# Patient Record
Sex: Female | Born: 1935 | Race: White | Hispanic: No | Smoking: Never smoker
Health system: Southern US, Community
[De-identification: ages and names within clinical notes are randomized; demographics above are authoritative.]

## PROBLEM LIST (undated history)

## (undated) DIAGNOSIS — IMO0002 Reserved for concepts with insufficient information to code with codable children: Secondary | ICD-10-CM

## (undated) DIAGNOSIS — E785 Hyperlipidemia, unspecified: Secondary | ICD-10-CM

## (undated) DIAGNOSIS — E039 Hypothyroidism, unspecified: Secondary | ICD-10-CM

## (undated) DIAGNOSIS — R32 Unspecified urinary incontinence: Secondary | ICD-10-CM

## (undated) DIAGNOSIS — C73 Malignant neoplasm of thyroid gland: Secondary | ICD-10-CM

## (undated) DIAGNOSIS — M129 Arthropathy, unspecified: Secondary | ICD-10-CM

## (undated) DIAGNOSIS — C719 Malignant neoplasm of brain, unspecified: Secondary | ICD-10-CM

## (undated) DIAGNOSIS — I6350 Cerebral infarction due to unspecified occlusion or stenosis of unspecified cerebral artery: Secondary | ICD-10-CM

## (undated) DIAGNOSIS — I10 Essential (primary) hypertension: Secondary | ICD-10-CM

## (undated) DIAGNOSIS — IMO0001 Reserved for inherently not codable concepts without codable children: Secondary | ICD-10-CM

## (undated) DIAGNOSIS — F3289 Other specified depressive episodes: Secondary | ICD-10-CM

## (undated) DIAGNOSIS — N182 Chronic kidney disease, stage 2 (mild): Secondary | ICD-10-CM

## (undated) DIAGNOSIS — K279 Peptic ulcer, site unspecified, unspecified as acute or chronic, without hemorrhage or perforation: Secondary | ICD-10-CM

## (undated) DIAGNOSIS — K219 Gastro-esophageal reflux disease without esophagitis: Secondary | ICD-10-CM

## (undated) DIAGNOSIS — F329 Major depressive disorder, single episode, unspecified: Secondary | ICD-10-CM

## (undated) HISTORY — DX: Reserved for concepts with insufficient information to code with codable children: IMO0002

## (undated) HISTORY — PX: ABDOMINAL HYSTERECTOMY: SHX81

## (undated) HISTORY — PX: BLADDER SUSPENSION: SHX72

## (undated) HISTORY — DX: Major depressive disorder, single episode, unspecified: F32.9

## (undated) HISTORY — PX: CHOLECYSTECTOMY: SHX55

## (undated) HISTORY — DX: Essential (primary) hypertension: I10

## (undated) HISTORY — DX: Peptic ulcer, site unspecified, unspecified as acute or chronic, without hemorrhage or perforation: K27.9

## (undated) HISTORY — PX: THYROIDECTOMY: SHX17

## (undated) HISTORY — DX: Hypothyroidism, unspecified: E03.9

## (undated) HISTORY — DX: Other specified depressive episodes: F32.89

## (undated) HISTORY — DX: Malignant neoplasm of thyroid gland: C73

## (undated) HISTORY — DX: Hyperlipidemia, unspecified: E78.5

## (undated) HISTORY — PX: TONSILLECTOMY: SUR1361

## (undated) HISTORY — PX: APPENDECTOMY: SHX54

## (undated) HISTORY — DX: Reserved for inherently not codable concepts without codable children: IMO0001

## (undated) HISTORY — PX: BACK SURGERY: SHX140

## (undated) HISTORY — PX: BREAST BIOPSY: SHX20

## (undated) HISTORY — DX: Gastro-esophageal reflux disease without esophagitis: K21.9

## (undated) HISTORY — PX: INCONTINENCE SURGERY: SHX676

## (undated) HISTORY — PX: WRIST SURGERY: SHX841

## (undated) HISTORY — DX: Unspecified urinary incontinence: R32

## (undated) HISTORY — DX: Cerebral infarction due to unspecified occlusion or stenosis of unspecified cerebral artery: I63.50

## (undated) HISTORY — DX: Arthropathy, unspecified: M12.9

---

## 1996-08-27 DIAGNOSIS — I6350 Cerebral infarction due to unspecified occlusion or stenosis of unspecified cerebral artery: Secondary | ICD-10-CM

## 1996-08-27 HISTORY — DX: Cerebral infarction due to unspecified occlusion or stenosis of unspecified cerebral artery: I63.50

## 1998-10-24 ENCOUNTER — Other Ambulatory Visit: Admission: RE | Admit: 1998-10-24 | Discharge: 1998-10-24 | Payer: Self-pay | Admitting: *Deleted

## 1999-07-23 ENCOUNTER — Emergency Department (HOSPITAL_COMMUNITY): Admission: EM | Admit: 1999-07-23 | Discharge: 1999-07-24 | Payer: Self-pay

## 1999-10-27 ENCOUNTER — Other Ambulatory Visit: Admission: RE | Admit: 1999-10-27 | Discharge: 1999-10-27 | Payer: Self-pay | Admitting: *Deleted

## 2000-01-10 ENCOUNTER — Ambulatory Visit (HOSPITAL_COMMUNITY): Admission: RE | Admit: 2000-01-10 | Discharge: 2000-01-10 | Payer: Self-pay | Admitting: Urology

## 2000-01-10 ENCOUNTER — Encounter: Payer: Self-pay | Admitting: Urology

## 2000-05-30 ENCOUNTER — Encounter: Admission: RE | Admit: 2000-05-30 | Discharge: 2000-05-30 | Payer: Self-pay | Admitting: Family Medicine

## 2000-05-30 ENCOUNTER — Encounter: Payer: Self-pay | Admitting: Family Medicine

## 2000-10-28 ENCOUNTER — Other Ambulatory Visit: Admission: RE | Admit: 2000-10-28 | Discharge: 2000-10-28 | Payer: Self-pay | Admitting: *Deleted

## 2000-12-06 ENCOUNTER — Encounter: Admission: RE | Admit: 2000-12-06 | Discharge: 2000-12-06 | Payer: Self-pay | Admitting: Family Medicine

## 2000-12-06 ENCOUNTER — Encounter: Payer: Self-pay | Admitting: Family Medicine

## 2002-01-07 ENCOUNTER — Other Ambulatory Visit: Admission: RE | Admit: 2002-01-07 | Discharge: 2002-01-07 | Payer: Self-pay | Admitting: *Deleted

## 2002-02-06 ENCOUNTER — Encounter: Payer: Self-pay | Admitting: Orthopedic Surgery

## 2002-02-06 ENCOUNTER — Encounter: Admission: RE | Admit: 2002-02-06 | Discharge: 2002-02-06 | Payer: Self-pay | Admitting: Orthopedic Surgery

## 2002-04-03 ENCOUNTER — Encounter: Admission: RE | Admit: 2002-04-03 | Discharge: 2002-04-03 | Payer: Self-pay | Admitting: Gastroenterology

## 2002-04-03 ENCOUNTER — Encounter: Payer: Self-pay | Admitting: Gastroenterology

## 2002-05-06 ENCOUNTER — Encounter: Payer: Self-pay | Admitting: Family Medicine

## 2002-05-06 ENCOUNTER — Encounter: Admission: RE | Admit: 2002-05-06 | Discharge: 2002-05-06 | Payer: Self-pay | Admitting: Family Medicine

## 2002-05-28 ENCOUNTER — Encounter: Payer: Self-pay | Admitting: Neurology

## 2002-05-28 ENCOUNTER — Ambulatory Visit (HOSPITAL_COMMUNITY): Admission: RE | Admit: 2002-05-28 | Discharge: 2002-05-28 | Payer: Self-pay | Admitting: Neurology

## 2003-01-13 ENCOUNTER — Other Ambulatory Visit: Admission: RE | Admit: 2003-01-13 | Discharge: 2003-01-13 | Payer: Self-pay | Admitting: Obstetrics and Gynecology

## 2003-01-26 HISTORY — PX: US ECHOCARDIOGRAPHY: HXRAD669

## 2003-01-27 HISTORY — PX: CARDIOVASCULAR STRESS TEST: SHX262

## 2005-03-21 ENCOUNTER — Other Ambulatory Visit: Admission: RE | Admit: 2005-03-21 | Discharge: 2005-03-21 | Payer: Self-pay | Admitting: *Deleted

## 2005-03-30 ENCOUNTER — Inpatient Hospital Stay (HOSPITAL_BASED_OUTPATIENT_CLINIC_OR_DEPARTMENT_OTHER): Admission: RE | Admit: 2005-03-30 | Discharge: 2005-03-30 | Payer: Self-pay | Admitting: Cardiovascular Disease

## 2005-03-30 HISTORY — PX: CARDIAC CATHETERIZATION: SHX172

## 2005-05-18 ENCOUNTER — Emergency Department (HOSPITAL_COMMUNITY): Admission: EM | Admit: 2005-05-18 | Discharge: 2005-05-18 | Payer: Self-pay | Admitting: Emergency Medicine

## 2005-09-21 ENCOUNTER — Encounter (INDEPENDENT_AMBULATORY_CARE_PROVIDER_SITE_OTHER): Payer: Self-pay | Admitting: Specialist

## 2005-09-21 ENCOUNTER — Inpatient Hospital Stay (HOSPITAL_COMMUNITY): Admission: RE | Admit: 2005-09-21 | Discharge: 2005-09-23 | Payer: Self-pay | Admitting: Obstetrics and Gynecology

## 2005-10-22 ENCOUNTER — Encounter: Admission: RE | Admit: 2005-10-22 | Discharge: 2005-10-22 | Payer: Self-pay | Admitting: Orthopedic Surgery

## 2006-10-15 ENCOUNTER — Ambulatory Visit (HOSPITAL_BASED_OUTPATIENT_CLINIC_OR_DEPARTMENT_OTHER): Admission: RE | Admit: 2006-10-15 | Discharge: 2006-10-15 | Payer: Self-pay | Admitting: Surgery

## 2006-10-15 ENCOUNTER — Encounter (INDEPENDENT_AMBULATORY_CARE_PROVIDER_SITE_OTHER): Payer: Self-pay | Admitting: *Deleted

## 2007-04-04 ENCOUNTER — Encounter: Admission: RE | Admit: 2007-04-04 | Discharge: 2007-04-04 | Payer: Self-pay | Admitting: Orthopedic Surgery

## 2007-06-30 ENCOUNTER — Inpatient Hospital Stay (HOSPITAL_COMMUNITY): Admission: RE | Admit: 2007-06-30 | Discharge: 2007-07-02 | Payer: Self-pay | Admitting: Orthopedic Surgery

## 2008-06-30 ENCOUNTER — Encounter: Payer: Self-pay | Admitting: Emergency Medicine

## 2008-06-30 ENCOUNTER — Inpatient Hospital Stay (HOSPITAL_COMMUNITY): Admission: EM | Admit: 2008-06-30 | Discharge: 2008-07-03 | Payer: Self-pay | Admitting: Internal Medicine

## 2008-08-24 ENCOUNTER — Other Ambulatory Visit: Admission: RE | Admit: 2008-08-24 | Discharge: 2008-08-24 | Payer: Self-pay | Admitting: Interventional Radiology

## 2008-08-24 ENCOUNTER — Encounter (INDEPENDENT_AMBULATORY_CARE_PROVIDER_SITE_OTHER): Payer: Self-pay | Admitting: Interventional Radiology

## 2008-08-24 ENCOUNTER — Encounter: Admission: RE | Admit: 2008-08-24 | Discharge: 2008-08-24 | Payer: Self-pay | Admitting: Surgery

## 2008-09-27 DIAGNOSIS — C73 Malignant neoplasm of thyroid gland: Secondary | ICD-10-CM

## 2008-09-27 HISTORY — DX: Malignant neoplasm of thyroid gland: C73

## 2008-10-01 ENCOUNTER — Encounter (INDEPENDENT_AMBULATORY_CARE_PROVIDER_SITE_OTHER): Payer: Self-pay | Admitting: Surgery

## 2008-10-01 ENCOUNTER — Ambulatory Visit (HOSPITAL_COMMUNITY): Admission: RE | Admit: 2008-10-01 | Discharge: 2008-10-03 | Payer: Self-pay | Admitting: Surgery

## 2008-10-15 ENCOUNTER — Encounter (HOSPITAL_COMMUNITY): Admission: RE | Admit: 2008-10-15 | Discharge: 2008-12-31 | Payer: Self-pay | Admitting: Surgery

## 2009-02-13 ENCOUNTER — Emergency Department (HOSPITAL_BASED_OUTPATIENT_CLINIC_OR_DEPARTMENT_OTHER): Admission: EM | Admit: 2009-02-13 | Discharge: 2009-02-13 | Payer: Self-pay | Admitting: Emergency Medicine

## 2009-03-16 HISTORY — PX: US ECHOCARDIOGRAPHY: HXRAD669

## 2009-04-25 ENCOUNTER — Encounter (HOSPITAL_COMMUNITY): Admission: RE | Admit: 2009-04-25 | Discharge: 2009-05-26 | Payer: Self-pay | Admitting: Surgery

## 2009-05-27 ENCOUNTER — Encounter: Admission: RE | Admit: 2009-05-27 | Discharge: 2009-05-27 | Payer: Self-pay | Admitting: Gastroenterology

## 2009-06-06 ENCOUNTER — Encounter: Admission: RE | Admit: 2009-06-06 | Discharge: 2009-06-06 | Payer: Self-pay | Admitting: Orthopedic Surgery

## 2009-08-11 ENCOUNTER — Emergency Department (HOSPITAL_COMMUNITY): Admission: EM | Admit: 2009-08-11 | Discharge: 2009-08-12 | Payer: Self-pay | Admitting: Emergency Medicine

## 2009-11-04 IMAGING — RF DG ESOPHAGUS
9 series · 18 of 18 positions shown · non-contrast
Comparison: None

CLINICAL DATA: ESOPHOGRAM/BARIUM SWALLOW
TECHNIQUE: Combined double contrast and single contrast
examination performed using effervescent crystals, thick barium
liquid, and thin barium liquid.

Fluoroscopy time:  2.3 minutes.

[Series 1: run · 3 of 3 slices shown (1 of 9)]
[im 1/3]
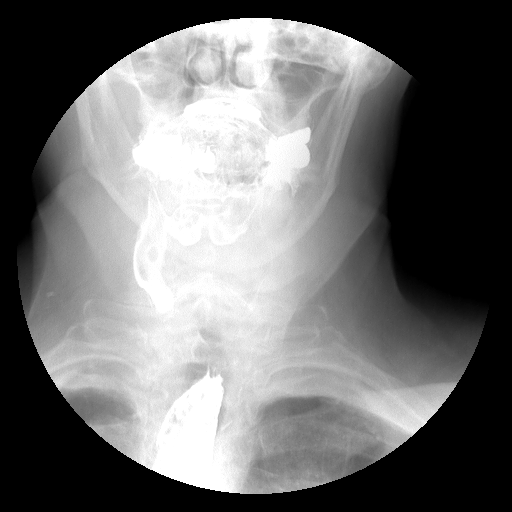
[im 2/3]
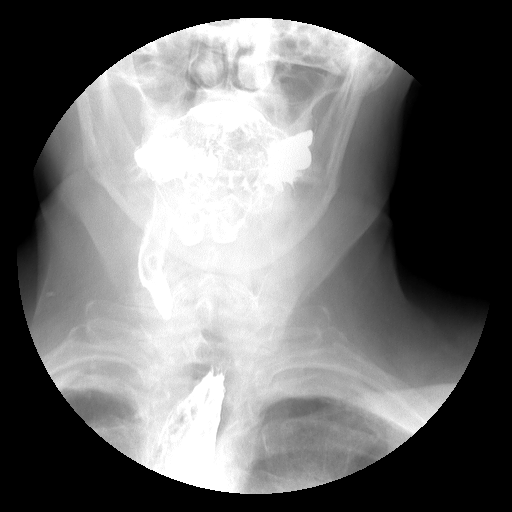
[im 3/3]
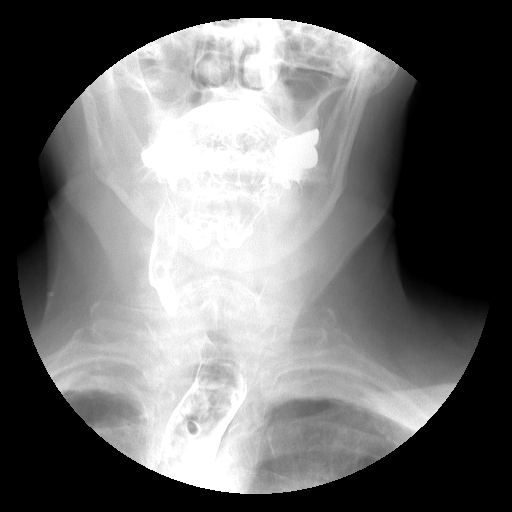

[Series 2: run · 2 of 2 slices shown (2 of 9)]
[im 1/2]
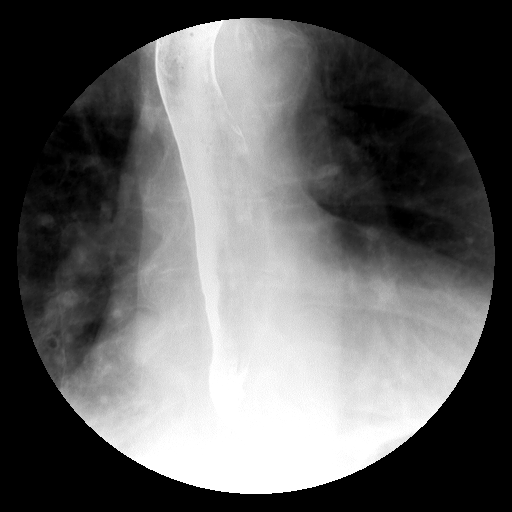
[im 2/2]
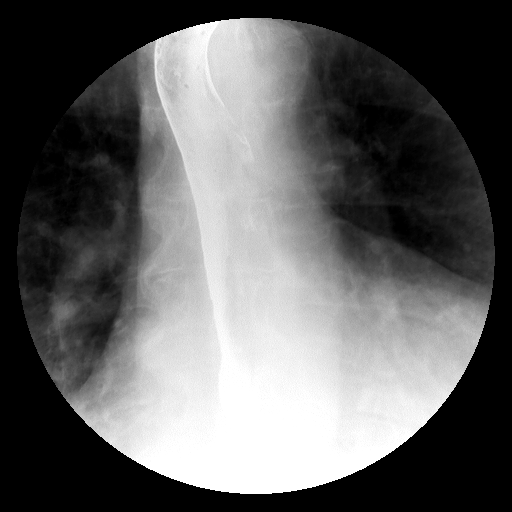

[Series 3: run · 2 of 2 slices shown (3 of 9)]
[im 1/2]
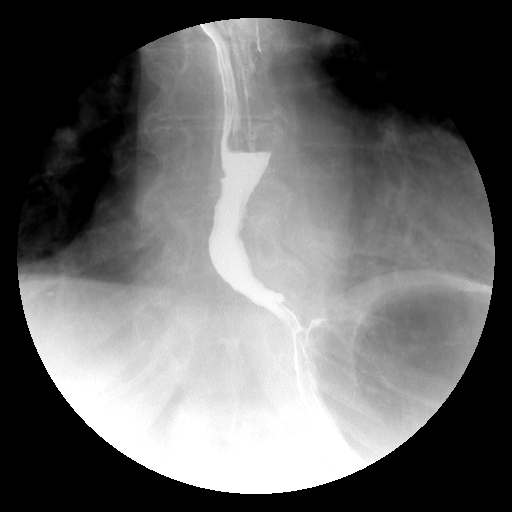
[im 2/2]
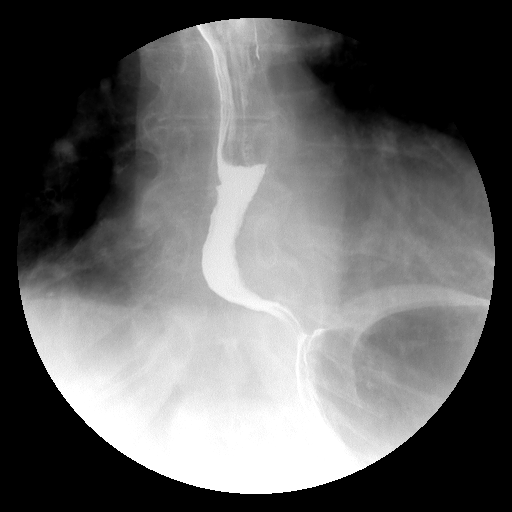

[Series 4: run · 2 of 2 slices shown (4 of 9)]
[im 1/2]
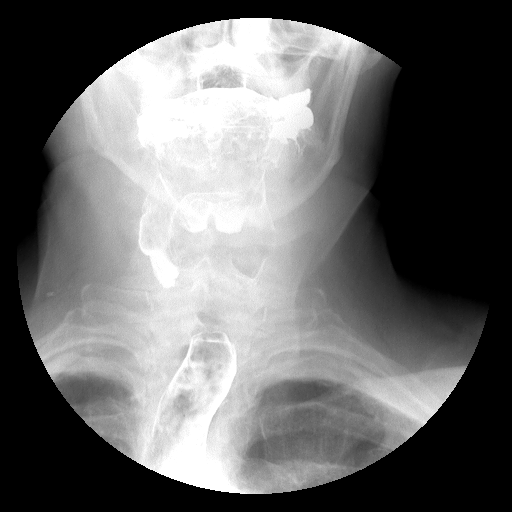
[im 2/2]
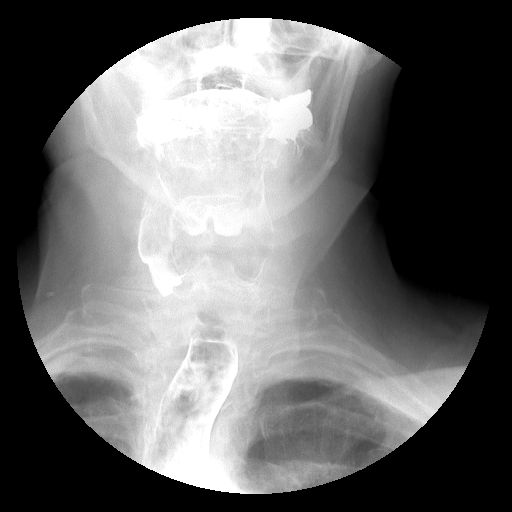

[Series 5: run · 1 of 1 slices shown (5 of 9)]
[im 1/1]
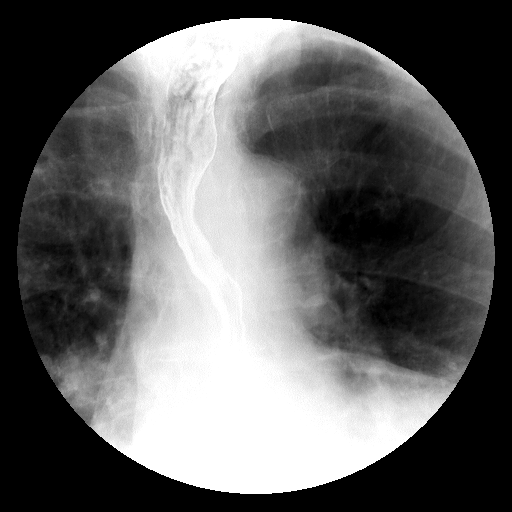

[Series 6: run · 2 of 2 slices shown (6 of 9)]
[im 1/2]
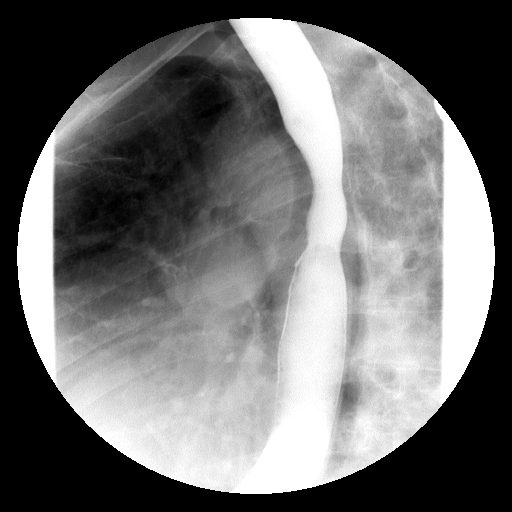
[im 2/2]
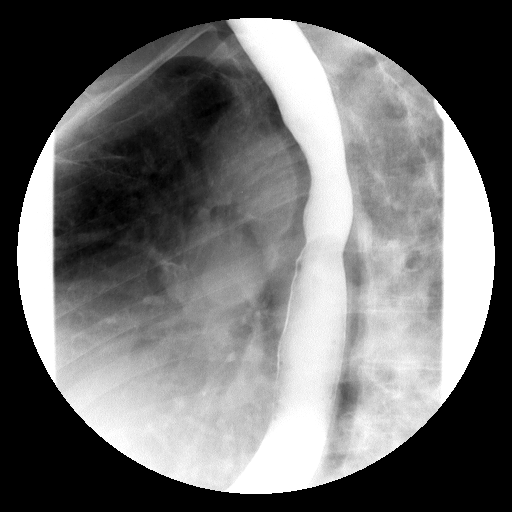

[Series 7: run · 3 of 3 slices shown (7 of 9)]
[im 1/3]
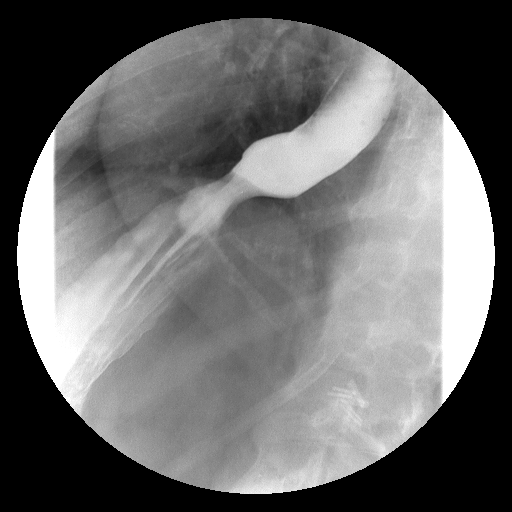
[im 2/3]
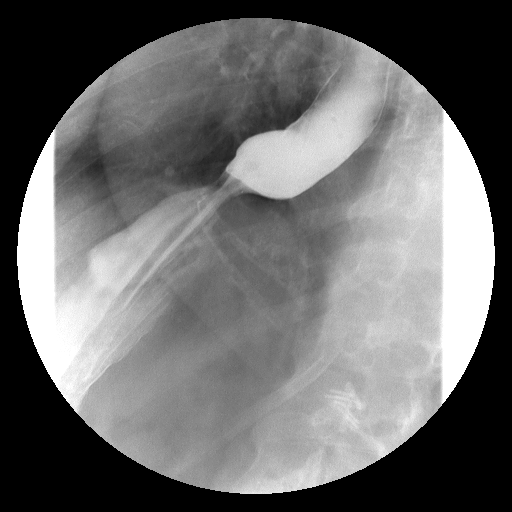
[im 3/3]
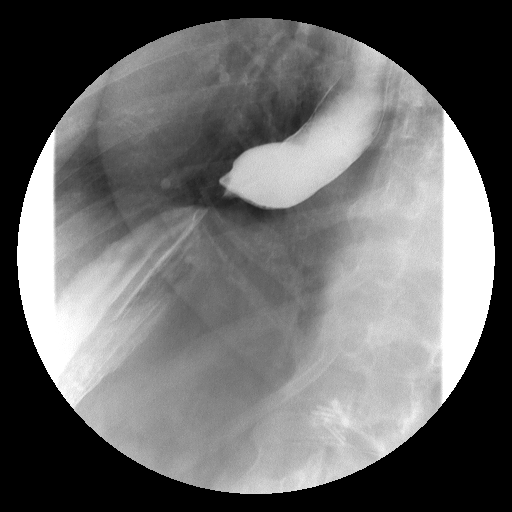

[Series 8: run · 1 of 1 slices shown (8 of 9)]
[im 1/1]
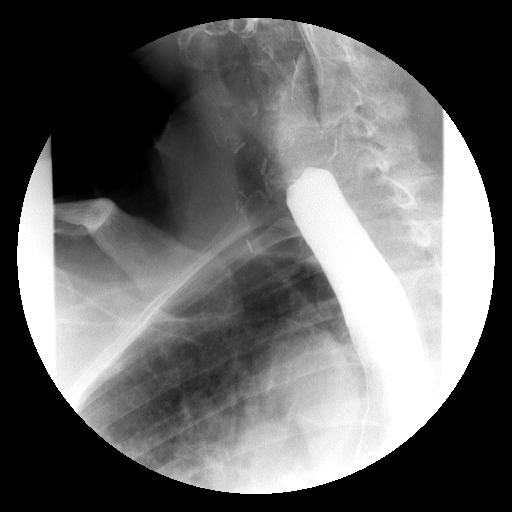

[Series 9: run · 2 of 2 slices shown (9 of 9)]
[im 1/2]
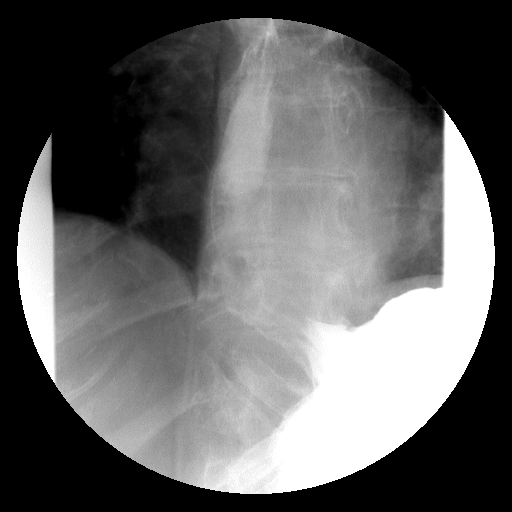
[im 2/2]
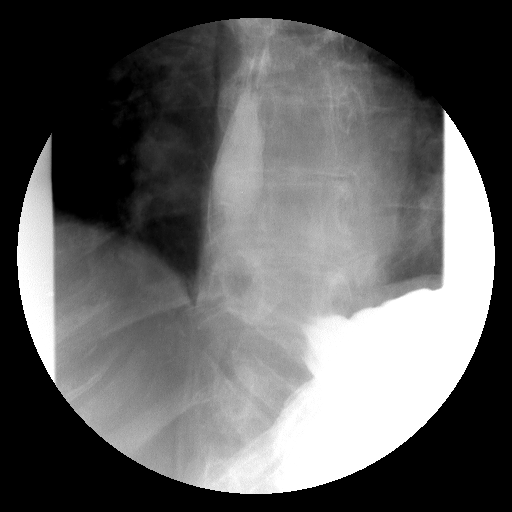

[18 of 18 positions shown; findings below may reference images not displayed]

FINDINGS: Normal esophageal motility and contours.  Primary
stripping wave is intact.  No tertiary contractions.  No esophageal
stricture.  The patient swallowed a 13 mm barium tablet in the
erect position.  Tablet traversed the esophagus and entered the
stomach without delay.There is moderate GE reflux.
IMPRESSION: GE reflux.

## 2009-11-07 ENCOUNTER — Encounter: Payer: Self-pay | Admitting: Internal Medicine

## 2009-11-07 ENCOUNTER — Inpatient Hospital Stay (HOSPITAL_COMMUNITY): Admission: EM | Admit: 2009-11-07 | Discharge: 2009-11-08 | Payer: Self-pay | Admitting: Emergency Medicine

## 2009-11-07 LAB — CONVERTED CEMR LAB
CO2: 26 meq/L
Calcium: 8.6 mg/dL
Chloride: 104 meq/L
Cholesterol: 183 mg/dL
Creatinine, Ser: 1.24 mg/dL
HCT: 43.3 %
Hemoglobin: 14.1 g/dL
Hgb A1c MFr Bld: 5.7 %
RBC: 4.58 M/uL
Sodium: 137 meq/L
Triglyceride fasting, serum: 105 mg/dL

## 2009-11-08 ENCOUNTER — Encounter: Payer: Self-pay | Admitting: Internal Medicine

## 2009-11-08 LAB — CONVERTED CEMR LAB
Basophils Relative: 1 %
CO2: 28 meq/L
Calcium: 8.9 mg/dL
Chloride: 111 meq/L
Eosinophils Relative: 2 %
Hemoglobin: 12.9 g/dL
Lymphocytes, automated: 32 %
MCV: 94.4 fL
Platelets: 156 10*3/uL
RDW: 14.1 %
Total Bilirubin: 0.8 mg/dL
WBC: 5.6 10*3/uL

## 2009-11-25 DIAGNOSIS — K279 Peptic ulcer, site unspecified, unspecified as acute or chronic, without hemorrhage or perforation: Secondary | ICD-10-CM

## 2009-11-25 HISTORY — DX: Peptic ulcer, site unspecified, unspecified as acute or chronic, without hemorrhage or perforation: K27.9

## 2009-11-30 LAB — CONVERTED CEMR LAB
HDL: 51 mg/dL
Triglyceride fasting, serum: 69 mg/dL

## 2009-12-26 ENCOUNTER — Encounter: Payer: Self-pay | Admitting: Internal Medicine

## 2009-12-26 DIAGNOSIS — I10 Essential (primary) hypertension: Secondary | ICD-10-CM

## 2009-12-27 ENCOUNTER — Ambulatory Visit: Payer: Self-pay | Admitting: Internal Medicine

## 2009-12-27 DIAGNOSIS — I6350 Cerebral infarction due to unspecified occlusion or stenosis of unspecified cerebral artery: Secondary | ICD-10-CM | POA: Insufficient documentation

## 2009-12-27 DIAGNOSIS — K279 Peptic ulcer, site unspecified, unspecified as acute or chronic, without hemorrhage or perforation: Secondary | ICD-10-CM | POA: Insufficient documentation

## 2009-12-27 DIAGNOSIS — K219 Gastro-esophageal reflux disease without esophagitis: Secondary | ICD-10-CM

## 2009-12-27 DIAGNOSIS — C73 Malignant neoplasm of thyroid gland: Secondary | ICD-10-CM

## 2009-12-27 DIAGNOSIS — R32 Unspecified urinary incontinence: Secondary | ICD-10-CM

## 2009-12-27 DIAGNOSIS — M542 Cervicalgia: Secondary | ICD-10-CM

## 2009-12-27 DIAGNOSIS — F329 Major depressive disorder, single episode, unspecified: Secondary | ICD-10-CM

## 2009-12-27 DIAGNOSIS — E039 Hypothyroidism, unspecified: Secondary | ICD-10-CM | POA: Insufficient documentation

## 2009-12-27 DIAGNOSIS — M129 Arthropathy, unspecified: Secondary | ICD-10-CM | POA: Insufficient documentation

## 2009-12-27 DIAGNOSIS — E785 Hyperlipidemia, unspecified: Secondary | ICD-10-CM

## 2009-12-29 ENCOUNTER — Encounter: Payer: Self-pay | Admitting: Internal Medicine

## 2010-02-02 ENCOUNTER — Encounter: Payer: Self-pay | Admitting: Internal Medicine

## 2010-02-13 ENCOUNTER — Encounter: Payer: Self-pay | Admitting: Internal Medicine

## 2010-03-13 ENCOUNTER — Telehealth: Payer: Self-pay | Admitting: Internal Medicine

## 2010-03-17 ENCOUNTER — Ambulatory Visit: Payer: Self-pay | Admitting: Internal Medicine

## 2010-03-17 DIAGNOSIS — R5381 Other malaise: Secondary | ICD-10-CM

## 2010-03-17 DIAGNOSIS — R5383 Other fatigue: Secondary | ICD-10-CM

## 2010-03-20 LAB — CONVERTED CEMR LAB
Basophils Absolute: 0 10*3/uL (ref 0.0–0.1)
HCT: 40.9 % (ref 36.0–46.0)
Hemoglobin: 14.2 g/dL (ref 12.0–15.0)
MCHC: 34.8 g/dL (ref 30.0–36.0)
MCV: 91.3 fL (ref 78.0–100.0)
Monocytes Relative: 7.6 % (ref 3.0–12.0)
Neutrophils Relative %: 64 % (ref 43.0–77.0)
Platelets: 184 10*3/uL (ref 150.0–400.0)
RBC: 4.48 M/uL (ref 3.87–5.11)
RDW: 13.4 % (ref 11.5–14.6)
TSH: 1.26 microintl units/mL (ref 0.35–5.50)

## 2010-03-24 ENCOUNTER — Encounter: Payer: Self-pay | Admitting: Internal Medicine

## 2010-03-24 ENCOUNTER — Telehealth: Payer: Self-pay | Admitting: Internal Medicine

## 2010-04-24 ENCOUNTER — Encounter (HOSPITAL_COMMUNITY): Admission: RE | Admit: 2010-04-24 | Discharge: 2010-05-24 | Payer: Self-pay | Admitting: Surgery

## 2010-04-28 ENCOUNTER — Encounter: Payer: Self-pay | Admitting: Internal Medicine

## 2010-05-15 ENCOUNTER — Encounter: Payer: Self-pay | Admitting: Internal Medicine

## 2010-05-15 ENCOUNTER — Telehealth: Payer: Self-pay | Admitting: Internal Medicine

## 2010-05-17 ENCOUNTER — Encounter: Payer: Self-pay | Admitting: Internal Medicine

## 2010-05-18 ENCOUNTER — Encounter: Payer: Self-pay | Admitting: Internal Medicine

## 2010-05-18 ENCOUNTER — Telehealth: Payer: Self-pay | Admitting: Internal Medicine

## 2010-05-26 ENCOUNTER — Encounter: Payer: Self-pay | Admitting: Internal Medicine

## 2010-05-31 ENCOUNTER — Ambulatory Visit: Payer: Self-pay | Admitting: Internal Medicine

## 2010-06-07 ENCOUNTER — Ambulatory Visit: Payer: Self-pay | Admitting: Cardiovascular Disease

## 2010-06-07 ENCOUNTER — Encounter: Payer: Self-pay | Admitting: Internal Medicine

## 2010-06-16 ENCOUNTER — Encounter: Payer: Self-pay | Admitting: Internal Medicine

## 2010-07-14 ENCOUNTER — Ambulatory Visit (HOSPITAL_COMMUNITY): Admission: RE | Admit: 2010-07-14 | Discharge: 2010-07-14 | Payer: Self-pay | Admitting: Neurological Surgery

## 2010-07-26 ENCOUNTER — Telehealth: Payer: Self-pay | Admitting: Internal Medicine

## 2010-07-28 ENCOUNTER — Encounter: Payer: Self-pay | Admitting: Internal Medicine

## 2010-08-08 ENCOUNTER — Encounter: Payer: Self-pay | Admitting: Internal Medicine

## 2010-08-09 ENCOUNTER — Telehealth: Payer: Self-pay | Admitting: Internal Medicine

## 2010-09-13 ENCOUNTER — Encounter: Payer: Self-pay | Admitting: Internal Medicine

## 2010-09-15 ENCOUNTER — Ambulatory Visit
Admission: RE | Admit: 2010-09-15 | Discharge: 2010-09-15 | Payer: Self-pay | Source: Home / Self Care | Attending: Internal Medicine | Admitting: Internal Medicine

## 2010-09-17 ENCOUNTER — Encounter: Payer: Self-pay | Admitting: Orthopedic Surgery

## 2010-09-18 ENCOUNTER — Encounter: Payer: Self-pay | Admitting: Endocrinology

## 2010-09-27 ENCOUNTER — Encounter: Payer: Self-pay | Admitting: Neurological Surgery

## 2010-09-28 NOTE — Letter (Signed)
Summary: [facility]   Imported By: Sherian Rein 08/10/2010 14:04:49  _____________________________________________________________________  External Attachment:    Type:   Image     Comment:   External Document

## 2010-09-28 NOTE — Consult Note (Signed)
Summary: [facility]   Imported By: Sherian Rein 02/22/2010 14:53:16  _____________________________________________________________________  External Attachment:    Type:   Image     Comment:   External Document

## 2010-09-28 NOTE — Progress Notes (Signed)
Summary: Pain/Voltaren  Phone Note Call from Patient Call back at Home Phone [phone]   Summary of Call: Patient and her daughter called the office stating that the patient is "sick". Patient c/o rib, back, arthritis, and restless leg pains and was recently given Voltaren by another MD. Patient called their office an hour ago. I made patient aware that our office cannot do anything over the phone other than offer office visit and she should await call from her other MD who provided the med. Patient and her daughter expressed understanding and will call back if needed. Initial call taken by: Lucious Groves CMA,  March 13, 2010 2:34 PM  Follow-up for Phone Call        ok, noted - will address as needed if pt needs after feedback from prescribing MD (i do not have this info) - thanks Follow-up by: Newt Lukes MD,  March 13, 2010 4:00 PM

## 2010-09-28 NOTE — Assessment & Plan Note (Signed)
Summary: STIFF NECK--D/T---STC   Vital Signs:  Patient profile:   77 year old female Height:      63 inches (160.02 cm) Weight:      154.8 pounds (70.36 kg) O2 Sat:      97 % on Room air Temp:     98.3 degrees F (36.83 degrees C) oral Pulse rate:   59 / minute BP sitting:   122 / 72  (left arm) Cuff size:   regular  Vitals Entered By: Orlan Leavens RMA (September 15, 2010 11:28 AM)  O2 Flow:  Room air CC: Stiff neck Is Patient Diabetic? No   Primary Care Provider:  Newt Lukes MD  CC:  Stiff neck.  History of Present Illness: here for depression - c/o tearfulness and "anxiety"/nervousness  head in "fog" and feels forgetful -  a/w excess physical fatigue and irregular sleep - +hx same, now agreeable to med trial as dtr's urging  reviewe chronic med issues 1) dyslipidemia - reports compliance with ongoing medical treatment and no changes in medication dose or frequency. denies adverse side effects related to current therapy. no GI or muscle c/o  2) c/o ongoing chronic neck pain - sees ortho and nsurg for same - told the pain is due to arthritis and bone spurs no radiation into arms but feels "tight" - pain causes difficulty with ADLs and sleep position in bed no balance problems or falls pain partially relieved by Indocin buit "never goes away and always comes back" now uses tramadol combo as needed - ?planning steroid shot with nsurg  3) hx thyroid ca s/p resection 09/2008 for same - subsquent post surgical hypothyroidism -  follows with endo and surg for same - no recent dose changes in thyroid meds no hoarsness or dysphagia, no weight loss - planning for nuc study in 04/2010 per surg ov note 02/02/10  4) GERD with gastritis - no changes in meds - no current reflux symptoms or abd pain.  prior GI eval revealed gastrtitis (EGD 12/05/09)  CP eval (24h hosp 10/2009) - no recurrent CP, on twice daily PPI - cont nausea and constipation  Clinical Review Panels:  CBC   WBC:   5.0 (03/17/2010)   RBC:  4.48 (03/17/2010)   Hgb:  14.2 (03/17/2010)   Hct:  40.9 (03/17/2010)   Platelets:  184.0 (03/17/2010)   MCV  91.3 (03/17/2010)   MCHC  34.8 (03/17/2010)   RDW  13.4 (03/17/2010)   PMN:  64.0 (03/17/2010)   Lymphs:  24.5 (03/17/2010)   Monos:  7.6 (03/17/2010)   Eosinophils:  3.1 (03/17/2010)   Basophil:  0.8 (03/17/2010)  Complete Metabolic Panel   Glucose:  97 (11/08/2009)   Sodium:  144 (11/08/2009)   Potassium:  4.1 (11/08/2009)   Chloride:  111 (11/08/2009)   CO2:  28 (11/08/2009)   BUN:  11 (11/08/2009)   Creatinine:  0.97 (11/08/2009)   Albumin:  3.2 (11/08/2009)   Total Protein:  5.4 (11/08/2009)   Calcium:  8.9 (11/08/2009)   Total Bili:  0.8 (11/08/2009)   Alk Phos:  62 (11/08/2009)   SGPT (ALT):  15 (11/08/2009)   SGOT (AST):  16 (11/08/2009)   Current Medications (verified): 1)  Pantoprazole Sodium 40 Mg Tbec (Pantoprazole Sodium) .... Take 1 Two Times A Day 2)  Plavix 75 Mg Tabs (Clopidogrel Bisulfate) .... Take 1 By Mouth Qd 3)  Synthroid 88 Mcg Tabs (Levothyroxine Sodium) .... Take 1 By Mouth Once Daily 4)  Tramadol-Acetaminophen 37.5-325 Mg Tabs (  Tramadol-Acetaminophen) .... Take 1 By Mouth Once Daily As Needed For Back Pain 5)  Indomethacin 25 Mg Caps (Indomethacin) .... Take 2 By Mouth Once Daily 6)  Aspirin 325 Mg Tabs (Aspirin) .... Take 1/2 By Mouth Once Daily 7)  Pravachol 40 Mg Tabs (Pravastatin Sodium) .... Take 2 By Mouth Qd 8)  Alprazolam 0.5 Mg Tabs (Alprazolam) .... Take 1 By Mouth As Needed 9)  Methocarbamol 500 Mg Tabs (Methocarbamol) .... Take 1-2 By Mouth Once Daily As Needed 10)  Promethazine Hcl 12.5 Mg Tabs (Promethazine Hcl) .Marland Kitchen.. 1 By Mouth Every 8 Hours As Needed For Nausea  Allergies (verified): 1)  ! 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  The patient denies anorexia, weight loss, syncope, peripheral edema, and headaches.    Physical Exam  General:  alert, well-developed, well-nourished, and cooperative to examination.   dtr at side Lungs:  normal respiratory effort, no intercostal retractions or use of accessory muscles; normal breath sounds bilaterally - no crackles and no wheezes.    Heart:  normal rate, regular rhythm, no murmur, and no rub. BLE without edema.  Psych:  Oriented X3, memory intact for recent and remote, normally interactive, good eye contact, mod anxious appearing, very depressed appearing, tearful   Impression & Recommendations:  Problem # 1:  DEPRESSION (ICD-311)  Her updated medication list for this problem includes:    Alprazolam 0.5 Mg Tabs (Alprazolam) .Marland Kitchen... Take 1 by mouth as needed    Budeprion Xl 150 Mg Xr24h-tab (Bupropion hcl) .Marland Kitchen... 1 by mouth once daily for depression symptoms  situational, exac by fustration about pain start wellbutrin and ok to cont to use low dose bz as needed  f/u 6 weeks to review, call sooner if probs - pt/dtr understand same and agree  Orders: Prescription Created Electronically [phone])  Problem # 2:  CERVICALGIA (ICD-723.1)  The following medications were removed from the medication list:    Tramadol-acetaminophen 37.5-325 Mg Tabs (Tramadol-acetaminophen) .Marland Kitchen... Take 1 by mouth once daily as needed for back pain Her updated medication list for this problem includes:    Indomethacin 25 Mg Caps (Indomethacin) .Marland Kitchen... Take 2 by mouth once daily    Aspirin 325 Mg Tabs (Aspirin) .Marland Kitchen... Take 1/2 by mouth once daily    Methocarbamol 500 Mg Tabs (Methocarbamol) .Marland Kitchen... Take 1-2 by mouth once daily as needed     Tramadol-acetaminophen 37.5-325 Mg Tabs (Tramadol-acetaminophen) .Marland Kitchen... 1 by mouth twice daily as needed for pain  related to osteophytes and DDD - has seen general ortho and nsurg for same prior xray and films done, not repeated today due to no change in nature of symptoms  Complete Medication List: 1)  Pantoprazole Sodium 40 Mg Tbec (Pantoprazole sodium) .... Take 1 two times a day 2)  Plavix 75 Mg Tabs (Clopidogrel bisulfate) .... Take 1 by mouth qd 3)  Synthroid 88 Mcg Tabs (Levothyroxine sodium) .... Take 1 by mouth once daily 4)  Indomethacin 25 Mg Caps (Indomethacin) .... Take 2 by mouth once daily 5)  Aspirin 325 Mg Tabs (Aspirin) .... Take 1/2 by mouth once daily  6)  Pravachol 40 Mg Tabs (Pravastatin sodium) .... Take 2 by mouth qd 7)  Alprazolam 0.5 Mg Tabs (Alprazolam) .... Take 1 by mouth as needed 8)  Methocarbamol 500 Mg Tabs (Methocarbamol) .... Take 1-2 by mouth once daily as needed 9)  Promethazine Hcl 12.5 Mg Tabs (Promethazine hcl) .Marland Kitchen.. 1 by mouth every 8 hours as needed for nausea 10)  Tramadol-acetaminophen 37.5-325 Mg Tabs (Tramadol-acetaminophen) .Marland Kitchen.. 1 by mouth twice daily as needed for pain 11)  Budeprion Xl 150 Mg Xr24h-tab (Bupropion hcl) .Marland Kitchen.. 1 by mouth once daily for depression symptoms  Patient Instructions: 1)  it was good to see you today. 2)  start generic wellbutrin for depression symptoms - take one pill everyday 3)  refills on other medications as discussed-  4)  your prescriptions have been electronically submitted to your pharmacy. Please take as directed. Contact our office if you believe you're having problems with the medication(s).  5)  Please schedule a follow-up appointment in 6 weeks to review depression, call sooner if problems.  Prescriptions: ALPRAZOLAM 0.5 MG TABS (ALPRAZOLAM) take 1 by mouth as needed  #90 x 0   Entered and Authorized by:   Newt Lukes MD   Signed by:   Newt Lukes MD on 09/15/2010   Method used:   Printed  then faxed to ...       [id number] (retail)       [address]       [place], [place]  [address]       Ph: [phone] or [phone]       Fax: [phone]   RxID:   [id number] PLAVIX 75 MG TABS (CLOPIDOGREL BISULFATE) take 1 by mouth qd  #30 x 1   Entered and Authorized by:   Newt Lukes MD   Signed by:   Newt Lukes MD on 09/15/2010   Method used:   Electronically to        [facility] [phone]* (retail)       [address]       [place], [place]  [address]       Ph: [phone] or [phone]       Fax: [phone]   RxID:   [id number] BUDEPRION XL 150 MG XR24H-TAB (BUPROPION HCL) 1 by mouth once daily for depression symptoms  #30 x 3   Entered and Authorized by:   Newt Lukes MD   Signed by:   Newt Lukes MD on 09/15/2010   Method used:   Electronically to        [facility] [phone][facility] (retail)       [address]       [place], [place]  [address]       Ph: [phone] or [phone]       Fax: [phone]   RxID:   [id number]    Orders Added: 1)  Est. Patient Level IV [78938] 2)  Prescription Created Electronically [phone]

## 2010-09-28 NOTE — Letter (Signed)
Summary: [facility]   Imported By: Sherian Rein 01/02/2010 09:53:40  _____________________________________________________________________  External Attachment:    Type:   Image     Comment:   External Document

## 2010-09-28 NOTE — Letter (Signed)
Summary: [facility]   Imported By: Lennie Odor 06/19/2010 11:29:43  _____________________________________________________________________  External Attachment:    Type:   Image     Comment:   External Document

## 2010-09-28 NOTE — Assessment & Plan Note (Signed)
Summary: pt wants to a f/u appt per pt/#/cd   Vital Signs:  Patient profile:   76 year old female Height:      63 inches (160.02 cm) Weight:      166 pounds (75.45 kg) O2 Sat:      96 % on Room air Temp:     97.8 degrees F (36.56 degrees C) oral Pulse rate:   51 / minute BP sitting:   132 / 82  (left arm) Cuff size:   regular  Vitals Entered By: Orlan Leavens (March 17, 2010 11:09 AM)  O2 Flow:  Room air CC: follow-up visit Is Patient Diabetic? No Pain Assessment Patient in pain? no      Comments Req 90 rx for palvix, indometh, zolpidem and alprazolam to send to [facility]. also pt states she wast a local supply of alprazolam she is completely out   Primary Care Provider:  Newt Lukes MD  CC:  follow-up visit.  History of Present Illness: here for followup -  1) dyslipidemia - reports compliance with ongoing medical treatment and no changes in medication dose or frequency. denies adverse side effects related to current therapy. no GI or muscle c/o  2) c/o ongoing chronic neck pain - sees ortho and nsurg for same - told the pain is due to arthritis and bone spurs, no radiation into arms but feels "tight" - pain causes difficulty with ADLs and sleep position in bed no balance problems or falls pain partially relieved by Indocin buit "never goes away and always comes back" tried Voltaren gel - use in 3 different sites at same time- felt "sick" so stopped tx  3) hx thyroid ca s/p resection 09/2008 for same - subsquent post surgical hypothyroidism -  continue to follow with endo and surg for same - no recent dose changes in thyroid meds no hoarsness or dysphagia, no weight loss - planning for nuc study in 04/2010 per surg ov note 02/02/10  4) GERD with gastritis - no changes in meds - no current reflux symptoms or abd pain.  prior GI eval revealed gastrtitis (EGD 12/05/09)  CP eval (24h hosp 10/2009) - no recurrent CP, on twice daily PPI  Clinical Review  Panels:  Immunizations   Last Tetanus Booster:  Historical (08/27/2008)   Last Flu Vaccine:  Historical (05/27/2009)   Last Pneumovax:  Historical (05/27/2009)  Lipid Management   Cholesterol:  178 (11/30/2009)   LDL (bad choesterol):  113 (11/30/2009)   HDL (good cholesterol):  51 (11/30/2009)   Triglycerides:  69 (11/30/2009)  CBC   WBC:  5.6 (11/08/2009)   RBC:  4.18 (11/08/2009)   Hgb:  12.9 (11/08/2009)   Hct:  39.5 (11/08/2009)   Platelets:  156 (11/08/2009)   MCV  94.4 (11/08/2009)   RDW  14.1 (11/08/2009)   PMN:  59 (11/08/2009)   Monos:  6 (11/08/2009)   Eosinophils:  2 (11/08/2009)   Basophil:  1 (11/08/2009)  Complete Metabolic Panel   Glucose:  97 (11/08/2009)   Sodium:  144 (11/08/2009)   Potassium:  4.1 (11/08/2009)   Chloride:  111 (11/08/2009)   CO2:  28 (11/08/2009)   BUN:  11 (11/08/2009)   Creatinine:  0.97 (11/08/2009)   Albumin:  3.2 (11/08/2009)   Total Protein:  5.4 (11/08/2009)   Calcium:  8.9 (11/08/2009)   Total Bili:  0.8 (11/08/2009)   Alk Phos:  62 (11/08/2009)   SGPT (ALT):  15 (11/08/2009)   SGOT (AST):  16 (11/08/2009)  Current Medications (verified): 1)  Pantoprazole Sodium 40 Mg Tbec (Pantoprazole Sodium) .... Take 1 Two Times A Day 2)  Plavix 75 Mg Tabs (Clopidogrel Bisulfate) .... Take 1 By Mouth Qd 3)  Synthroid 88 Mcg Tabs (Levothyroxine Sodium) .... Take 1 By Mouth Once Daily 4)  Tramadol-Acetaminophen 37.5-325 Mg Tabs (Tramadol-Acetaminophen) .... Take 1 By Mouth Once Daily As Needed For Back Pain 5)  Indomethacin 25 Mg Caps (Indomethacin) .... Take 2 By Mouth Once Daily As Needed 6)  Aspirin 325 Mg Tabs (Aspirin) .... Take 1/2 By Mouth Once Daily 7)  Pravachol 40 Mg Tabs (Pravastatin Sodium) .... Take 2 By Mouth Qd 8)  Zolpidem Tartrate 10 Mg Tabs (Zolpidem Tartrate) .... Take 1/2 At Bedtime 9)  Alprazolam 0.5 Mg Tabs (Alprazolam) .... Take 1 By Mouth As Needed 10)  Methocarbamol 500 Mg Tabs (Methocarbamol) .... Take 1-2  By Mouth Once Daily As Needed  Allergies (verified): 1)  ! Iodine  Past History:  Past Medical History: Hypertension Depression GERD with Peptic ulcer disease hx; gastritis on EGD 12/06/09 Cerebrovascular accident, hx of thyroid cancer (papillary) s/p total thyroidectomy 09/2008 hypothyroid, postsurgical arthritis -DDD cervical spine, knees  MD roster: cards - [facility] - [facility] surg - [facility] ortho - gioffre endo - balan nsurg - elsner  Review of Systems  The patient denies fever, weight loss, syncope, headaches, and abdominal pain.    Physical Exam  General:  alert, well-developed, well-nourished, and cooperative to examination.    Lungs:  normal respiratory effort, no intercostal retractions or use of accessory muscles; normal breath sounds bilaterally - no crackles and no wheezes.    Heart:  normal rate, regular rhythm, no murmur, and no rub. BLE without edema.  Msk:  back: full range of motion of lumbar spine. Nontender to palpation. Negative straight leg raise. Deep tendon reflexes symmetrically intact at Achilles and patella, negative clonus. Sensation intact throughout all dermatomes in bilateral lower extremities. Full strength to manual muscle testing in all major muscule groups including EHL, anterior tibialis, gastrocnemius, quadriceps, and iliopsoas. Able to heel and toe walk without difficulty and ambulates with a normal gait.    Impression & Recommendations:  Problem # 1:  FATIGUE (ICD-780.79) nonsp hx and exam - check labs now - reassurance provided - cont med mgmt of [facility] pain issues with robaxin (rx'd but not yet started), PT and ok to try tonic water for legs Orders: TLB-CBC Platelet - w/Differential (85025-CBCD) TLB-TSH (Thyroid Stimulating Hormone) (84443-TSH)  Problem # 2:  HYPOTHYROIDISM (ICD-244.9)  Her updated medication list for this problem includes:    Synthroid 88 Mcg Tabs (Levothyroxine sodium) .Marland Kitchen... Take 1 by mouth once  daily  Orders: TLB-TSH (Thyroid Stimulating Hormone) [phone])  s/p total thyroidectomy 09/2008 for cancer -  follow with endo ([facility]) for same -  Labs Reviewed: TSH: 3.310 (11/07/2009)    HgBA1c: 5.7 (11/07/2009) Chol: 178 (11/30/2009)   HDL: 51 (11/30/2009)   LDL: 113 (11/30/2009)   TG: 69 (11/30/2009)  Problem # 3:  ARTHRITIS (ICD-716.90)  neck and knees - cont pain meds, not voltaren gel, and to see ortho/pt/nsurg as ongoing  Problem # 4:  DEPRESSION (ICD-311) situational - ok to use low dose bz as needed  Her updated medication list for this problem includes:    Alprazolam 0.5 Mg Tabs (Alprazolam) .Marland Kitchen... Take 1 by mouth as needed  Complete Medication List: 1)  Pantoprazole Sodium 40 Mg Tbec (Pantoprazole sodium) .... Take 1 two times a day 2)  Plavix 75  Mg Tabs (Clopidogrel bisulfate) .... Take 1 by mouth qd 3)  Synthroid 88 Mcg Tabs (Levothyroxine sodium) .... Take 1 by mouth once daily 4)  Tramadol-acetaminophen 37.5-325 Mg Tabs (Tramadol-acetaminophen) .... Take 1 by mouth once daily as needed for back pain 5)  Indomethacin 25 Mg Caps (Indomethacin) .... Take 2 by mouth once daily as needed 6)  Aspirin 325 Mg Tabs (Aspirin) .... Take 1/2 by mouth once daily 7)  Pravachol 40 Mg Tabs (Pravastatin sodium) .... Take 2 by mouth qd 8)  Zolpidem Tartrate 10 Mg Tabs (Zolpidem tartrate) .... Take 1/2 at bedtime 9)  Alprazolam 0.5 Mg Tabs (Alprazolam) .... Take 1 by mouth as needed 10)  Methocarbamol 500 Mg Tabs (Methocarbamol) .... Take 1-2 by mouth once daily as needed  Patient Instructions: 1)  it was good to see you  2)  no more voltaren gel 3)  try tonic water with lime at bedtime (4-8 oz) for leg cramps - 4)  test(s) ordered today - your results will be posted on the phone tree for review in 48-72 hours from the time of test completion; call [phone] and enter your 9 digit MRN (listed above on this page, just below your name); if any changes need to be made or there  are abnormal results, you will be contacted directly.  5)  use the methacarbamol for muscle spasms to help neck and leg pain - 6)  refills on medications as discussed - 7)  stay hydrated and out of the heat - the heat and humidity can make you feel bad and dehydrate you very quickly - 8)  Please schedule a follow-up appointment in 4 months, sooner if problems.  Prescriptions: ZOLPIDEM TARTRATE 10 MG TABS (ZOLPIDEM TARTRATE) take 1/2 at bedtime  #30 x 0   Entered by:   Orlan Leavens   Authorized by:   Newt Lukes MD   Signed by:   Orlan Leavens on 03/17/2010   Method used:   Print then Give to Patient   RxID:   [id number] ALPRAZOLAM 0.5 MG TABS (ALPRAZOLAM) take 1 by mouth as needed  #90 x 0   Entered by:   Orlan Leavens   Authorized by:   Newt Lukes MD   Signed by:   Orlan Leavens on 03/17/2010   Method used:   Print then Give to Patient   RxID:   [id number] ZOLPIDEM TARTRATE 10 MG TABS (ZOLPIDEM TARTRATE) take 1/2 at bedtime  #90 x 0   Entered by:   Orlan Leavens   Authorized by:   Newt Lukes MD   Signed by:   Orlan Leavens on 03/17/2010   Method used:   Print then Give to Patient   RxID:   [id number] INDOMETHACIN 25 MG CAPS (INDOMETHACIN) take 2 by mouth once daily as needed  #90 x 1   Entered by:   Orlan Leavens   Authorized by:   Newt Lukes MD   Signed by:   Orlan Leavens on 03/17/2010   Method used:   Faxed to ...       [facility]* (mail-order)       [address]       [place], [place]  [address]       Ph: [phone]       Fax: [phone]   RxID:   [id number] PLAVIX 75 MG TABS (CLOPIDOGREL BISULFATE) take 1 by mouth qd  #90 x 1   Entered by:   Orlan Leavens  Authorized by:   Newt Lukes MD   Signed by:   Orlan Leavens on 03/17/2010   Method used:   Faxed to ...       [facility]* (mail-order)       [address]       [place], [place]  [address]       Ph: [phone]       Fax: [phone]    RxID:   [id number]

## 2010-09-28 NOTE — Letter (Signed)
Summary: [facility]  [facility]   Imported By: Lester Wittenberg 06/29/2010 10:39:54  _____________________________________________________________________  External Attachment:    Type:   Image     Comment:   External Document

## 2010-09-28 NOTE — Medication Information (Signed)
Summary: Medical Clarification/[facility]  Medical Clarification/[facility]   Imported By: Sherian Rein 03/27/2010 09:38:08  _____________________________________________________________________  External Attachment:    Type:   Image     Comment:   External Document

## 2010-09-28 NOTE — Assessment & Plan Note (Signed)
Summary: NEW MEDICARE PT--PKG--STC PRESSED "1" TO CX/ LMOM TO CONFIRM/NWS   Vital Signs:  Patient profile:   77 year old female Height:      63 inches (160.02 cm) Weight:      166.0 pounds (75.45 kg) BMI:     29.51 O2 Sat:      93 % on Room air Temp:     98.7 degrees F (37.06 degrees C) oral Pulse rate:   62 / minute BP sitting:   132 / 72  (left arm) Cuff size:   regular  Vitals Entered By: Orlan Leavens (Dec 27, 2009 9:52 AM)  O2 Flow:  Room air CC: New patient Is Patient Diabetic? No Pain Assessment Patient in pain? no        Primary Care Provider:  Newt Lukes MD  CC:  New patient.  History of Present Illness: new pt to me and our practice, here to est care  1) recent hosp 3/14-15 for CP - neg overnight cardiac eval -negative - has also seen cards since hosp as OP OV - subsquent OP f/u with GI revealed gastrtitis (EGD 12/05/09) -  no recurrent CP, on twice daily PPI  2) dyslipidemia - reports compliance with ongoing medical treatment and no changes in medication dose or frequency. denies adverse side effects related to current therapy. no GI or muscle c/o  3) c/o ongoing chronic neck pain - sees ortho for same - told the pain is due to arthritis and bone spurs, no radiation into arms but feels "tight" - pain causes difficulty with ADLs and sleep position in bed no balance problems or falls pain partially relieved by Indocin buit "never goes away and always comes back"  4) hx thyroid ca s/p resection 09/2008 for same - continue to follow with endo and surg for same - no recent dose changes in thyroid meds no hoarsness or dysphagia, no weight loss  5) GERD with gastritis - see #1 above - no changes in meds - no current reflux symptoms or abd pain  Preventive Screening-Counseling & Management  Alcohol-Tobacco     Alcohol drinks/day: 0     Alcohol Counseling: not indicated; patient does not drink     Smoking Status: never     Tobacco Counseling: not  indicated; no tobacco use  Caffeine-Diet-Exercise     Caffeine Counseling: not indicated; caffeine use is not excessive or problematic     Nutrition Referrals: no     Exercise Counseling: not indicated; exercise is adequate     Depression Counseling: not indicated; screening negative for depression  Safety-Violence-Falls     Seat Belt Counseling: not indicated; patient wears seat belts     Helmet Counseling: not indicated; patient wears helmet when riding bicycle/motocycle     Firearm Counseling: not indicated; uses recommended firearm safety measures     Smoke Detector Counseling: n/a     Violence Counseling: not indicated; no violence risk noted     Fall Risk Counseling: not indicated; no significant falls noted  Clinical Review Panels:  Immunizations   Last Tetanus Booster:  Historical (08/27/2008)   Last Flu Vaccine:  Historical (05/27/2009)   Last Pneumovax:  Historical (05/27/2009)  Lipid Management   Cholesterol:  178 (11/30/2009)   LDL (bad choesterol):  113 (11/30/2009)   HDL (good cholesterol):  51 (11/30/2009)   Triglycerides:  69 (11/30/2009)  Diabetes Management   HgBA1C:  5.7 (11/07/2009)   Creatinine:  0.97 (11/08/2009)   Last Flu Vaccine:  Historical (05/27/2009)   Last Pneumovax:  Historical (05/27/2009)  CBC   WBC:  5.6 (11/08/2009)   RBC:  4.18 (11/08/2009)   Hgb:  12.9 (11/08/2009)   Hct:  39.5 (11/08/2009)   Platelets:  156 (11/08/2009)   MCV  94.4 (11/08/2009)   RDW  14.1 (11/08/2009)   PMN:  59 (11/08/2009)   Monos:  6 (11/08/2009)   Eosinophils:  2 (11/08/2009)   Basophil:  1 (11/08/2009)  Complete Metabolic Panel   Glucose:  97 (11/08/2009)   Sodium:  144 (11/08/2009)   Potassium:  4.1 (11/08/2009)   Chloride:  111 (11/08/2009)   CO2:  28 (11/08/2009)   BUN:  11 (11/08/2009)   Creatinine:  0.97 (11/08/2009)   Albumin:  3.2 (11/08/2009)   Total Protein:  5.4 (11/08/2009)   Calcium:  8.9 (11/08/2009)   Total Bili:  0.8 (11/08/2009)    Alk Phos:  62 (11/08/2009)   SGPT (ALT):  15 (11/08/2009)   SGOT (AST):  16 (11/08/2009)   -  Date:  11/30/2009    Cholesterol: 178    LDL: 113    HDL: 51    Triglycerides: 69  Current Medications (verified): 1)  Pantoprazole Sodium 40 Mg Tbec (Pantoprazole Sodium) .... Take 1 Two Times A Day 2)  Plavix 75 Mg Tabs (Clopidogrel Bisulfate) .... Take 1 By Mouth Qd 3)  Synthroid 88 Mcg Tabs (Levothyroxine Sodium) .... Take 1 By Mouth Once Daily 4)  Tramadol-Acetaminophen 37.5-325 Mg Tabs (Tramadol-Acetaminophen) .... Take 1 By Mouth Once Daily As Needed For Back Pain 5)  Indomethacin 25 Mg Caps (Indomethacin) .... Take 2 By Mouth Once Daily As Needed 6)  Aspirin 325 Mg Tabs (Aspirin) .... Take 1/2 By Mouth Once Daily 7)  Pravachol 40 Mg Tabs (Pravastatin Sodium) .... Take 2 By Mouth Qd 8)  Zolpidem Tartrate 10 Mg Tabs (Zolpidem Tartrate) .... Take 1/2 At Bedtime  Allergies (verified): 1)  ! Iodine  Past History:  Past Medical History: Hypertension Depression GERD with Peptic ulcer disease hx; gastritis on EGD 12/06/09 Cerebrovascular accident, hx of thyroid cancer (pappilary) s/p total thyroidectomy 09/2008 hypothyroid, postsurgical arthritis -DDD cervical spine, knees  MD rooster: [facility] - [facility] - [facility] - [facility] - [facility] - [facility]  Past Surgical History: Cholecystectomy Hysterectomy Thyroidectomy (2/30/10) Breast biopsy Back surgery   Family History: Family History of Arthritis (parent, other relative) Family History Breast cancer 1st degree relative <50 (grandparent) Family History Diabetes 1st degree relative (parent) Family History High cholesterol (parent)  Social History: Never Smoked, no alcohol widowed, lives with dtr who is an addict retired, enjoys gardeningSmoking Status:  never  Review of Systems       see HPI above. I have reviewed all other systems and they were negative.   Physical Exam  General:  alert, well-developed,  well-nourished, and cooperative to examination.    Eyes:  vision grossly intact; pupils equal, round and reactive to light.  conjunctiva and lids normal.    Ears:  normal pinnae bilaterally, without erythema, swelling, or tenderness to palpation. TMs clear, without effusion, or cerumen impaction. Hearing grossly normal bilaterally  Mouth:  teeth and gums in good repair; mucous membranes moist, without lesions or ulcers. oropharynx clear without exudate, no erythema.  Neck:  +myofascial tightness with palpation paracervical region - FROM Lungs:  normal respiratory effort, no intercostal retractions or use of accessory muscles; normal breath sounds bilaterally - no crackles and no wheezes.    Heart:  normal rate, regular rhythm,  no murmur, and no rub. BLE without edema.  Abdomen:  soft, non-tender, normal bowel sounds, no distention; no masses and no appreciable hepatomegaly or splenomegaly.   Msk:  back: full range of motion of lumbar spine. Nontender to palpation. Negative straight leg raise. Deep tendon reflexes symmetrically intact at Achilles and patella, negative clonus. Sensation intact throughout all dermatomes in bilateral lower extremities. Full strength to manual muscle testing in all major muscule groups including EHL, anterior tibialis, gastrocnemius, quadriceps, and iliopsoas. Able to heel and toe walk without difficulty and ambulates with a normal gait.  Neurologic:  alert & oriented X3 and cranial nerves II-XII symetrically intact.  strength normal in all extremities, sensation intact to light touch, and gait normal. speech fluent without dysarthria or aphasia; follows commands with good comprehension.  Skin:  no rashes, vesicles, ulcers, or erythema. No nodules or irregularity to palpation.  Psych:  Oriented X3, memory intact for recent and remote, normally interactive, good eye contact, not anxious appearing, not depressed appearing, and not agitated.      Impression &  Recommendations:  Problem # 1:  HYPOTHYROIDISM (ICD-244.9)  s/p total thyroidectomy 09/2008 for cancer -  follow with endo ([facility]) for same - recent labs normal - no change rec Her updated medication list for this problem includes:    Synthroid 88 Mcg Tabs (Levothyroxine sodium) .Marland Kitchen... Take 1 by mouth once daily  Labs Reviewed: TSH: 3.310 (11/07/2009)    HgBA1c: 5.7 (11/07/2009) Chol: 178 (11/30/2009)   HDL: 51 (11/30/2009)   LDL: 113 (11/30/2009)   TG: 69 (11/30/2009)  Problem # 2:  CERVICALGIA (ICD-723.1) related to osteophytes and DDD per pt report -  has seen general ortho for same but would like another opinion - refer now prior xray and films done, not repeated today due to no change in nature of symptoms  Her updated medication list for this problem includes:    Tramadol-acetaminophen 37.5-325 Mg Tabs (Tramadol-acetaminophen) .Marland Kitchen... Take 1 by mouth once daily as needed for back pain    Indomethacin 25 Mg Caps (Indomethacin) .Marland Kitchen... Take 2 by mouth once daily as needed    Aspirin 325 Mg Tabs (Aspirin) .Marland Kitchen... Take 1/2 by mouth once daily  Orders: Neurosurgeon Referral (Neurosurgeon)  Problem # 3:  GERD (ICD-530.81) EGD 12/06/2009 with gastritis - cont two times a day PPI as ongoing Her updated medication list for this problem includes:    Pantoprazole Sodium 40 Mg Tbec (Pantoprazole sodium) .Marland Kitchen... Take 1 two times a day  Labs Reviewed: Hgb: 12.9 (11/08/2009)   Hct: 39.5 (11/08/2009)  Problem # 4:  ARTHRITIS (ICD-716.90) neck and knees - cont pain meds and to see ortho as needed   Problem # 5:  HYPERTENSION (ICD-401.9)  BP today: 132/72  Labs Reviewed: K+: 4.1 (11/08/2009) Creat: : 0.97 (11/08/2009)   Chol: 178 (11/30/2009)   HDL: 51 (11/30/2009)   LDL: 113 (11/30/2009)   TG: 69 (11/30/2009)  Problem # 6:  DYSLIPIDEMIA (ICD-272.4)  Her updated medication list for this problem includes:    Pravachol 40 Mg Tabs (Pravastatin sodium) .Marland Kitchen... Take 2 by mouth qd  Labs  Reviewed: SGOT: 16 (11/08/2009)   SGPT: 15 (11/08/2009)   HDL:51 (11/30/2009), 48 (11/07/2009)  LDL:113 (11/30/2009), 114 (11/07/2009)  Chol:178 (11/30/2009), 183 (11/07/2009)  Trig:69 (11/30/2009), 105 (11/07/2009)  Complete Medication List: 1)  Pantoprazole Sodium 40 Mg Tbec (Pantoprazole sodium) .... Take 1 two times a day 2)  Plavix 75 Mg Tabs (Clopidogrel bisulfate) .... Take 1 by mouth qd 3)  Synthroid 88 Mcg Tabs (Levothyroxine sodium) .... Take 1 by mouth once daily 4)  Tramadol-acetaminophen 37.5-325 Mg Tabs (Tramadol-acetaminophen) .... Take 1 by mouth once daily as needed for back pain 5)  Indomethacin 25 Mg Caps (Indomethacin) .... Take 2 by mouth once daily as needed 6)  Aspirin 325 Mg Tabs (Aspirin) .... Take 1/2 by mouth once daily 7)  Pravachol 40 Mg Tabs (Pravastatin sodium) .... Take 2 by mouth qd 8)  Zolpidem Tartrate 10 Mg Tabs (Zolpidem tartrate) .... Take 1/2 at bedtime  Patient Instructions: 1)  it was good to see you today.  2)  we'll make referral to  neurosurgery for your neck pain. Our office will contact you regarding this appointment once made.  3)  no medication changes - labs, recent tests, and hospitalization reviewed today - 4)  Please schedule a follow-up appointment in 4-6 months, sooner if problems.    Immunization History:  Tetanus/Td Immunization History:    Tetanus/Td:  historical (08/27/2008)  Influenza Immunization History:    Influenza:  historical (05/27/2009)  Pneumovax Immunization History:    Pneumovax:  historical (05/27/2009)

## 2010-09-28 NOTE — Progress Notes (Signed)
Summary: indomethacin  Phone Note From Pharmacy   Caller: [facility] Details for Reason: Clarifications Summary of Call: Recieved fax stating the clarifications on med Indomethacin. MD sign form change to indomethacin take 1 two times a day # 180. Faxed back to [facility]. also updated EMR Initial call taken by: Orlan Leavens RMA,  March 24, 2010 9:47 AM    New/Updated Medications: INDOMETHACIN 25 MG CAPS (INDOMETHACIN) take 2 by mouth once daily Prescriptions: INDOMETHACIN 25 MG CAPS (INDOMETHACIN) take 2 by mouth once daily  #180 x 1   Entered by:   Orlan Leavens RMA   Authorized by:   Newt Lukes MD   Signed by:   Orlan Leavens RMA on 03/24/2010   Method used:   Historical   RxID:   [id number]

## 2010-09-28 NOTE — Progress Notes (Signed)
Summary: zolipdem  Phone Note From Pharmacy   Caller: [facility] Summary of Call: Recieved script for zolipidem 10mg  take 1/2 at bedtime . Does not match 90 days. Pls verify the quanity/ directions. MD sign form pls change zolpidem 10mg  take 1/2-1 by mouth at bedtime for sleep # 90. Faxed form back to [facility]. Updated EMR Initial call taken by: Orlan Leavens RMA,  May 15, 2010 4:59 PM    New/Updated Medications: ZOLPIDEM TARTRATE 10 MG TABS (ZOLPIDEM TARTRATE) take 1/2-1 by mouth at bedtime for sleep Prescriptions: ZOLPIDEM TARTRATE 10 MG TABS (ZOLPIDEM TARTRATE) take 1/2-1 by mouth at bedtime for sleep  #90 x 0   Entered by:   Orlan Leavens RMA   Authorized by:   Newt Lukes MD   Signed by:   Orlan Leavens RMA on 05/15/2010   Method used:   Historical   RxID:   [id number]

## 2010-09-28 NOTE — Progress Notes (Signed)
Summary: alprazolam  Phone Note From Pharmacy   Caller: [facility] Summary of Call: Md recieved fax stating need to verify directions for alprozolam 0.5mg  rx that was recieved indicates to take 1/2 at bedtime does not meedt 90 day criteria. Md sign form change to 1 by mouth at bedtime as needed # 90 with no refills. Fax back to [facility]. updated emr Initial call taken by: Orlan Leavens RMA,  May 18, 2010 1:27 PM    New/Updated Medications: ALPRAZOLAM 0.5 MG TABS (ALPRAZOLAM) take 1 by mouth as needed Prescriptions: ALPRAZOLAM 0.5 MG TABS (ALPRAZOLAM) take 1 by mouth as needed  #90 x 0   Entered by:   Orlan Leavens RMA   Authorized by:   Newt Lukes MD   Signed by:   Orlan Leavens RMA on 05/18/2010   Method used:   Historical   RxID:   [id number]

## 2010-09-28 NOTE — Medication Information (Signed)
Summary: Clarification/[facility]  Clarification/[facility]   Imported By: Lester Inez 05/18/2010 10:12:34  _____________________________________________________________________  External Attachment:    Type:   Image     Comment:   External Document

## 2010-09-28 NOTE — Letter (Signed)
Summary: [facility]  [facility]   Imported By: Sherian Rein 08/25/2010 11:43:44  _____________________________________________________________________  External Attachment:    Type:   Image     Comment:   External Document

## 2010-09-28 NOTE — Progress Notes (Signed)
Summary: Dental office?  Phone Note Call from Patient   Caller: Dr Darnelle Maffucci -[phone] Call For: Newt Lukes MD Summary of Call: Pt on Plavix, blood thinner - she needs tooth extractions. Dr Dan Humphreys wants to know how long she needs to be off Plavix. Initial call taken by: Verdell Face,  August 09, 2010 4:23 PM  Follow-up for Phone Call        stop plavix 5 day before extraction, then resume day after extraction - or as soon as allowed after procedure when ok with dentist Follow-up by: Newt Lukes MD,  August 10, 2010 9:01 AM  Additional Follow-up for Phone Call Additional follow up Details #1::        Sammy at Dental office advised of above Additional Follow-up by: Margaret Pyle, CMA,  August 10, 2010 12:46 PM

## 2010-09-28 NOTE — Letter (Signed)
Summary: Carman Ching MD/[facility] Lacie Draft MD/[facility]   Imported By: Lester Hawthorne 09/19/2010 11:28:18  _____________________________________________________________________  External Attachment:    Type:   Image     Comment:   External Document

## 2010-09-28 NOTE — Assessment & Plan Note (Signed)
Summary: 4-6 MTH FU  STC   Vital Signs:  Patient profile:   77 year old female Height:      63 inches (160.02 cm) Weight:      162.0 pounds (73.64 kg) O2 Sat:      94 % on Room air Temp:     98.6 degrees F (37.00 degrees C) oral Pulse rate:   63 / minute BP sitting:   132 / 78  (left arm) Cuff size:   regular  Vitals Entered By: Orlan Leavens RMA (May 31, 2010 2:50 PM)  O2 Flow:  Room air CC: 3 month follow-up Is Patient Diabetic? No Pain Assessment Patient in pain? no      Comments flu shot   Primary Care Provider:  Newt Lukes MD  CC:  3 month follow-up.  History of Present Illness: here for followup -  1) dyslipidemia - reports compliance with ongoing medical treatment and no changes in medication dose or frequency. denies adverse side effects related to current therapy. no GI or muscle c/o  2) c/o ongoing chronic neck pain - sees ortho and nsurg for same - told the pain is due to arthritis and bone spurs no radiation into arms but feels "tight" - pain causes difficulty with ADLs and sleep position in bed no balance problems or falls pain partially relieved by Indocin buit "never goes away and always comes back" prev tried Voltaren gel - use in 3 different sites at same time- felt "sick" so stopped tx  3) hx thyroid ca s/p resection 09/2008 for same - subsquent post surgical hypothyroidism -  continue to follow with endo and surg for same - no recent dose changes in thyroid meds no hoarsness or dysphagia, no weight loss - planning for nuc study in 04/2010 per surg ov note 02/02/10  4) GERD with gastritis - no changes in meds - no current reflux symptoms or abd pain.  prior GI eval revealed gastrtitis (EGD 12/05/09)  CP eval (24h hosp 10/2009) - no recurrent CP, on twice daily PPI - cont nausea and constipation  Clinical Review Panels:  Immunizations   Last Tetanus Booster:  Historical (08/27/2008)   Last Flu Vaccine:  Fluvax 3+ (05/31/2010)   Last  Pneumovax:  Historical (05/27/2009)  Lipid Management   Cholesterol:  178 (11/30/2009)   LDL (bad choesterol):  113 (11/30/2009)   HDL (good cholesterol):  51 (11/30/2009)   Triglycerides:  69 (11/30/2009)  CBC   WBC:  5.0 (03/17/2010)   RBC:  4.48 (03/17/2010)   Hgb:  14.2 (03/17/2010)   Hct:  40.9 (03/17/2010)   Platelets:  184.0 (03/17/2010)   MCV  91.3 (03/17/2010)   MCHC  34.8 (03/17/2010)   RDW  13.4 (03/17/2010)   PMN:  64.0 (03/17/2010)   Lymphs:  24.5 (03/17/2010)   Monos:  7.6 (03/17/2010)   Eosinophils:  3.1 (03/17/2010)   Basophil:  0.8 (03/17/2010)  Complete Metabolic Panel   Glucose:  97 (11/08/2009)   Sodium:  144 (11/08/2009)   Potassium:  4.1 (11/08/2009)   Chloride:  111 (11/08/2009)   CO2:  28 (11/08/2009)   BUN:  11 (11/08/2009)   Creatinine:  0.97 (11/08/2009)   Albumin:  3.2 (11/08/2009)   Total Protein:  5.4 (11/08/2009)   Calcium:  8.9 (11/08/2009)   Total Bili:  0.8 (11/08/2009)   Alk Phos:  62 (11/08/2009)   SGPT (ALT):  15 (11/08/2009)   SGOT (AST):  16 (11/08/2009)   Current Medications (verified): 1)  Pantoprazole Sodium 40 Mg Tbec (Pantoprazole Sodium) .... Take 1 Two Times A Day 2)  Plavix 75 Mg Tabs (Clopidogrel Bisulfate) .... Take 1 By Mouth Qd 3)  Synthroid 88 Mcg Tabs (Levothyroxine Sodium) .... Take 1 By Mouth Once Daily 4)  Tramadol-Acetaminophen 37.5-325 Mg Tabs (Tramadol-Acetaminophen) .... Take 1 By Mouth Once Daily As Needed For Back Pain 5)  Indomethacin 25 Mg Caps (Indomethacin) .... Take 2 By Mouth Once Daily 6)  Aspirin 325 Mg Tabs (Aspirin) .... Take 1/2 By Mouth Once Daily 7)  Pravachol 40 Mg Tabs (Pravastatin Sodium) .... Take 2 By Mouth Qd 8)  Alprazolam 0.5 Mg Tabs (Alprazolam) .... Take 1 By Mouth As Needed 9)  Methocarbamol 500 Mg Tabs (Methocarbamol) .... Take 1-2 By Mouth Once Daily As Needed  Allergies (verified): 1)  ! Iodine  Past History:  Past Medical History: Hypertension Depression  GERD with  Peptic ulcer disease hx; gastritis on EGD 12/06/09 Cerebrovascular accident, hx of thyroid cancer (papillary) s/p total thyroidectomy 09/2008 hypothyroid, postsurgical arthritis -DDD cervical spine, knees  MD roster: cards - [facility] - edwards  surg - [facility] - gioffre endo - balan nsurg - elsner  Review of Systems  The patient denies weight loss, chest pain, syncope, headaches, and abdominal pain.    Physical Exam  General:  alert, well-developed, well-nourished, and cooperative to examination.    Lungs:  normal respiratory effort, no intercostal retractions or use of accessory muscles; normal breath sounds bilaterally - no crackles and no wheezes.    Heart:  normal rate, regular rhythm, no murmur, and no rub. BLE without edema.  Abdomen:  soft, non-tender, normal bowel sounds, no distention; no masses and no appreciable hepatomegaly or splenomegaly.     Impression & Recommendations:  Problem # 1:  GERD (ICD-530.81)  GI note from 04/2010 reviewed EGD 12/06/2009 with gastritis - cont two times a day PPI as ongoing ok to use as needed promethazine and miralax as per GI rec - erx for same provided today Her updated medication list for this problem includes:    Pantoprazole Sodium 40 Mg Tbec (Pantoprazole sodium) .Marland Kitchen... Take 1 two times a day  Labs Reviewed: Hgb: 12.9 (11/08/2009)   Hct: 39.5 (11/08/2009)  Orders: Prescription Created Electronically [phone])  Problem # 2:  DYSLIPIDEMIA (ICD-272.4)  Her updated medication list for this problem includes:    Pravachol 40 Mg Tabs (Pravastatin sodium) .Marland Kitchen... Take 2 by mouth qd  Labs Reviewed: SGOT: 16 (11/08/2009)   SGPT: 15 (11/08/2009)   HDL:51 (11/30/2009), 48 (11/07/2009)  LDL:113 (11/30/2009), 114 (11/07/2009)  Chol:178 (11/30/2009), 183 (11/07/2009)  Trig:69 (11/30/2009), 105 (11/07/2009)  Problem # 3:  HYPERTENSION (ICD-401.9)  diet controlled - no meds at this time  BP today: 132/78 Prior BP: 132/82  (03/17/2010)  Labs Reviewed: K+: 4.1 (11/08/2009) Creat: : 0.97 (11/08/2009)   Chol: 178 (11/30/2009)   HDL: 51 (11/30/2009)   LDL: 113 (11/30/2009)   TG: 69 (11/30/2009)  Problem # 4:  HYPOTHYROIDISM (ICD-244.9)  Her updated medication list for this problem includes:    Synthroid 88 Mcg Tabs (Levothyroxine sodium) .Marland Kitchen... Take 1 by mouth once daily  s/p total thyroidectomy 09/2008 for cancer -  follow with endo (balan) for same -  Labs Reviewed: TSH: 1.26 (03/17/2010)    HgBA1c: 5.7 (11/07/2009) Chol: 178 (11/30/2009)   HDL: 51 (11/30/2009)   LDL: 113 (11/30/2009)   TG: 69 (11/30/2009)  Complete Medication List: 1)  Pantoprazole Sodium 40 Mg Tbec (Pantoprazole sodium) .... Take 1  two times a day 2)  Plavix 75 Mg Tabs (Clopidogrel bisulfate) .... Take 1 by mouth qd 3)  Synthroid 88 Mcg Tabs (Levothyroxine sodium) .... Take 1 by mouth once daily 4)  Tramadol-acetaminophen 37.5-325 Mg Tabs (Tramadol-acetaminophen) .... Take 1 by mouth once daily as needed for back pain 5)  Indomethacin 25 Mg Caps (Indomethacin) .... Take 2 by mouth once daily 6)  Aspirin 325 Mg Tabs (Aspirin) .... Take 1/2 by mouth once daily 7)  Pravachol 40 Mg Tabs (Pravastatin sodium) .... Take 2 by mouth qd 8)  Alprazolam 0.5 Mg Tabs (Alprazolam) .... Take 1 by mouth as needed 9)  Methocarbamol 500 Mg Tabs (Methocarbamol) .... Take 1-2 by mouth once daily as needed 10)  Promethazine Hcl 12.5 Mg Tabs (Promethazine hcl) .Marland Kitchen.. 1 by mouth every 8 hours as needed for nausea  Other Orders: Flu Vaccine 50yrs + MEDICARE PATIENTS (Y7829) Administration Flu vaccine - MCR (F6213) Flu Vaccine Consent Questions     Do you have a history of severe allergic reactions to this vaccine? no    Any prior history of allergic reactions to egg and/or gelatin? no    Do you have a sensitivity to the preservative Thimersol? no    Do you have a past history of Guillan-Barre Syndrome? no    Do you currently have an acute febrile illness?  no    Have you ever had a severe reaction to latex? no    Vaccine information given and explained to patient? yes    Are you currently pregnant? no    Lot Number:[id number]   Exp Date:02/24/2011   Site Given  Left Deltoid IMion Flu vaccine - MCR ([id number])  Patient Instructions: 1)  it was good to see you  2)  use the promethazine for sick stomach as needed - your prescription has been electronically submitted to your pharmacy. Please take as directed. Contact our office if you believe you're having problems with the medication(s).  3)  increase miralax to two times a day with prune juice for constipation as discussed - 4)  Please schedule a follow-up appointment in 6 months, sooner if problems. will pan to check cholesterol next visit (unless done by different doctor prior to that time) 5)  HAPPY BIRTHDAY! Prescriptions: PROMETHAZINE HCL 12.5 MG TABS (PROMETHAZINE HCL) 1 by mouth every 8 hours as needed for nausea  #30 x 1   Entered and Authorized by:   Newt Lukes MD   Signed by:   Newt Lukes MD on 05/31/2010   Method used:   Electronically to        [facility] [phone]* (retail)       [address]       [place], [place]  [address]       Ph: [phone] or [phone]       Fax: [phone]   RxID:   [id number]  .[email or web address]  Appended Document: 4-6 MTH FU  STC    Clinical Lists Changes  Medications: Rx of PRAVACHOL 40 MG TABS (PRAVASTATIN SODIUM) take 2 by mouth qd;  #60 x 5;  Signed;  Entered by: Orlan Leavens RMA;  Authorized by: Newt Lukes MD;  Method used: [facility] [phone]*, [address], [place], [place]  [address], Ph: [phone] or [phone], Fax: [phone]    Prescriptions: PRAVACHOL 40 MG TABS (PRAVASTATIN SODIUM) take 2 by mouth qd  #60 x 5   Entered by:   Orlan Leavens RMA  Authorized by:   Newt Lukes MD   Signed by:   Orlan Leavens RMA on 05/31/2010   Method used:   Electronically to        [facility] [phone]*  (retail)       [address]       [place], [place]  [address]       Ph: [phone] or [phone]       Fax: [phone]   RxID:   [id number]

## 2010-09-28 NOTE — Progress Notes (Signed)
Summary: Rx refill req  Phone Note Refill Request Message from:  Patient on July 26, 2010 1:35 PM  Refills Requested: Medication #1:  PLAVIX 75 MG TABS take 1 by mouth qd   Dosage confirmed as above?Dosage Confirmed   [facility]ly Requested: [facility] month [facility] [address]   Method Requested: Electronic Initial call taken by: Margaret Pyle, CMA,  July 26, 2010 1:35 PM    Prescriptions: PLAVIX 75 MG TABS (CLOPIDOGREL BISULFATE) take 1 by mouth qd  #30 x 1   Entered by:   Margaret Pyle, CMA   Authorized by:   Newt Lukes MD   Signed by:   Margaret Pyle, CMA on 07/26/2010   Method used:   Electronically to        [facility] [phone]* (retail)       [address]       [place], [place]  [address]       Ph: [phone] or [phone]       Fax: [phone]   RxID:   [id number]

## 2010-09-28 NOTE — Letter (Signed)
Summary: [facility]   Imported By: Lennie Odor 05/19/2010 12:22:38  _____________________________________________________________________  External Attachment:    Type:   Image     Comment:   External Document

## 2010-09-29 NOTE — Letter (Signed)
Summary: [facility]   Imported By: Lester [place] Wilson 02/13/2010 08:21:56  _____________________________________________________________________  External Attachment:    Type:   Image     Comment:   External Document

## 2010-09-29 NOTE — Medication Information (Signed)
Summary: Clarification/PrescriptionSolutions  Clarification/PrescriptionSolutions   Imported By: Sherian Rein 05/22/2010 08:22:12  _____________________________________________________________________  External Attachment:    Type:   Image     Comment:   External Document

## 2010-10-27 ENCOUNTER — Ambulatory Visit (INDEPENDENT_AMBULATORY_CARE_PROVIDER_SITE_OTHER): Payer: Medicare Other | Admitting: Internal Medicine

## 2010-10-27 ENCOUNTER — Encounter: Payer: Self-pay | Admitting: Internal Medicine

## 2010-10-27 DIAGNOSIS — F329 Major depressive disorder, single episode, unspecified: Secondary | ICD-10-CM

## 2010-11-02 NOTE — Assessment & Plan Note (Signed)
Summary: 6 WK ROV//NWS #   Vital Signs:  Patient profile:   77 year old female Weight:      157.6 pounds (71.64 kg) BMI:     28.02 O2 Sat:      97 % on Room air Temp:     98.0 degrees F (36.67 degrees C) oral Pulse rate:   73 / minute BP sitting:   130 / 62  (left arm) Cuff size:   regular  Vitals Entered By: Orlan Leavens RMA (October 27, 2010 1:16 PM)  O2 Flow:  Room air CC: 6 week follow-up Is Patient Diabetic? No Pain Assessment Patient in pain? no        Primary Care Provider:  Newt Lukes MD  CC:  6 week follow-up.  History of Present Illness: here for depression f/u - 6 weeks ago c/o tearfulness and "anxiety"/nervousness  head in "fog" and feels forgetful -  a/w excess physical fatigue and irregular sleep - +hx same, started wellbutrin trial 08/2010 at dtr urging - f caused nuasea so stopped med after 4 days -  persisting symptoms and willing to try new med  reviewe chronic med issues 1) dyslipidemia - reports compliance with ongoing medical treatment and no changes in medication dose or frequency. denies adverse side effects related to current therapy. no GI or muscle c/o  2) c/o ongoing chronic neck pain - sees ortho and nsurg for same - told the pain is due to arthritis and bone spurs no radiation into arms but feels "tight" - pain causes difficulty with ADLs and sleep position in bed no balance problems or falls pain partially relieved by Indocin buit "never goes away and always comes back" now uses tramadol combo as needed - ?planning steroid shot with nsurg  3) hx thyroid ca s/p resection 09/2008 for same - subsquent post surgical hypothyroidism -  follows with endo and surg for same - no recent dose changes in thyroid meds no hoarsness or dysphagia, no weight loss - planning for nuc study in 04/2010 per surg ov note 02/02/10  4) GERD with gastritis - no changes in meds - no current reflux symptoms or abd pain.  prior GI eval revealed gastrtitis (EGD  12/05/09)  CP eval (24h hosp 10/2009) - no recurrent CP, on twice daily PPI - cont nausea and constipation  Current Medications (verified): 1)  Pantoprazole Sodium 40 Mg Tbec (Pantoprazole Sodium) .... Take 1 Two Times A Day 2)  Plavix 75 Mg Tabs (Clopidogrel Bisulfate) .... Take 1 By Mouth Qd 3)  Synthroid 88 Mcg Tabs (Levothyroxine Sodium) .... Take 1 By Mouth Once Daily 4)  Indomethacin 25 Mg Caps (Indomethacin) .... Take 2 By Mouth Once Daily 5)  Aspirin 325 Mg Tabs (Aspirin) .... Take 1/2 By Mouth Once Daily 6)  Pravachol 40 Mg Tabs (Pravastatin Sodium) .... Take 2 By Mouth Qd 7)  Alprazolam 0.5 Mg Tabs (Alprazolam) .... Take 1 By Mouth As Needed 8)  Methocarbamol 500 Mg Tabs (Methocarbamol) .... Take 1-2 By Mouth Once Daily As Needed 9)  Promethazine Hcl 12.5 Mg Tabs (Promethazine Hcl) .Marland Kitchen.. 1 By Mouth Every 8 Hours As Needed For Nausea 10)  Tramadol-Acetaminophen 37.5-325 Mg Tabs (Tramadol-Acetaminophen) .Marland Kitchen.. 1 By Mouth Twice Daily As Needed For Pain 11)  Budeprion Xl 150 Mg Xr24h-Tab (Bupropion Hcl) .Marland Kitchen.. 1 By Mouth Once Daily For Depression Symptoms  Allergies (verified): 1)  ! Iodine  Past History:  Past Medical History: Hypertension Depression    GERD with Peptic  ulcer disease hx; gastritis on EGD 12/06/09 Cerebrovascular accident, hx of thyroid cancer (papillary) s/p total thyroidectomy 09/2008 hypothyroid, postsurgical arthritis -DDD cervical spine, knees  MD roster: cards - [facility] - edwards  surg - [facility] - gioffre endo - balan nsurg - elsner  Review of Systems  The patient denies fever, syncope, headaches, and abdominal pain.    Physical Exam  General:  alert, well-developed, well-nourished, and cooperative to examination.   dtr at side Lungs:  normal respiratory effort, no intercostal retractions or use of accessory muscles; normal breath sounds bilaterally - no crackles and no wheezes.    Heart:  normal rate, regular rhythm, no murmur, and no rub. BLE  without edema.  Psych:  Oriented X3, memory intact for recent and remote, normally interactive, good eye contact, mod anxious appearing, mild depressed appearing, less tearful   Impression & Recommendations:  Problem # 1:  DEPRESSION (ICD-311)  Her updated medication list for this problem includes:    Amitriptyline Hcl 10 Mg Tabs (Amitriptyline hcl) .Marland Kitchen... 1 by mouth at bedtime  situational, exac by fustration about pain change wellbutrin to amitrip and ok to cont to use low dose bz if needed  f/u 6 weeks to review, call sooner if probs - pt/dtr understand same and agree  Orders: Prescription Created Electronically [phone]  Complete Medication List: 1)  Pantoprazole Sodium 40 Mg Tbec (Pantoprazole sodium) .... Take 1 two times a day 2)  Plavix 75 Mg Tabs (Clopidogrel bisulfate) .... Take 1 by mouth qd 3)  Synthroid 88 Mcg Tabs (Levothyroxine sodium) .... Take 1 by mouth once daily 4)  Indomethacin 25 Mg Caps (Indomethacin) .... Take 2 by mouth once daily 5)  Aspirin 81 Mg Tbec (Aspirin) .Marland Kitchen.. 1 by mouth once daily 6)  Pravachol 40 Mg Tabs (Pravastatin sodium) .... Take 2 by mouth qd 7)  Zolpidem Tartrate 5 Mg Tabs (Zolpidem tartrate) .Marland Kitchen.. 1 by mouth at bedtime as needed for sleep 8)  Methocarbamol 500 Mg Tabs (Methocarbamol) .... Take 1-2 by mouth once daily as needed 9)  Ondansetron Hcl 4 Mg Tabs (Ondansetron hcl) .Marland Kitchen.. 1 by mouth every 8 hours as needed for nausea 10)  Tramadol-acetaminophen 37.5-325 Mg Tabs (Tramadol-acetaminophen) .Marland Kitchen.. 1 by mouth twice daily as needed for pain 11)  Amitriptyline Hcl 10 Mg Tabs (Amitriptyline hcl) .Marland Kitchen.. 1 by mouth at bedtime  Patient Instructions: 1)  it was good to see you today. 2)  stop wellbutrin and start amitriptyline for depression symptoms - take one pill everyday at bedtime 3)  refills on other medications as discussed-  4)  your prescriptions have been electronically submitted to your pharmacy. Please take as directed. Contact our office  if you believe you're having problems with the medication(s).  5)  Please schedule a follow-up appointment in 6 weeks to review depression, call sooner if problems.  Prescriptions: ONDANSETRON HCL 4 MG TABS (ONDANSETRON HCL) 1 by mouth every 8 hours as needed for nausea  #30 x 0   Entered and Authorized by:   Newt Lukes MD   Signed by:   Newt Lukes MD on 10/27/2010   Method used:   Electronically to        [facility] [phone]* (retail)       [address]       [place], [place]  [address]       Ph: [phone] or [phone]       Fax: [phone]   RxID:   [id number] AMITRIPTYLINE  HCL 10 MG TABS (AMITRIPTYLINE HCL) 1 by mouth at bedtime  #30 x 3   Entered and Authorized by:   Newt Lukes MD   Signed by:   Newt Lukes MD on 10/27/2010   Method used:   Electronically to        [facility] [phone]* (retail)       [address]       [place], [place]  [address]       Ph: [phone] or [phone]       Fax: [phone]   RxID:   [id number] ZOLPIDEM TARTRATE 5 MG TABS (ZOLPIDEM TARTRATE) 1 by mouth at bedtime as needed for sleep  #30 x 3   Entered and Authorized by:   Newt Lukes MD   Signed by:   Newt Lukes MD on 10/27/2010   Method used:   Printed then faxed to ...       [facility] [phone]* (retail)       [address]       [place], [place]  [address]       Ph: [phone] or [phone]       Fax: [phone]   RxID:   [id number]    Orders Added: 1)  Est. Patient Level IV [15176] 2)  Prescription Created Electronically [phone]

## 2010-11-07 ENCOUNTER — Telehealth: Payer: Self-pay | Admitting: Internal Medicine

## 2010-11-14 NOTE — Progress Notes (Signed)
Summary: Rx refill req  Phone Note Refill Request Message from:  Patient on November 07, 2010 1:15 PM  Refills Requested: Medication #1:  METHOCARBAMOL 500 MG TABS take 1-2 by mouth once daily as needed   Dosage confirmed as above?Dosage Confirmed   Supply Requested: 3 months  Method Requested: Fax to Local Pharmacy Initial call taken by: Margaret Pyle, CMA,  November 07, 2010 1:15 PM  Follow-up for Phone Call        ok to fill Follow-up by: Newt Lukes MD,  November 07, 2010 3:40 PM    Prescriptions: METHOCARBAMOL 500 MG TABS (METHOCARBAMOL) take 1-2 by mouth once daily as needed  #60 x 2   Entered by:   Margaret Pyle, CMA   Authorized by:   Newt Lukes MD   Signed by:   Margaret Pyle, CMA on 11/07/2010   Method used:   Electronically to        [facility] [phone]* (retail)       [address]       [place], [place]  [address]       Ph: [phone] or [phone]       Fax: [phone]   RxID:   [id number]

## 2010-11-15 ENCOUNTER — Encounter: Payer: Self-pay | Admitting: *Deleted

## 2010-11-19 LAB — CBC
Hemoglobin: 12.9 g/dL (ref 12.0–15.0)
Hemoglobin: 14.1 g/dL (ref 12.0–15.0)
MCHC: 32.5 g/dL (ref 30.0–36.0)
MCHC: 32.6 g/dL (ref 30.0–36.0)
Platelets: 156 10*3/uL (ref 150–400)
Platelets: ADEQUATE 10*3/uL (ref 150–400)
RDW: 14.1 % (ref 11.5–15.5)
WBC: 7.7 10*3/uL (ref 4.0–10.5)

## 2010-11-19 LAB — COMPREHENSIVE METABOLIC PANEL
ALT: 15 U/L (ref 0–35)
Albumin: 3.2 g/dL — ABNORMAL LOW (ref 3.5–5.2)
Alkaline Phosphatase: 62 U/L (ref 39–117)
BUN: 11 mg/dL (ref 6–23)
Calcium: 8.9 mg/dL (ref 8.4–10.5)
Glucose, Bld: 97 mg/dL (ref 70–99)
Potassium: 4.1 mEq/L (ref 3.5–5.1)
Sodium: 144 mEq/L (ref 135–145)
Total Protein: 5.4 g/dL — ABNORMAL LOW (ref 6.0–8.3)

## 2010-11-19 LAB — BASIC METABOLIC PANEL
BUN: 17 mg/dL (ref 6–23)
CO2: 26 mEq/L (ref 19–32)
Chloride: 104 mEq/L (ref 96–112)
Creatinine, Ser: 1.24 mg/dL — ABNORMAL HIGH (ref 0.4–1.2)

## 2010-11-19 LAB — RAPID URINE DRUG SCREEN, HOSP PERFORMED
Amphetamines: NOT DETECTED
Opiates: NOT DETECTED
Tetrahydrocannabinol: NOT DETECTED

## 2010-11-19 LAB — DIFFERENTIAL
Basophils Absolute: 0.1 10*3/uL (ref 0.0–0.1)
Eosinophils Absolute: 0.1 10*3/uL (ref 0.0–0.7)
Eosinophils Relative: 2 % (ref 0–5)
Lymphs Abs: 1.5 10*3/uL (ref 0.7–4.0)
Lymphs Abs: 1.8 10*3/uL (ref 0.7–4.0)
Monocytes Absolute: 0.3 10*3/uL (ref 0.1–1.0)
Monocytes Absolute: 0.5 10*3/uL (ref 0.1–1.0)
Monocytes Relative: 6 % (ref 3–12)
Neutro Abs: 3.3 10*3/uL (ref 1.7–7.7)
Neutrophils Relative %: 59 % (ref 43–77)
Neutrophils Relative %: 72 % (ref 43–77)

## 2010-11-19 LAB — URINALYSIS, ROUTINE W REFLEX MICROSCOPIC
Nitrite: NEGATIVE
Specific Gravity, Urine: 1.01 (ref 1.005–1.030)
Urobilinogen, UA: 1 mg/dL (ref 0.0–1.0)
pH: 7 (ref 5.0–8.0)

## 2010-11-19 LAB — URINE MICROSCOPIC-ADD ON

## 2010-11-19 LAB — CK TOTAL AND CKMB (NOT AT ARMC)
Relative Index: INVALID (ref 0.0–2.5)
Total CK: 70 U/L (ref 7–177)

## 2010-11-19 LAB — LIPID PANEL
Cholesterol: 183 mg/dL (ref 0–200)
HDL: 48 mg/dL (ref >39)
LDL Cholesterol: 114 mg/dL — ABNORMAL HIGH (ref 0–99)
Total CHOL/HDL Ratio: 3.8 RATIO
Triglycerides: 105 mg/dL (ref <150)

## 2010-11-19 LAB — POCT CARDIAC MARKERS
Myoglobin, poc: 67.3 ng/mL (ref 12–200)
Troponin i, poc: <0.05 ng/mL (ref 0.00–0.09)
Troponin i, poc: <0.05 ng/mL (ref 0.00–0.09)

## 2010-11-19 LAB — HEMOGLOBIN A1C: Hgb A1c MFr Bld: 5.7 % (ref 4.6–6.1)

## 2010-11-19 LAB — PHOSPHORUS: Phosphorus: 3.2 mg/dL (ref 2.3–4.6)

## 2010-11-19 LAB — TSH: TSH: 3.31 u[IU]/mL (ref 0.350–4.500)

## 2010-11-23 ENCOUNTER — Other Ambulatory Visit: Payer: Self-pay | Admitting: Internal Medicine

## 2010-11-25 ENCOUNTER — Encounter: Payer: Self-pay | Admitting: Internal Medicine

## 2010-11-27 LAB — DIFFERENTIAL
Eosinophils Relative: 3 % (ref 0–5)
Lymphocytes Relative: 29 % (ref 12–46)
Lymphs Abs: 1.5 10*3/uL (ref 0.7–4.0)
Monocytes Absolute: 0.3 10*3/uL (ref 0.1–1.0)
Monocytes Relative: 7 % (ref 3–12)

## 2010-11-27 LAB — BASIC METABOLIC PANEL
Chloride: 107 mEq/L (ref 96–112)
GFR calc Af Amer: 58 mL/min — ABNORMAL LOW (ref >60)
GFR calc non Af Amer: 48 mL/min — ABNORMAL LOW (ref >60)
Potassium: 3.9 mEq/L (ref 3.5–5.1)
Sodium: 139 mEq/L (ref 135–145)

## 2010-11-27 LAB — URINALYSIS, ROUTINE W REFLEX MICROSCOPIC
Glucose, UA: NEGATIVE mg/dL
Nitrite: NEGATIVE
Specific Gravity, Urine: 1.008 (ref 1.005–1.030)
pH: 6 (ref 5.0–8.0)

## 2010-11-27 LAB — CBC
HCT: 40.4 % (ref 36.0–46.0)
Hemoglobin: 13.4 g/dL (ref 12.0–15.0)
Platelets: 200 10*3/uL (ref 150–400)
RBC: 4.4 MIL/uL (ref 3.87–5.11)
WBC: 5.1 10*3/uL (ref 4.0–10.5)

## 2010-11-27 LAB — POCT CARDIAC MARKERS: Troponin i, poc: <0.05 ng/mL (ref 0.00–0.09)

## 2010-12-01 LAB — THYROGLOBULIN LEVEL
Antithyroglobulin Ab: <20 IU/mL (ref <20)
Thyroglobulin: <0.2 ng/mL — ABNORMAL LOW (ref 2.0–35.0)

## 2010-12-05 ENCOUNTER — Ambulatory Visit: Payer: Self-pay | Admitting: Internal Medicine

## 2010-12-06 ENCOUNTER — Encounter: Payer: Self-pay | Admitting: Internal Medicine

## 2010-12-06 ENCOUNTER — Ambulatory Visit: Payer: BC Managed Care – PPO | Admitting: Internal Medicine

## 2010-12-08 ENCOUNTER — Ambulatory Visit (INDEPENDENT_AMBULATORY_CARE_PROVIDER_SITE_OTHER): Payer: Medicare Other | Admitting: Internal Medicine

## 2010-12-08 ENCOUNTER — Encounter: Payer: Self-pay | Admitting: Internal Medicine

## 2010-12-08 DIAGNOSIS — M129 Arthropathy, unspecified: Secondary | ICD-10-CM

## 2010-12-08 DIAGNOSIS — F329 Major depressive disorder, single episode, unspecified: Secondary | ICD-10-CM

## 2010-12-08 DIAGNOSIS — F3289 Other specified depressive episodes: Secondary | ICD-10-CM

## 2010-12-08 MED ORDER — AMITRIPTYLINE HCL 10 MG PO TABS: 20.0000 mg | ORAL_TABLET | Freq: Every day | ORAL | Status: DC

## 2010-12-08 MED ORDER — TRAMADOL-ACETAMINOPHEN 37.5-325 MG PO TABS: 1.0000 | ORAL_TABLET | Freq: Two times a day (BID) | ORAL | Status: DC | PRN

## 2010-12-08 NOTE — Progress Notes (Signed)
Subjective:    Patient ID: [name], female    DOB: Oct 20, 1927, 77 y.o.   MRN: [id number]  HPI   here for depression f/u - 6 weeks ago c/o tearfulness and "anxiety"/nervousness  head in "fog" and feels forgetful -  a/w excess physical fatigue and irregular sleep - +hx same, started wellbutrin trial 08/2010 at dtr urging - caused nuasea so stopped med after 4 days -  persisting symptoms so began low dose amitrip the patient reports compliance with medication(s) as prescribed. Denies adverse side effects.  Also reviewed chronic medical issues dyslipidemia - reports compliance with ongoing medical treatment and no changes in medication dose or frequency. denies adverse side effects related to current therapy. no GI or muscle c/o  chronic neck pain - sees ortho and nsurg for same - told the pain is due to arthritis and bone spurs no radiation into arms but feels "tight" - pain causes difficulty with ADLs and sleep position in bed no balance problems or falls pain partially relieved by Indocin buit "never goes away and always comes back" now uses tramadol combo as needed - ?planning steroid shot with nsurg  hx thyroid ca s/p resection 09/2008 for same - subsquent post surgical hypothyroidism -  follows with endo and surg for same - no recent dose changes in thyroid meds no hoarsness or dysphagia, no weight loss - planning for nuc study in 04/2010 per surg ov note 02/02/10  GERD with gastritis - no changes in meds - no current reflux symptoms or abd pain.  prior GI eval revealed gastrtitis (EGD 12/05/09)  CP eval (24h hosp 10/2009) - no recurrent CP, on twice daily PPI - cont nausea and constipation  Past Medical History  Diagnosis Date  . CARCINOMA, THYROID GLAND, PAPILLARY 12/27/2009  . HYPOTHYROIDISM 12/27/2009  . DYSLIPIDEMIA 12/27/2009  . HYPERTENSION 12/26/2009  . CVA 12/27/2009  . GERD 12/27/2009  . PEPTIC ULCER DISEASE 12/27/2009  . ARTHRITIS 12/27/2009  . URINARY INCONTINENCE  12/27/2009  . DDD (degenerative disc disease)     Cervical spine & knees  . DEPRESSION      Review of Systems  Constitutional: Negative for fever and fatigue.  Respiratory: Negative for shortness of breath.   Cardiovascular: Negative for chest pain.  Musculoskeletal: Positive for arthralgias. Negative for back pain.       Objective:   Physical Exam BP 132/70  Pulse 63  Temp(Src) 98.5 F (36.9 C) (Oral)  Ht 5\' 3"  (1.6 m)  Wt 161 lb 6.4 oz (73.211 kg)  BMI 28.59 kg/m2  SpO2 95% Physical Exam  Constitutional: She is oriented to person, place, and time. She appears well-developed and well-nourished. No distress.  Neck: Normal range of motion. Neck supple. No JVD present. No thyromegaly present.  Cardiovascular: Normal rate, regular rhythm and normal heart sounds.  No murmur heard. Pulmonary/Chest: Effort normal and breath sounds normal. No respiratory distress. She has no wheezes.  Psychiatric: She has a normal mood and affect. Her behavior is normal. Judgment and thought content normal.   Lab Results  Component Value Date   WBC 5.0 03/17/2010   HGB 14.2 03/17/2010   HCT 40.9 03/17/2010   PLT 184.0 03/17/2010   CHOL 178 11/30/2009   TRIG 105 11/07/2009   HDL 51 11/30/2009   ALT 15 11/08/2009   AST 16 11/08/2009   NA 144 DELTA CHECK NOTED 11/08/2009   K 4.1 11/08/2009   CL 111 11/08/2009   CREATININE 0.97 11/08/2009   BUN 11  11/08/2009   CO2 28 11/08/2009   TSH 1.26 03/17/2010   HGBA1C  Value: 5.7 (NOTE) The ADA recommends the following therapeutic goal for glycemic control related to Hgb A1c measurement: Goal of therapy: <6.5 Hgb A1c  Reference: American Diabetes Association: Clinical Practice Recommendations 2010, Diabetes Care, 2010, 33: (Suppl  1). 11/07/2009        Assessment & Plan:  See problem list. Medications and labs reviewed today.

## 2010-12-08 NOTE — Patient Instructions (Addendum)
It was good to see you today. Increase dose of amitriptyline as discussed for depression and sleep - Your prescription(s) have been submitted to your pharmacy. Please take as directed and contact our office if you believe you are having problem(s) with the medication(s). Refill on medication(s) as discussed today. Please schedule followup in 4 months for medication review, call sooner if problems.

## 2010-12-08 NOTE — Assessment & Plan Note (Signed)
The current medical regimen is effective;  continue present plan and medications.  Refills done - follows also with ortho for same as needed

## 2010-12-08 NOTE — Assessment & Plan Note (Signed)
exacerbated by home situation (disabled dtr with mood instability at home - PTSD) Complicated by insomnia - Prev tx with wellbutrin not tolerated so changed to amitriptyline Tolerating well but will slightly increase dose now - pt understands and agrees to same

## 2010-12-09 ENCOUNTER — Encounter: Payer: Self-pay | Admitting: Internal Medicine

## 2010-12-12 LAB — COMPREHENSIVE METABOLIC PANEL
ALT: 19 U/L (ref 0–35)
AST: 25 U/L (ref 0–37)
CO2: 27 mEq/L (ref 19–32)
Calcium: 9.3 mg/dL (ref 8.4–10.5)
GFR calc Af Amer: >60 mL/min (ref >60)
Sodium: 139 mEq/L (ref 135–145)
Total Protein: 6.2 g/dL (ref 6.0–8.3)

## 2010-12-12 LAB — DIFFERENTIAL
Basophils Absolute: 0 10*3/uL (ref 0.0–0.1)
Eosinophils Relative: 3 % (ref 0–5)
Lymphocytes Relative: 35 % (ref 12–46)
Monocytes Absolute: 0.5 10*3/uL (ref 0.1–1.0)
Monocytes Relative: 9 % (ref 3–12)

## 2010-12-12 LAB — CBC
MCHC: 33.5 g/dL (ref 30.0–36.0)
RBC: 4.7 MIL/uL (ref 3.87–5.11)

## 2010-12-26 ENCOUNTER — Other Ambulatory Visit: Payer: Self-pay | Admitting: Surgery

## 2011-01-05 ENCOUNTER — Encounter (INDEPENDENT_AMBULATORY_CARE_PROVIDER_SITE_OTHER): Payer: Self-pay | Admitting: Surgery

## 2011-01-09 NOTE — Discharge Summary (Signed)
[name], [name]            ACCOUNT NO.:  [id number]   MEDICAL RECORD NO.:  [id number]          PATIENT TYPE:  INP   LOCATION:  1609                         FACILITY:  [facility]   PHYSICIAN:  Georges Lynch. Gioffre, M.D.DATE OF BIRTH:  05-09-1928   DATE OF ADMISSION:  06/30/2007  DATE OF DISCHARGE:  07/02/2007                               DISCHARGE SUMMARY   ADMISSION DIAGNOSES:  1. Spinal stenosis at L2-3, L3-4, L4-5.  2. History of stroke with resulting vertigo.  3. History of migraines.  4. History of anxiety/depression.  5. History of cardiac valve incontinency, currently being followed by      Dr. Deloris Ping. [name].  6. History of insomnia.  7. History of hypercholesterolemia.   DISCHARGE DIAGNOSES:  1. Decompressive lumbar laminectomy at L2-3, L3-4 and L4-5 for severe      spinal stenosis.  2. History of stroke with vertigo.  3. History of migraines.  4. History of anxiety/depression.  5. History of cardiac valve incontinency, followed by [name].  6. History of insomnia.  7. History of hypercholesterolemia.   HISTORY OF PRESENT ILLNESS:  The patient is a 77 year old female who is  well-known Dr. Darrelyn [name] and who had significant medical workup including  an myelogram, which shows the patient has severe spinal stenosis in the  lumbar region.  The patient has lower back pain, with pain at the  bilateral legs.  She has elected to proceed with a spinal decompression.   ALLERGIES:  CODEINE.   MEDICATIONS ON ADMISSION:  1. Atarax 25 mg p.o. q.h.s. p.r.n.  2. Lovastatin 40 mg a day.  3. Meclizine 25 mg p.r.n.  4. Plavix 75 mg daily.  5. Toradol 10 mg 1 tablet every 4-6 hours p.r.n. pain if needed.  6. Xanax 0.25 mg at night p.r.n.  7. Zantac 150 mg b.i.d.   SURGICAL PROCEDURE:  On June 30, 2007 the patient was taken to the OR  by [name], assisted by Dr. Jene Every.  Under general  anesthesia, the patient underwent a decompressive lumbar  laminectomy at  L2-3, L3-4, L4-5 for severe spinal stenosis; with a complete block at L2-  3.  The patient tolerated the procedure well.  There were no  complications.  Estimated blood loss was 100 mL.  The patient was  transferred to the recovery room and to the orthopedic floor in good  condition, on IV pain medicines for back protocol.   CONSULTATIONS:  The following routine consults were requested:  1. Case management.  2. Physical therapy.   HOSPITAL COURSE:  On June 30, 2007 the patient was admitted to [facility] under the care of [name].  The patient was  taken to the OR, where decompressive lumbar laminectomy was performed.  The patient tolerated the procedure well.  There were no complications.  The patient was transferred to the recovery room and then to the  orthopedic floor in good condition, where she had two days'  postoperative course.  The patient's wound remained benign for any signs  of infection; remained well-approximated with  staples.  She was able to  transition off IV pain medicines to p.o. medications well.  She was  neurologically intact in the lower extremities.  She had decreased  discomfort in the lower extremities and the back.  The patient was able  to participate with physical therapy and was discharged to home in good  condition on postoperative day #2 -- with routine postoperative follow-  up.   LABORATORY STUDIES:  CBC on admission found WBCs 5.1, hemoglobin 14.2,  hematocrit 41.6, platelets at 234.  INR was 1.0 on admission.  Routine  Chemistries found sodium 142, potassium 5.1, glucose 96, BUN 10,  creatinine 1.05 with an estimated GFR at 151.   DISCHARGE INSTRUCTIONS:   DIET:  No restrictions.   ACTIVITY:  The patient is to slowly increase activity with the use of a  walker.   WOUND CARE:  The patient is to change her dressing daily.   FOLLOWUP:  The patient needs a follow-up appointment with Dr. Darrelyn [name] in  the  office in 2 weeks from the date of surgery.  The patient is to call  the office for appointment.   HOME HEALTH:  Through [place].   DISCHARGE MEDICATIONS:  1. Robaxin 500 mg 1 tablet every 6 hours p.r.n.  2. Mepergan fortis 1 tablet every 4-6 hours for pain.  3. Aspirin 325 mg p.o. daily.  4. Plavix 75 mg a day.  5. Lovastatin 40 mg a day.  6. Atarax 25 mg at bedtime p.r.n.  7. Lasix 20 mg once a day.  8. Meclizine 25 mg 1/2 tablet as needed for vertigo.  9. Zantac 150 mg 1 tablet twice a day.  10.Toradol is to be discontinued.   CONDITION ON DISCHARGE:  To home and  improved.      [name], P.A.    ______________________________  Georges Lynch [name], M.D.    RWK/MEDQ  D:  07/23/2007  T:  07/23/2007  Job:  [id number]

## 2011-01-09 NOTE — Consult Note (Signed)
[name]            ACCOUNT NO.:  [id number]   MEDICAL RECORD NO.:  [id number]          PATIENT TYPE:  INP   LOCATION:  1319                         FACILITY:  [facility]   PHYSICIAN:  Georges Lynch. Gioffre, M.D.DATE OF BIRTH:  April 21, 1928   DATE OF CONSULTATION:  DATE OF DISCHARGE:                                 CONSULTATION   REFERRING PHYSICIAN:  Eduard Clos, MD   I was called by Dr. Toniann Fail to evaluate [name].  She was seen  in our office last time on March 09, 2008 for a particular problem with  her neck.  At that time I advised her to have an MRI of her neck.  She  did have x-rays of her neck that showed rather significant changes in  her neck.  The patient also at that time had some problems with her left  wrist.  She had swelling about the left wrist.  She had an MRI of her  left wrist that showed extensor carpi ulnaris tenosynovitis.  Also had  progressive changes in her ulnar styloid that could be secondary to  rheumatoid arthritis.  The patient today on consultation revealed that  she has pain in her neck on the left side.  She had no radicular pain.  This pain was so severe she said that she could not turn her head to one  side.  She had no trauma.  In regards to her back, she had previous  lumbar surgery.  She had a decompressive lumbar laminectomy at several  levels in November of 2008.  She has complained of pain in her right  perilumbar region.  She does not describe any severe radicular pain.  The main reason for her hospitalization by her internist is because of  chronic intractable pain in her neck.   PAST HISTORY:  1. History of CVA.  2. Vertigo.  3. Hyperlipidemia.  4. Low back surgery.   MEDICATIONS:  Were all listed on the chart.   ALLERGIES:  CODEINE.   PHYSICAL EXAMINATION:  NECK:  Today she had limited motion of her neck.  She had pain over the left paracervical region.  She had no gross  spasms.  She did not appear so  extremely uncomfortable today.  There are  no masses or tumors about the neck or the occipital region.  Supraclavicular areas are normal.  Her muscle testing and sensory  exam  in her upper extremities which were normal.  She had no neurological  problems in her lowers.  The exam of her left wrist showed that she had  a synovial cyst consistent with her MRI findings in the past.  The back  per se she had a healed midline incision with no signs of any infection.  She had painful motion of her back.  Her pain was mainly over the right  perilumbar area.  Once again she was in no acute distress with her back.  I reviewed the MRI of her neck.  Her main problem is severe cervical  spondylosis with some spinal stenosis.   IMPRESSION:  Cervical spondylosis, severe.   RECOMMENDATIONS:  1. I called in a prednisone Dosepak for her yesterday.  I was called      to see her and I was very tied up on call that day, but I called in      a prednisone Dosepak to make her comfortable.  2. She should go home on that prednisone Dosepak.  3. We will order a cervical collar for her.  There was no need for me      to transfer her to my service.  I think she can do well enough      alone at home now, and her medical doctor feels that he can      discharge her.           ______________________________  Georges Lynch. [name], M.D.     RAG/MEDQ  D:  07/02/2008  T:  07/02/2008  Job:  [id number]   cc:   Tammy R. Collins Scotland, M.D.  Fax: [phone]

## 2011-01-09 NOTE — Op Note (Signed)
[name], [name]            ACCOUNT NO.:  [id number]   MEDICAL RECORD NO.:  [id number]          PATIENT TYPE:  INP   LOCATION:  1609                         FACILITY:  [facility]   PHYSICIAN:  Georges Lynch. Gioffre, M.D.DATE OF BIRTH:  [birth year]   DATE OF PROCEDURE:  06/30/2007  DATE OF DISCHARGE:                               OPERATIVE REPORT   SURGEON:  Georges Lynch. [name], M.D.   ASSISTANT:  Jene Every, MD   PREOPERATIVE DIAGNOSES:  1. Severe spinal stenosis at L2-L3 with a complete block.  2. Severe spinal stenosis at L3-4.  3. Severe spinal stenosis at L4-5.   POSTOPERATIVE DIAGNOSES:  1. Severe spinal stenosis at L2-L3 with a complete block.  2. Severe spinal stenosis at L3-4.  3. Severe spinal stenosis at L4-5.   OPERATION:  1. Central decompressive complete lumbar laminectomy at L2-L3.  2. Complete lumbar laminectomy at L3-L4.  3. Complete lumbar laminectomy at L4-L5 with foraminotomies at both      levels bilaterally.   PROCEDURE:  Under general anesthesia, the patient on spinal frame,  routine orthopedic prep and drape of the back carried out.  Two needles  were placed in the back for localization purposes.  An x-ray was taken.  At this time, an incision was made over the L2-3, L3-4, L4-5  interspaces.  Bleeders identified and cauterized.  The muscle was  stripped from the lamina and spinous processes bilaterally.  Another x-  ray was taken to verify our position.  Following that, we then carried  out the partial excision of the spinous process of L2.  We excised the  spinous process of L3 and L4 and then did complete central decompressive  type lumbar laminectomies at L2-L3, L3-L4, L4-L5.  The ligamentum flavum  was markedly thickened.  It was gradually removed.  We did bring the  microscope in to do this portion of the procedure, the dura was  protected at all times and we went out laterally and decompressed the  lateral recesses and preserved the facets.  We  then did foraminotomies  bilaterally at both levels as well.  We thoroughly irrigated out the  area.  We were able to easily pass a hockey-stick proximally and  distally and out laterally bilaterally and made sure the roots were free  and the dura was free proximally and distally.  There was no other  spinal stenosis noted that this time.  Thoroughly irrigated out the  area.  I then inserted some FloSeal into the wound site and then removed  a portion of it centrally and then loosely applied some thrombin-soaked  Gelfoam.  I then closed the wound in layers in usual fashion.  I left a  small deep portion of the proximal and distal part of the wound open for  drainage purposes.  At this time, subcu was closed with 0 Vicryl, skin  was closed with metal staples  and sterile Neosporin dressing was applied.  Note a third x-ray was  taken to verify our exact disk space proximally.  We the patient had 1  gram of IV Ancef preop.  She left  the operating room in satisfactory  condition.   ESTIMATED BLOOD LOSS:  About 100 mL.           ______________________________  Georges Lynch. [name], M.D.     RAG/MEDQ  D:  06/30/2007  T:  07/01/2007  Job:  [id number]

## 2011-01-09 NOTE — Discharge Summary (Signed)
[name], [name]            ACCOUNT NO.:  [id number]   MEDICAL RECORD NO.:  [id number]          PATIENT TYPE:  INP   LOCATION:  1319                         FACILITY:  [facility]   PHYSICIAN:  Theodosia Paling, MD    DATE OF BIRTH:  Jul 23, 1928   DATE OF ADMISSION:  06/30/2008  DATE OF DISCHARGE:  07/03/2008                               DISCHARGE SUMMARY   PRIMARY CARE PHYSICIAN:  Tammy R. Collins Scotland, M.D.   ADMITTING HISTORY:  Please refer to the admission note dictated by Dr.  Midge Minium.   HOSPITAL COURSE:  The following issues were addressed during the  hospitalization:  Neck pain.  The patient received an MRI which showed degenerative  disease and spinal stenosis.  [name] who has seen the patient in  the past related to spinal surgery recommended the patient to be on  prednisone pack.  I added lidocaine patch and gave her Percocet for pain  relief.  Also physical therapy evaluation was done.   DISCHARGE DIAGNOSES:  1. Severe neck pain secondary to degenerative joint disease of      cervical spine including spinal stenosis.  2. History of cerebrovascular accident.  3. History of hyperlipidemia.   DISCHARGE MEDICATIONS:  The patient is instructed to continue home  medications which are as follows:  1. Plavix 75 mg p.o. daily.  2. Aspirin 81 mg p.o. daily.  3. Paxil 10 mg p.o. daily.  4. Hydroxyzine 25 mg p.o. q.h.s.  5. Lasix 20 mg p.o. q.8 hours p.r.n.  6. Effexor 37.5 mg p.o. q.12 hours.   New medications include prednisone pack given to the patient by Dr.  [name].  1. Lidocaine patch.  2. Percocet 5/325 mg p.o. q.6 hours p.r.n.   Total time spent in discharge of this patient 45 minutes.   IMAGING PERFORMED:  MRI of the neck which showed multilevel multifocal  advanced degenerative disease and the spinal stenosis without mass  effect.  Multilevel multifactorial neuroforaminal stenosis worse at C7  followed by C6 and 8.  Left hilar 2 cm nodule.  Ultrasound  of the  thyroid gland showed dominant left lower thyroid nodule measuring 2.5 x  1.9 x 2.2 cm showing echotexture similar to possibly thyroid punctate  calcification and internal vascularity.  Subcentimeter left upper pole  and right lobe hypoechoic nodule of thyroid with clinical significance.  Chest x-ray done on July 01, 2008, showed chronic lung disease.   DISPOSITION:  1. The patient will follow up with Dr. Collins Scotland in the clinic for further      evaluation and management of thyroid      nodule which will include thyroid function testing and possible      FNAC of the thyroid mass.  2. The patient will follow up with [name] as an outpatient as      needed for her neck pain and degenerative joint disease.      Theodosia Paling, MD  Electronically Signed     NP/MEDQ  D:  07/03/2008  T:  07/03/2008  Job:  [id number]   cc:   Georges Lynch. [name], M.D.  Fax: [phone]  Tammy R. Collins Scotland, M.D.  Fax: [phone]

## 2011-01-09 NOTE — H&P (Signed)
NAMEJENETTE, [name]            ACCOUNT NO.:  [id number]   MEDICAL RECORD NO.:  [id number]          PATIENT TYPE:  INP   LOCATION:  NA                           FACILITY:  [facility]   PHYSICIAN:  Georges Lynch. Gioffre, M.D.DATE OF BIRTH:  [birth year]   DATE OF ADMISSION:  06/30/2007  DATE OF DISCHARGE:                              HISTORY & PHYSICAL   CHIEF COMPLAINT:  Painful back with pain into bilateral lower  extremities.   HISTORY OF PRESENT ILLNESS:  The patient is 77 year old female who is  well-known to [name] in light of significant medical workup  including myelogram that shows that the patient has severe spinal  stenosis in the lumbar region.  Patient has elected to proceed with a  central decompression at L2-3, 3-4, 4-5 by [name] and Dr. Shelle Iron.   ALLERGIES:  CODEINE   CURRENT MEDICATIONS:  1. Atarax 25 mg one p.o. q.h.s. p.r.n.  2. Lovastatin 40 mg a day.  3. Meclizine 25 mg 1/2 tablet p.o. t.i.d. p.r.n. for vertigo and      dizziness.  4. Plavix 75 mg 1 tablet a day.  5. Toradol 10 mg 1 tablet every 4-6 hours for pain if needed  6. Xanax 0.25 mg 1 tablet at night p.r.n.  7. Zantac 150 mg 1 tablet twice a day.   PAST MEDICAL HISTORY:  1. Includes a stroke in 2000 with subsequent resulting vertigo.  2. History of migraines  3. History of anxiety/depression  4. History of hypercholesterolemia.  5. History of gastroesophageal reflux disease  6. History of cataracts  7. History of leaking cardiac valve unknown, managed by Dr. Elease Hashimoto.   REVIEW OF SYSTEMS:  Negative for any hematologic.  NEUROLOGIC: She did  have a stroke with just  intermittent vertigo periodically, no other  sequelae.  She does have some issues related to some anxiety/depression  but they are well controlled.  Negative PULMONARY issues.  CARDIOVASCULAR:  She denies any chest pain.  She is just being managed  medically from a cardiac heart valve.  She is unsure which one it is, by  Dr. Elease Hashimoto.   She does have a history of GASTROINTESTINAL issues with a  hiatal hernia, ulcers and hemorrhoids.  Otherwise no other issues.  GU:  She recently had a urinary tract infection completed the antibiotics 1  week previous.  She denies any endocrine.   PAST SURGICAL HISTORY:  Includes tonsillectomy, appendectomy,  cholecystectomy and back surgery without any complications.   FAMILY MEDICAL HISTORY:  Father is deceased from a heart attack.  Mother  has history of diabetes.   SOCIAL HISTORY:  Marital status.  The patient is married, caring for her  husband who has Alzheimer's disease in a nursing home.  She does not  smoke or use alcohol.  She got two grown children.  Her daughter will  care for after this surgery.   PHYSICAL EXAM:  The patient is conscious, alert and appropriate, appears  to be a good historian.  Height is 5 feet 3 inches.  Weight is 170,  blood pressure is 142/82, pulse of 74 and regular,  respirations 12,  patient is afebrile.  NECK was supple.  No palpable lymphadenopathy.  She has little range of  motion to the left which is sore cervical spine.  She is able to rotate  her head to the right about 45 degrees.  She is able to touch her chin  to her chest.  She is able to just barely bring it up to about the  neutral position.  CHEST:  Lung sounds were clear and equal bilaterally.  No wheezes,  rales, rhonchi.  HEART:  Regular rate and rhythm.  ABDOMEN:  Soft, nontender.  Bowel sounds present.  EXTREMITIES:  Upper extremities were symmetrically sized and shaped.  She had good range of motion of shoulders, elbows and wrists.  LOWER EXTREMITIES:  She had good range of motion of her hips, knees and  ankles today.  Peripheral vascular carotid pulses were 2+ no bruits.  Radial pulses  were 2+.  She had 2+ dorsalis pedis pulses.  NEURO:  The patient was conscious, alert and appropriate, appeared to be  a good historian.  She had intact light touch sensation bilateral lower   extremities.  She had 4/5 hip flexors right and left.  She had 5/5 knee  extension, ankle extension and great toe extension.  She had a well-  healed lumbar incision.  BREAST, RECTAL AND GU exams were deferred at this time.   IMPRESSION:  1. Spinal stenosis at 2-3, 3-4, 4-5  2. History of stroke with resulting vertigo.  3. History of migraines and headaches.  4. History of anxiety/depression.  5. History of cardiac valve incompetency followed by Dr. Elease Hashimoto.  6. History of insomnia.  7. History of hypercholesterolemia.   PLAN:  The patient will be admitted to [facility] this  afternoon for a central decompressive lumbar laminectomy at L2-3 3-4, 4-  5 [name] and Dr. Shelle Iron.  The patient will undergo all routine labs  and tests prior to this procedure.      [name], P.A.    ______________________________  Georges Lynch [name], M.D.    RWK/MEDQ  D:  06/30/2007  T:  06/30/2007  Job:  [id number]

## 2011-01-09 NOTE — Op Note (Signed)
NAMECHASSIDY, [name]            ACCOUNT NO.:  [id number]   MEDICAL RECORD NO.:  [id number]          PATIENT TYPE:  OIB   LOCATION:  1241                         FACILITY:  [facility]   PHYSICIAN:  Sandria Bales. Ezzard Standing, M.D.  DATE OF BIRTH:  1928-07-11   DATE OF PROCEDURE:  10/01/2008  DATE OF DISCHARGE:                               OPERATIVE REPORT   Date of Surgery ?   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   PROCEDURE:  Total thyroidectomy.   SURGEON:  Sandria Bales. Ezzard Standing, M.D.   FIRST ASSISTANT:  Bertram Savin, M.D.   ANESTHESIA:  General endotracheal anesthesia.   ESTIMATED BLOOD LOSS:  Minimal.   PROCEDURE:  [name] is a 77 year old white female who is a patient  of Dr. Kerri Perches, has been plagued with neck pain.  She was  hospitalized at [facility] in November where she was evaluated by Dr.  Glade Lloyd.  She had a CT and MRI of her neck which showed a 2.5-cm left  thyroid mass with punctate calcifications.   She underwent a needle aspiration biopsy of this on August 24, 2008,  and showed findings consistent with papillary carcinoma.   I discussed with the patient treatment from here which includes a total  thyroidectomy and probable post op I-131 ablation.   The risks of surgery include, but are not limited to, bleeding,  infection, recurrent laryngeal nerve injury, parathyroid injury and  residual tumor or recurrent thyroid cancer.   She also has chronic neck pain which I think is unrelated to this  thyroid cancer.  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  In fact, in doing the surgery, I actually could exacerbate some  of her neck discomfort.   OPERATIVE NOTE:  The patient was placed in a supine position and given a  general endotracheal anesthetic.  Both arms were  tucked to her side.  Her neck was prepped with CHG and a time-out was held identifying the  patient and the procedure.   I made a collar-type incision approximately 2 cm above the clavicle,  going from the edge of the one sternocleidomastoid muscle to the other.  I cut down and elevated the platysma cephalad and down to the sternum.  I then went to the midline and divided the strap muscles in the midline  and got down to the thyroid gland.   I first paid attention to the left thyroid gland because this was where  the mass was.  Her thyroid was fairly posterior.  I was able to roll the  thyroid.  I then isolated the superior thyroid vessels.  I ligated these  twice with 2-0 silk suture and divided it with a Harmonic device.   I then took down the vessels which go to the thyroid along the lateral  aspect of the inferior pole.  I also used a harmonic to divide vessels.  I thought I identified at  least one of the parathyroid glands on the  left side.  Also I saw the recurrent laryngeal nerve coming up under the  thyroid that I spared during the dissection.   After dissecting out the left thyroid gland, I then turned my attention  to the right.  I did not divide the thyroid gland but tried to send the  specimen as a whole.  I carried out the same procedure on the right.  I  identified the superior thyroid vessels on the right side.  I took this  in 2 different bites and put a 2-0 silk tie and a clip on these.  I then  rotated the thyroid medially.  Again, I thought I identified the  recurrent laryngeal diving posteriorly.  I thought I identified at least  the inferior parathyroid gland and spared this during dissection.  I  then rolled the thyroid gland medially and then elevated and removed the  thyroid gland.   The nodules in the left thyroid gland which I oriented in the specimen  and this was sent to pathology.  There was an additional piece of  thyroid tissue I had left in sort of  the bed that was about the size of  a pea.  I took this off and sent it the specimen.  Again, hemostasis was  being controlled primarily with the harmonic device.  I did use some  small baby clips on both sides of the neck and the superior pole.  I  tied the 2-0 silk and clipped.   I palpated the retrosternal area and both fat pads behind the  sternocleidomastoid muscles.  I felt no enlarged or abnormal lymph  nodes.   I then irrigated both sides and gave about 5 minutes on each side to  make sure there was no bleeding from either side.  I then placed  Surgicel in the bed on both sides of the neck.  I then closed the  midline strap muscles with interrupted 3-0 Vicryl suture.  I closed the  platysma muscle with interrupted 3-0 Vicryl suture.  I closed the skin  with a 5-0 Vicryl suture.  I painted the wound tincture of Benzoin and  steri-stripped it.   The patient tolerated the procedure well and was transported to the  recovery room in good condition.  Sponge and needle count were correct  at the end of the case.      Sandria Bales. Ezzard Standing, M.D.  Electronically Signed     DHN/MEDQ  D:  10/01/2008  T:  10/01/2008  Job:  [id number]   cc:   Tammy R. Collins Scotland, M.D.  Fax: [phone]   Llana Aliment. Malon Kindle., M.D.  Fax: [phone]   Georges Lynch. [name], M.D.  Fax: [phone]

## 2011-01-09 NOTE — H&P (Signed)
[name], [name] NO.:  [id number]   MEDICAL RECORD NO.:  [id number]          PATIENT TYPE:  INP   LOCATION:  1319                         FACILITY:  [facility]   PHYSICIAN:  Eduard Clos, MDDATE OF BIRTH:  1928-04-14   DATE OF ADMISSION:  07/01/2008  DATE OF DISCHARGE:                              HISTORY & PHYSICAL   PRIMARY CARE PHYSICIAN:  Tammy R. Collins Scotland, M.D. at [facility]   CHIEF COMPLAINT:  Severe neck pain.   HISTORY OF PRESENT ILLNESS:  A 77 year old female with history of  previous stroke, migraine, low back pain, vertigo, hyperlipidemia, GERD,  presently complains of severe neck pain over the last three days.  The  patient's neck hurts with simple moments.  The patient, in the ER, had a  CT of the neck which showed some degenerative changes.  Despite giving  her some IV Dilaudid and Ativan, the patient is still having unbearable  neck pain.  The patient has been admitted for further evaluation and  management of her neck pain.  The patient denied any trauma, fall.  The  pain started off insidiously and became persistent.  Denies any  associated chest pain, loss of consciousness.  The patient has chronic  dizziness.  Denies any weakness of limbs, incontinence of urine.  Denies  any fever or chills, headache.  Denies any shortness of breath, weakness  of limbs, abdominal pain, dysuria, discharges  or diarrhea.  The patient  is presently mildly drowsy from Dilaudid and Ativan she received at the  ER.   PAST MEDICAL HISTORY:  1. History of CVA.  2. Vertigo.  3. Hyperlipidemia.  4. Low back pain.   PAST SURGICAL HISTORY:  1. Decompressive lumbar laminectomy at L2-L3, L3-L4, L4-L5 on November      2008.  2. Tonsillectomy.  3. Appendectomy.  4. Cholecystectomy.   MEDICATIONS PRIOR TO ADMISSION:  1. Plavix 75 mg p.o. daily.  2. Aspirin 81 mg p.o. daily.  3. Flexeril 10 mg p.o. daily.  4. Hydroxyzine 25 mg p.o. bedtime.  5. 20 mg p.o. daily.  6. Lasix 20 mg p.o. q.8 h p.r.n. dizziness.  7. Tagamet 200 mg p.o. daily.  8. Ultracet p.r.n.  9. effexor 37.5 mg p.o. b.i.d.   ALLERGIES:  CODEINE.   FAMILY HISTORY:  Father had an MI, mother had diabetes mellitus, type 2.   SOCIAL HISTORY:  The patient lives at home.  Denies smoking cigarettes,  drinking alcohol or using illegal drugs.   REVIEW OF SYSTEMS:  As per history of present illness.  Nothing else  significant.   PHYSICAL EXAMINATION:  GENERAL:  Patient examined at bedside, presently  mildly drowsy, arousable, answers questions.  VITAL SIGNS:  Blood pressure 130/85, pulse 87 per minute, temperature  96.7, respirations 18, O2 saturation 93%.  HEENT:  Nonicteric, no pallor.  Tongue is midline.  No facial asymmetry.  The patient's neck particularly in the posterior aspect.  No obvious  swelling or mass or hematoma seen from externally.  CHEST:  Bilateral air entry present.  No rhonchi.  No crepitation.  HEART:  S1 S2  heard.  ABDOMEN:  Soft, nontender.  Bowel sounds heard.  No guarding or  rigidity.  CNS:  The patient is mildly drowsy, but arousable.  The patient has just  received some Dilaudid and Ativan at the ER.  The patient follows  commands and answers questions appropriately.  Moves upper and lower  limbs 5/5.  EXTREMITIES:  Peripheral pulses felt.  No edema.   LABORATORY DATA:  CT of the head and neck shows no acute vascular  pathology.  Atherosclerosis of both carotid bifurcations.  No floor  limiting stenosis.  Stenosis measured at 20% bilaterally, right vertical  artery origin stenosis estimated at 30-40%.  Severe degenerative disease  of the cervical spine, 2.3 cm solid nodule at the inferior thyroid lobe  on the left, ultrasound suggested.  CT of the head shows normal  appearance of the brain with degenerative changes of the c1 and c2 on  the left.  CBC:  WBC is 8.9, hemoglobin 14.8, hematocrit 41.1, platelets  226, neutrophils 76%.   Basic metabolic panel:  Sodium 138, potassium  4.2, chloride 102, carbon dioxide 26, glucose 103, BUN 16, creatinine  0.9, calcium 9.8, CK MB 1.1, troponin I less than 0.05.   ASSESSMENT:  1. Severe intractable neck pain for further evaluation.  2. Thyroid nodule.  3. History of cerebrovascular accident.  4. History of hyperlipidemia.  5. History of low back pain.  6. History of dizziness.   PLAN:  Admit the patient to medical floor.  Will order MRI of the C-  spine, sonogram of the thyroid for further evaluation of thyroid nodule.  Will place the patient on Dilaudid and Percocet as needed for pain.  Resume her home medication.  Further plans and recommendations as MRI  results are available.      Eduard Clos, MD  Electronically Signed     ANK/MEDQ  D:  07/01/2008  T:  07/01/2008  Job:  [id number]

## 2011-01-12 NOTE — Op Note (Signed)
[name], [name]            ACCOUNT NO.:  [id number]   MEDICAL RECORD NO.:  [id number]          PATIENT TYPE:  INP   LOCATION:  0152                         FACILITY:  [facility]   PHYSICIAN:  Mark C. Vernie Ammons, M.D.  DATE OF BIRTH:  December 16, 1927   DATE OF PROCEDURE:  09/21/2005  DATE OF DISCHARGE:                                 OPERATIVE REPORT   PREOPERATIVE DIAGNOSIS:  Stress urinary continence.   POSTOPERATIVE DIAGNOSIS:  Stress urinary continence.   PROCEDURE:  Transobturator sling.   SURGEON:  Mark C. Vernie Ammons, M.D.   ANESTHESIA:  General.   BLOOD LOSS:  5 mL.   DRAINS:  16-French Foley catheter.   SPECIMENS:  None.   COMPLICATIONS:  None.   INDICATIONS:  The patient is a  77 year old white female with pure stress  urinary incontinence by urodynamics. She also had cystocele and was  scheduled to undergo a hysterectomy and posterior repair as well by Dr.  Ambrose Mantle. She has elected to proceed with surgical correction of her stress  incontinence with sling. We discussed the risks, complications and  alternatives with her. She understands and elected to proceed.   DESCRIPTION OF OPERATION:  After informed consent, the patient was brought  to the major OR and placed on the table, administered general anesthesia and  then placed in the dorsal lithotomy position. Genitalia and vagina were  sterilely prepped and draped after which Dr. Ambrose Mantle then performed  transvaginal hysterectomy, posterior and anterior repairs. This was dictated  by him separately.   I then palpated the mid urethral level by placing mild traction on the Foley  catheter. After identifying this location, I then dissected laterally  through the previous incision that Dr. Ambrose Mantle had made in the anterior  vaginal wall lateral to the urethra on each side. I then made stab incisions  5 cm lateral to the clitoris at that level and made sure the bladder was  completely empty. There were only a few mL in the  bladder when I drained it  with the catheter indicating the patient was likely mildly dehydrated after  being n.p.o. after midnight. I then passed the curved transobturator trocars  through the skin incisions, through the obturator fossa and back behind the  symphysis pubis and then directed these out at the mid urethral level by  direct palpation over my index finger. I then affixed the sling material to  the trocars and brought these back through the skin incisions.   The Foley catheter was removed and cystoscopy was performed with both 12 and  70 degree lenses. The bladder was fully inspected and noted be free of any  tumor, stones or inflammatory lesions. Ureteral orifices appeared normal.  Although no efflux was identified, however, it was felt the patient was  dehydrated since there was no urine had collected since the time her Foley  catheter was placed. There was no evidence of injury to the bladder and no  foreign bodies or perforation if the bladder noted.   I then adjusted the sling with its placement at the mid urethral level. The  wound was irrigated with  antibiotic solution and I then removed the plastic  cover to the sling material, first on the left and then right sides  adjusting tension so that there was no tension on the sling and it laid in  good position at the mid urethral level. Excess sling material was excised  at the skin level and the skin incisions were then closed with Dermabond.   Dr. Ambrose Mantle then closed the vaginal incision and with the Foley catheter in  place, the patient will be observed in the hospital with a voiding trial in  the morning.      Mark C. Vernie Ammons, M.D.  Electronically Signed     MCO/MEDQ  D:  09/21/2005  T:  09/21/2005  Job:  [id number]   cc:   Malachi Pro. Ambrose Mantle, M.D.  Fax: [phone]

## 2011-01-12 NOTE — Cardiovascular Report (Signed)
NAMETIFFANNY, LAMARCHE NO.:  [id number]   MEDICAL RECORD NO.:  [id number]          PATIENT TYPE:  OIB   LOCATION:  6501                         FACILITY:  [facility]   PHYSICIAN:  [name], M.D. DATE OF BIRTH:  1928-07-30   DATE OF PROCEDURE:  03/30/2005  DATE OF DISCHARGE:                              CARDIAC CATHETERIZATION   INDICATION:  Ms. Suire is a 77 year old female with a history of chest  pains in the past.  She recently presented with some episodes of chest pain  and some shortness breath.  She was referred for heart catheterization for  further evaluation.   PROCEDURE:  Left heart catheterization with coronary angiography.   TECHNIQUE:  The right femoral artery was easily cannulated using a modified  Seldinger technique.   HEMODYNAMIC RESULTS:  LV pressure was 140/10 with an aortic pressure of  140/70.   ANGIOGRAPHY:  Left main:  The left main is smooth and normal.   The left anterior descending artery is smooth and normal.   The left circumflex artery is also normal.  The first obtuse marginal artery  is normal.   The right coronary artery is large and normal, and dominant.  The  posterolateral branch and the posterior descending branch are normal.   LEFT VENTRICULOGRAM:  The left ventriculogram was performed in the 30 RAO  position.  It reveals normal left ventricular systolic function.  There was  some catheter-induced mitral regurgitation, but there does not appear to be  significant mitral regurgitation at baseline.   COMPLICATIONS:  None.   CONCLUSION:  1.  Smooth and normal coronary arteries.  2.  Normal left ventricular systolic function.   The patient will continue with medical therapy.       PJN/MEDQ  D:  03/30/2005  T:  03/31/2005  Job:  [id number]   cc:   Tammy R. Collins Scotland, M.D.  [address]  [place]  [place] [address]  Fax: [phone]

## 2011-01-12 NOTE — H&P (Signed)
NAME:  [name], [name] NO.:  [id number]   MEDICAL RECORD NO.:  [id number]          PATIENT TYPE:  AMB   LOCATION:                               FACILITY:  [facility]   PHYSICIAN:  [name], M.D. DATE OF BIRTH:  1928/06/23   DATE OF ADMISSION:  DATE OF DISCHARGE:                                HISTORY & PHYSICAL   [name] is an elderly female with a history of chest pain in the past.  She has had several negative Cardiolite studies in the past. She continues  to have episodes of chest pain. This pain is described as a weakness that  goes across her shoulders. She occasionally feels like she is going to pass  out. It is associated with some shortness of breath. These episodes occur at  various times. She says that they occur both at rest and with exertion.  These episodes last for several minutes and then spontaneously resolve. She  has not had any episodes of syncope or presyncope.   CURRENT MEDICATIONS:  1.  Protonix 40 mg daily.  2.  Furosemide 20 mg daily.  3.  Plavix 75 mg daily.  4.  Aspirin 325 mg daily.  5.  Phentermine 30 mg daily.  6.  Lovastatin 40 mg daily.   ALLERGIES:  None.   PAST MEDICAL HISTORY:  1.  History of obesity.  2.  History of hypercholesterolemia.  3.  History of CVA.  4.  Chronic back pain.   SOCIAL HISTORY:  The patient does not smoke. She drinks alcohol  occasionally.   FAMILY HISTORY:  Essentially negative.   REVIEW OF SYSTEMS:  Reviewed and is essentially negative.   PHYSICAL EXAMINATION:  GENERAL:  She is an elderly female in no acute  distress. She is alert and oriented x3. Mood and affect are normal.  VITAL SIGNS:  Weight is 175. Her blood pressure is 160/80 with a heart rate  of 72.  HEENT:  There are 2+ carotids. She has no bruits. No JVD. No thyromegaly.  LUNGS:  Clear to auscultation.  HEART:  Regular rate, S1 and S2.  ABDOMEN:  Good bowel sounds and nontender.  EXTREMITIES:  She has no clubbing,  cyanosis, or edema.  NEUROLOGICAL:  Nonfocal.   IMPRESSION:  [name] presents with chest pains and shoulder pains that  easily could be due to angina. She has had negative stress Cardiolite study  but this does not rule out the possibility of balanced coronary artery  disease. We will schedule her for an outpatient heart catheterization. We  have discussed the risks, benefits, and options of heart catheterization.  She understands and agrees to proceed.           ______________________________  [name], M.D.     PJN/MEDQ  D:  03/21/2005  T:  03/21/2005  Job:  [id number]   cc:   Teena Irani. Arlyce Dice, M.D.  [address]  [place]  [place] [address]  Fax: [phone]

## 2011-01-12 NOTE — Op Note (Signed)
[name], [name]            ACCOUNT NO.:  [id number]   MEDICAL RECORD NO.:  [id number]          PATIENT TYPE:  AMB   LOCATION:  DSC                          FACILITY:  [facility]   PHYSICIAN:  Sandria Bales. Ezzard Standing, M.D.  DATE OF BIRTH:  04/21/1928   DATE OF PROCEDURE:  10/15/2006  DATE OF DISCHARGE:                               OPERATIVE REPORT   PREOPERATIVE DIAGNOSIS:  Right anterior hemorrhoidal tag.   POSTOPERATIVE DIAGNOSIS:  Right anterior hemorrhoidal tag.   PROCEDURE:  Excision of hemorrhoidal tag.   SURGEON:  Sandria Bales. Ezzard Standing, M.D.   ANESTHESIA:  6 mL of 1% Xylocaine.   COMPLICATIONS:  None.   INDICATIONS FOR PROCEDURE:  [name] is a 77 year old white female  who has an annoying tag in her right anterior anus and now comes for  excision of this tag.   OPERATIVE NOTE:  The patient in the left lateral decubitus position.  Her perineum was painted with Betadine solution and sterilely draped.  The perineal tag was infiltrated with 5 mL of 1% Xylocaine.  I cut out  this tag and then sewed the skin closed with three interrupted 2-0  chromic sutures.  She was given Vicodin to go home with.  She will start  soaking tomorrow.  Return to see me in 10-14 days.  Call if any interval  problems.      Sandria Bales. Ezzard Standing, M.D.  Electronically Signed     DHN/MEDQ  D:  10/15/2006  T:  10/16/2006  Job:  [id number]   cc:   Tammy R. Collins Scotland, M.D.

## 2011-01-12 NOTE — Discharge Summary (Signed)
NAMEGENEVIA, [name] NO.:  [id number]   MEDICAL RECORD NO.:  [id number]          PATIENT TYPE:  INP   LOCATION:  1603                         FACILITY:  [facility]   PHYSICIAN:  Malachi Pro. Ambrose Mantle, M.D. DATE OF BIRTH:  1928-04-27   DATE OF ADMISSION:  09/21/2005  DATE OF DISCHARGE:  09/23/2005                                 DISCHARGE SUMMARY   HISTORY:  This is a 77 year old white female, para 2-0-2, who was admitted  for vaginal hysterectomy, A&P repair, sling procedure by Dr. Vernie Ammons. The  patient underwent the vaginal hysterectomy, A&P repair and sling procedure  on September 21, 2005. She did well postoperatively. On the morning of the  second postop day, she did lose her balance in the bathroom and fell against  the wall. She did not fall to the floor. She hit her back and hip area on  the wall and she underwent complete spine x-rays and hip x-rays. There was  no fracture, no evidence of any recent injury. Her catheter was removed on  the first postop day. She has voided satisfactorily. She states that she  needs to dribble when she voids but that is getting better. Her temperature  maximum was 100.3 degrees. She is tolerating a diet well and ambulating  except she does state she has had some problem with balance since 3 months  ago when she thinks she may have had a small stroke.   LABORATORY DATA:  Showed initial hemoglobin of 13.8, hematocrit 40.8, white  count 5500, platelet count 229,000. Follow-up hemoglobins 12.2 and 11.7.  Differential was normal, comprehensive metabolic profile was normal.  Urinalysis was negative. Chest x-ray was normal with no active  cardiopulmonary disease. EKG was normal. X-rays of the total spine and hips  on the day of discharge were negative for acute injury. They did show  osteopenia.   FINAL DIAGNOSES:  Fourth-degree cystocele, second-degree rectocele, stress  urinary incontinence.   OPERATION:  Vaginal hysterectomy, A&P  repair, sling procedure.   FINAL CONDITION:  Improved.   DISCHARGE INSTRUCTIONS:  Instructions include our regular discharge  instructions. No vaginal entrance, no heavy lifting or strenuous activity.  The patient is advised to use her walker at home to improve her balance in  the postoperative period while she is still taking analgesics and if she  continues to have problems with balance she is to see a neurologist. She is  to report any temperature elevation greater than 100.6 degrees, report any  heavy vaginal bleeding, return to see me in 1 week for follow-up  examination.   MEDICATIONS:  Mepergan fortis 1 tablet every 4-6 hours as needed for pain.   FOLLOW UP:  She is to consult with urology to see when she should follow-up  with Dr. Vernie Ammons.      Malachi Pro. Ambrose Mantle, M.D.  Electronically Signed     TFH/MEDQ  D:  09/23/2005  T:  09/24/2005  Job:  [id number]   cc:   Veverly Fells. Vernie Ammons, M.D.  Fax: [phone]

## 2011-01-12 NOTE — Op Note (Signed)
[name], [name] NO.:  [id number]   MEDICAL RECORD NO.:  [id number]          PATIENT TYPE:  INP   LOCATION:  0152                         FACILITY:  [facility]   PHYSICIAN:  Malachi Pro. Ambrose Mantle, M.D. DATE OF BIRTH:  Jan 05, 1928   DATE OF PROCEDURE:  09/21/2005  DATE OF DISCHARGE:                                 OPERATIVE REPORT   PREOPERATIVE DIAGNOSES:  Fourth degree cystocele, second-degree rectocele,  stress urinary incontinence.   POSTOPERATIVE DIAGNOSIS:  Fourth degree cystocele, second-degree rectocele,  stress urinary incontinence.   OPERATION:  Vaginal hysterectomy, A and P repair, sling procedure.   SURGEON:  Malachi Pro. Ambrose Mantle, M.D. and Veverly Fells. Vernie Ammons, M.D.   ASSISTANT:  Zenaida Niece, M.D.   ANESTHESIA:  General anesthesia.   The patient was brought to the operating room and placed under satisfactory  general anesthesia and then placed in lithotomy position in the [place]  stirrups. Exam revealed a fourth degree cystocele, a second-degree  rectocele, the cervix was clean, no adnexal masses were felt. The vulva,  vagina, perineum and urethra were prepped with Betadine solution, a Foley  catheter was inserted to straight drain. The area was then draped as a  sterile field. The cervix was grasped and the cervicovaginal junction was  injected with a dilute solution of Neo-Synephrine. A circumferential  incision was then made around the cervix and the rectum was pushed  posteriorly and the bladder was pushed ahead. It seemed like the rectum was  close to the posterior lower uterine segment so I did a rectal exam and  confirmed that rectum was just right there, so I elected not to enter the  posterior cul-de-sac. I clamped the uterosacral ligaments extraperitoneally,  suture ligated them and then clamped the cardinal ligaments on both sides  and suture ligated them, pushed the bladder well ahead, entered the anterior  peritoneum, retracted the bladder  away and then was able to continue my  dissection posteriorly and enter the peritoneal cavity, clamped, cut and  suture ligated the uterine vessels and continued to go up the sides of the  uterus until the top of the uterus was reached. On the left side, the tube  was not included in the clamp so I clamped  it and then doubly suture  ligated it with #0 Vicryl ties after I had removed the uterus. I could not  feel the ovaries. I think they had retracted superiorly and laterally. The  upper pedicles were doubly suture ligated with #0 Vicryl. The anterior  peritoneum was identified and a pursestring suture of #0 Vicryl was placed  around the pelvis starting anteriorly including the upper pedicle and the  uterosacral ligaments, the cul-de-sac and peritoneum on the left side and  then the same structures on the right side. There was no bleeding  intraperitoneally. I closed the peritoneal cavity off with tying the  pursestring suture down. When I was pulling on the peritoneum, the patient  had vagal reaction down into the 40s and then I began my cystocele repair  and pulling on the anterior vaginal mucosa caused bradycardia, but it  recovered. I developed the cystocele, imbricated the cystocele with  interrupted sutures of #0 Vicryl, cut away redundant vaginal mucosa and then  starting at the vaginal cuff I reunited the vaginal mucosa in the midline  with interrupted figure-of-eight sutures of #0 Vicryl. I left part of the  anterior vaginal mucosa opened so Dr. Vernie Ammons would have room to work. I cut  across the posterior vaginal fourchette and then dissected the posterior  vaginal mucosa all the way to the cuff by creating a space between the  vaginal mucosa and the fascia propria of the rectum. I then obliterated the  rectocele with several interrupted sutures of #0 Vicryl, cut away redundant  vaginal mucosa and then reunited the vaginal mucosa in the midline with  interrupted figure-of-eight  sutures of #0 Vicryl. At this point, Dr. Vernie Ammons  came in and performed his sling procedure which he will dictate separately.  Then I reentered the field, closed the remainder of the vaginal mucosa and  there were two separate areas in the vaginal mucosa that were bleeding. I  controlled them with 3-0 Vicryl. Hemostasis was now adequate. I packed the  vagina with a 2-inch Iodoform pack and the procedure was terminated. The  patient tolerated the procedure well. Blood loss was about 400 mL. Sponge  and needle counts were correct and she was returned to recovery in  satisfactory condition.      Malachi Pro. Ambrose Mantle, M.D.  Electronically Signed     TFH/MEDQ  D:  09/21/2005  T:  09/21/2005  Job:  [id number]   cc:   Veverly Fells. Vernie Ammons, M.D.  Fax: [phone]

## 2011-01-12 NOTE — H&P (Signed)
[name], [name] NO.:  [id number]   MEDICAL RECORD NO.:  [id number]          PATIENT TYPE:  INP   LOCATION:  NA                           FACILITY:  [facility]   PHYSICIAN:  [name], M.D. DATE OF BIRTH:  1928-07-13   DATE OF ADMISSION:  09/21/2005  DATE OF DISCHARGE:                                HISTORY & PHYSICAL   HISTORY OF PRESENT ILLNESS:  This is a 77 year old white married female,  para 2, 0-0-2, who is admitted to the hospital for vaginal hysterectomy, A&P  repair, and sling procedure because of 4th degree cystocele that is  symptomatic and some stress urinary incontinence.  This patient was seen in  September, 2006 and wanted to talk about her bladder.  She stated that two  years prior, she had noticed a heaviness in her pelvis and would feel a  protrusion from her vagina.  She saw Dr. Clelia [name], who has since retired.  The  patient stated that she had stress urinary incontinence when her bladder was  full.  She was examined, and a 4th degree cystocele was confirmed.  She was  offered a pessary, observation, or surgery.  She chose to have surgery, and  this was the convenient time.  The patient was seen by Dr. Vernie [name].  He  found a cystocele, loss of ureterovesical junction, and the greatest  component of her incontinence was stress incontinence.  She underwent  urodynamics.  She voided 321 cc with normal, unobstructive flow pattern at  16.5 ml/sec and had a residual urine of 40 cc.  A cystometrogram revealed a  capacity of 647 cc.  There was no bladder instability.  Leak point pressure  was then determined with 200 cc in the bladder and was noted to be 118 cm of  water.  Dr. Margrett [name] impression was that she had no evidence of bladder  instability.  She did have an elevated bladder capacity.  He did not think  she would have a problem with the sling and felt that she should have sling  procedure since she had documented stress urinary  incontinence.   PAST MEDICAL HISTORY:  Allergy to CODEINE and DARVOCET, both of which cause  nausea and vomiting.   MEDICATIONS:  1.  Plavix 75 mg 1 daily.  2.  Furosemide 20 mg 1 daily.  3.  Lovastatin 40 mg a day.  4.  Protonix 40 mg a day.  5.  Aspirin 1 daily.   OPERATIONS:  Tonsillectomy, appendectomy, back surgery, gallbladder,  cataract with implant.   ILLNESSES:  The patient did have a stroke five years ago.  Has no disability  from it.  She has no known heart problems.   She drinks socially.  Does not smoke.   REVIEW OF SYSTEMS:  Some dizziness and constipation.  Mother died at 67 of a  stroke.  Father died at 19 of a heart attack.  Three sisters are living.  One has breast cancer and Alzheimer's disease.  One brother is dead.   The patient had two vaginal deliveries. She has had no sexual activity in 10-  15 years and never intends to have sex again.   PHYSICAL EXAMINATION:  VITAL SIGNS:  Blood pressure 140/70, pulse 80. Weight  176 pounds.  GENERAL:  A well-developed, well-nourished and preserved white female in no  distress.  HEENT:  No cranial abnormalities.  Extraocular movements are intact.  Nose  and pharynx are clear.  NECK:  Supple without thyromegaly.  LUNGS:  Clear to auscultation.  HEART:  Normal size and sounds.  No murmurs.  ABDOMEN:  Soft and nontender.  No masses are palpable.  The liver, spleen,  and kidneys are not felt.  PELVIC:  The patient had a Pap smear in July, 2006 that she said was okay.  The vulva and vagina show no masses.  There is a fourth degree cystocele.  The cervix is clean.  The uterus is mid, plain, and small.  Adnexa are  clear.  There is a smaller rectocele.  RECTAL:  The rectal exam done in September, 2006 is negative.   The patient does seem to be tender when touching her over her chest,  abdomen, and pelvis, but she has no acute illness.   ADMITTING IMPRESSION:  A 4th degree cystocele, smaller rectocele, stress  urinary  incontinence.  The patient is admitted for vaginal hysterectomy, A&P  repair, and sling procedure.  The patient understands the risks of surgery,  including but not limited to heart attack, stroke, pulmonary embolus, wound  disruption, hemorrhage with need for reoperation and/or transfusion, nerve  injury, intestinal obstruction.  She understands and agrees to proceed.      [name], M.D.  Electronically Signed     TFH/MEDQ  D:  09/20/2005  T:  09/20/2005  Job:  [id number]   cc:   [name]. [name], M.D.  Fax: [phone]

## 2011-02-14 ENCOUNTER — Ambulatory Visit (INDEPENDENT_AMBULATORY_CARE_PROVIDER_SITE_OTHER): Payer: Medicare Other | Admitting: Internal Medicine

## 2011-02-14 ENCOUNTER — Encounter: Payer: Self-pay | Admitting: Internal Medicine

## 2011-02-14 DIAGNOSIS — W57XXXA Bitten or stung by nonvenomous insect and other nonvenomous arthropods, initial encounter: Secondary | ICD-10-CM

## 2011-02-14 DIAGNOSIS — L299 Pruritus, unspecified: Secondary | ICD-10-CM

## 2011-02-14 DIAGNOSIS — T148XXA Other injury of unspecified body region, initial encounter: Secondary | ICD-10-CM

## 2011-02-14 DIAGNOSIS — IMO0001 Reserved for inherently not codable concepts without codable children: Secondary | ICD-10-CM

## 2011-02-14 MED ORDER — FLUOCINONIDE-E 0.05 % EX CREA: TOPICAL_CREAM | Freq: Two times a day (BID) | CUTANEOUS | Status: DC

## 2011-02-14 MED ORDER — DOXYCYCLINE HYCLATE 100 MG PO TABS: 100.0000 mg | ORAL_TABLET | Freq: Two times a day (BID) | ORAL | Status: AC

## 2011-02-14 NOTE — Patient Instructions (Addendum)
It was good to see you today. Doxycycline antibiotics and steroid for itch as discussed - Your prescription(s) have been submitted to your pharmacy. Please take as directed and contact our office if you believe you are having problem(s) with the medication(s). Stay away from the "tick farm"! Call if unimproved after 7-10 days of treatment, sooner if worse

## 2011-02-14 NOTE — Progress Notes (Signed)
  Subjective:    Patient ID: [name], female    DOB: 1927-11-25, 77 y.o.   MRN: [id number]  HPI  complains of tick bites Precipitated by outdoor exposure - visiting a friend[place] farm 4 days ago dtr reports pt "covered" with ticks over lower 1/2 of body upon return home Showered but many remained attached dtr removed "tiny" ticks before ticks became swollen Now skin is red and swollen area at sites of attachment - onset within 24h following removal associated with itch No drainage, no pain, no fever No generalized rash, joint pains or headache   Past Medical History  Diagnosis Date  . CVA   . ARTHRITIS   . URINARY INCONTINENCE   . DEPRESSION   . DDD (degenerative disc disease)     Cervical spine & knees  . GERD   . DYSLIPIDEMIA   . HYPERTENSION   . HYPOTHYROIDISM   . PEPTIC ULCER DISEASE 11/2009  . 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  Respiratory: Negative for cough.   Musculoskeletal: Negative for arthralgias.       Objective:   Physical Exam BP 142/62  Pulse 65  Temp(Src) 98 F (36.7 C) (Oral)  Ht 5\' 3"  (1.6 m)  Wt 158 lb (71.668 kg)  BMI 27.99 kg/m2  SpO2 95% Physical Exam  Constitutional: She is oriented to person, place, and time. She appears well-developed and well-nourished. No distress.  Cardiovascular: Normal rate, regular rhythm and normal heart sounds.  No murmur heard. No BLE edema. Pulmonary/Chest: Effort normal and breath sounds normal. No respiratory distress. She has no wheezes.   Skin: Skin is warm and dry. No generalized rash noted. Indurated skin with surrounding small ring of erythema at bite sites: B inner groin, buttock folds and thighs. no abscess Psychiatric: She has a normal mood and affect. Her behavior is normal. Judgment and thought content normal.        Assessment & Plan:  Tick bite, infected - early cellulitis - associated with itch  likely due to hypersensitivity reaction - tx doxy and steroid cream - erx done To call if unimproved, sooner if worse

## 2011-02-19 ENCOUNTER — Encounter (INDEPENDENT_AMBULATORY_CARE_PROVIDER_SITE_OTHER): Payer: Self-pay | Admitting: Surgery

## 2011-02-21 ENCOUNTER — Ambulatory Visit (INDEPENDENT_AMBULATORY_CARE_PROVIDER_SITE_OTHER): Payer: Medicare Other | Admitting: Surgery

## 2011-02-21 ENCOUNTER — Encounter (INDEPENDENT_AMBULATORY_CARE_PROVIDER_SITE_OTHER): Payer: Self-pay | Admitting: Surgery

## 2011-02-21 VITALS — Wt 158.8 lb

## 2011-02-21 DIAGNOSIS — Z85850 Personal history of malignant neoplasm of thyroid: Secondary | ICD-10-CM

## 2011-02-21 NOTE — Patient Instructions (Signed)
You are doing very well from the standpoint of the thyroid cancer.  Watch out for ticks.  Follow up with me ini 6 months.

## 2011-02-21 NOTE — Progress Notes (Signed)
Subjective:     Patient ID: [name], female   DOB: 1928/07/24, 77 y.o.   MRN: [id number]    Wt 158 lb 12.8 oz (72.031 kg)    HPI [name] is an 77 YO WF who had a total thyroidectomy by me on 10/01/2008 for a 2.3 cm follicular variant of a papillary carcinoma with negative margins.  She had a thyrogen stimulated whole body scan on 04/29/2009 that was negative.  Dr. Willey Blade is her PCP and saw her about one week ago for tick bites.  Dr. Assunta Found sees her for endocrine and she is on 88 mcg of Synthroid and her labs are being followed by Dr. Talmage Nap.  Her main complain is left neck pain.  She sees Dr. Danielle Dess for neuropathy involving her neck and her legs.  She did not want an injection suggested for her neck by Dr. Danielle Dess.  Her left leg is bothering her more than her right leg today.  Review of Systems     Objective:   Physical Exam Neck:  Her neck is stiff and she has trouble turning her head even a few degrees to the left or right.  I feel no mass. Lymph nodes:  I feel no cervical or supraclavicular nodes. Lungs:  Clear to auscultation.    Assessment:     1.  Follicular variant of papillary ca, T2 lesion.  Disease free at this time. 2.  Thyroid function tests followed by Dr. Assunta Found.  On stable synthroid dose. 3.  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 4.  Lower extremity neuropathy, left > right.    Plan:     Follow up in 6 months.

## 2011-02-24 ENCOUNTER — Encounter: Payer: Self-pay | Admitting: Internal Medicine

## 2011-04-11 ENCOUNTER — Ambulatory Visit (INDEPENDENT_AMBULATORY_CARE_PROVIDER_SITE_OTHER): Payer: Medicare Other | Admitting: Internal Medicine

## 2011-04-11 ENCOUNTER — Encounter: Payer: Self-pay | Admitting: Internal Medicine

## 2011-04-11 ENCOUNTER — Other Ambulatory Visit (INDEPENDENT_AMBULATORY_CARE_PROVIDER_SITE_OTHER): Payer: Medicare Other

## 2011-04-11 DIAGNOSIS — R5383 Other fatigue: Secondary | ICD-10-CM

## 2011-04-11 DIAGNOSIS — F329 Major depressive disorder, single episode, unspecified: Secondary | ICD-10-CM

## 2011-04-11 DIAGNOSIS — E785 Hyperlipidemia, unspecified: Secondary | ICD-10-CM

## 2011-04-11 DIAGNOSIS — R5381 Other malaise: Secondary | ICD-10-CM

## 2011-04-11 DIAGNOSIS — E039 Hypothyroidism, unspecified: Secondary | ICD-10-CM

## 2011-04-11 DIAGNOSIS — M542 Cervicalgia: Secondary | ICD-10-CM

## 2011-04-11 LAB — CBC WITH DIFFERENTIAL/PLATELET
Basophils Relative: 0.5 % (ref 0.0–3.0)
Eosinophils Relative: 2.4 % (ref 0.0–5.0)
HCT: 42.5 % (ref 36.0–46.0)
Hemoglobin: 14.2 g/dL (ref 12.0–15.0)
Lymphs Abs: 1.8 10*3/uL (ref 0.7–4.0)
MCV: 91 fl (ref 78.0–100.0)
Monocytes Absolute: 0.4 10*3/uL (ref 0.1–1.0)
Neutro Abs: 4.3 10*3/uL (ref 1.4–7.7)
Platelets: 216 10*3/uL (ref 150.0–400.0)
WBC: 6.7 10*3/uL (ref 4.5–10.5)

## 2011-04-11 LAB — BASIC METABOLIC PANEL
BUN: 16 mg/dL (ref 6–23)
CO2: 30 mEq/L (ref 19–32)
Calcium: 9.1 mg/dL (ref 8.4–10.5)
Creatinine, Ser: 1.1 mg/dL (ref 0.4–1.2)
GFR: 52.9 mL/min — ABNORMAL LOW (ref >60.00)
Glucose, Bld: 88 mg/dL (ref 70–99)
Sodium: 141 mEq/L (ref 135–145)

## 2011-04-11 LAB — HEPATIC FUNCTION PANEL
ALT: 18 U/L (ref 0–35)
AST: 20 U/L (ref 0–37)
Alkaline Phosphatase: 81 U/L (ref 39–117)
Bilirubin, Direct: 0.1 mg/dL (ref 0.0–0.3)
Total Bilirubin: 0.8 mg/dL (ref 0.3–1.2)

## 2011-04-11 LAB — LIPID PANEL: Total CHOL/HDL Ratio: 3

## 2011-04-11 MED ORDER — INDOMETHACIN 25 MG PO CAPS: 25.0000 mg | ORAL_CAPSULE | Freq: Every day | ORAL | Status: DC

## 2011-04-11 NOTE — Assessment & Plan Note (Signed)
exacerbated by home situation (disabled dtr with mood instability at home - PTSD) Complicated by insomnia - Prev tx with wellbutrin not tolerated so changed to amitriptyline Tolerating well

## 2011-04-11 NOTE — Assessment & Plan Note (Signed)
Chronic, prior ortho and nsurg eval - uses tramadol and robxin dailyt - refills provided - no neuro deficits - support offered

## 2011-04-11 NOTE — Progress Notes (Signed)
Subjective:    Patient ID: [name], female    DOB: [birth year], 77 y.o.   MRN: [id number]  HPI here for follow up - reviewed chronic medical issues:  Depression: Manifest with tearfulness and "anxiety"/nervousnes; head in "fog" and forgetful -  a/w excess physical fatigue and irregular sleep - started wellbutrin trial 08/2010 at dtr urging - caused nuasea so indep stopped med after 4 days -  persisting symptoms so began low dose amitrip - takes "as i need to for my mood and to sleep" the patient reports compliance with medication(s) as prescribed. Denies adverse side effects.  dyslipidemia - reports compliance with ongoing medical treatment and no changes in medication dose or frequency. denies adverse side effects related to current therapy. no GI or muscle aches  chronic neck pain - prior ortho and nsurg for same - told the pain is due to arthritis and bone spurs no radiation into arms but feels "tight" - pain causes difficulty with ADLs and sleep position in bed no balance problems or falls pain partially relieved by Indocin buit "never goes away and always comes back" now uses tramadol combo as needed - ?planning steroid shot with nsurg  hx thyroid ca s/p resection 09/2008 for same - subsquent post surgical hypothyroidism -  follows with endo and surg for same - no recent dose changes in thyroid meds no hoarsness or dysphagia, no weight loss - planning for nuc study in 04/2010 per surg ov note 02/02/10  GERD with gastritis - no changes in meds - no current reflux symptoms or abd pain.  prior GI eval revealed gastrtitis (EGD 12/05/09); prior CP eval (24h hosp 10/2009) related to same - no recurrent CP, on twice daily PPI - cont nausea and constipation  Past Medical History  Diagnosis Date  . CVA   . ARTHRITIS   . URINARY INCONTINENCE   . DEPRESSION   . DDD (degenerative disc disease)     Cervical spine & knees  . GERD   . DYSLIPIDEMIA   . HYPERTENSION   . HYPOTHYROIDISM    . PEPTIC ULCER DISEASE 11/2009  . CARCINOMA, THYROID GLAND, PAPILLARY 09/2008    thyroidectomy     Review of Systems  Constitutional: Positive for fatigue. Negative for fever.  Respiratory: Negative for shortness of breath.   Cardiovascular: Negative for chest pain.  Musculoskeletal: Positive for arthralgias. Negative for back pain.       Objective:   Physical Exam  BP 136/80  Pulse 62  Temp(Src) 98.3 F (36.8 C) (Oral)  Ht 5\' 3"  (1.6 m)  Wt 157 lb 6.4 oz (71.396 kg)  BMI 27.88 kg/m2  SpO2 98% Constitutional: She is oriented to person, place, and time. She appears well-developed and well-nourished. No distress.  Neck: Normal range of motion. Neck supple. No JVD present. No thyromegaly present.  Cardiovascular: Normal rate, regular rhythm and normal heart sounds.  No murmur heard. no BLE edema Pulmonary/Chest: Effort normal and breath sounds normal. No respiratory distress. She has no wheezes.  Mskel - neck with FROM, Back: full range of motion of thoracic and lumbar spine. Non tender to palpation. Negative straight leg raise. DTR's are symmetrically intact. Sensation intact -Full strength to manual muscle testing; ambulates with normal gait. Psychiatric: She has a normal mood and affect. Her behavior is normal. Judgment and thought content normal.   Lab Results  Component Value Date   WBC 5.0 03/17/2010   HGB 14.2 03/17/2010   HCT 40.9 03/17/2010   PLT  184.0 03/17/2010   CHOL 178 11/30/2009   TRIG 105 11/07/2009   HDL 51 11/30/2009   ALT 15 11/08/2009   AST 16 11/08/2009   NA 144 DELTA CHECK NOTED 11/08/2009   K 4.1 11/08/2009   CL 111 11/08/2009   CREATININE 0.97 11/08/2009   BUN 11 11/08/2009   CO2 28 11/08/2009   TSH 1.26 03/17/2010   HGBA1C  Value: 5.7 (NOTE) The ADA recommends the following therapeutic goal for glycemic control related to Hgb A1c measurement: Goal of therapy: <6.5 Hgb A1c  Reference: American Diabetes Association: Clinical Practice Recommendations 2010, Diabetes  Care, 2010, 33: (Suppl  1). 11/07/2009        Assessment & Plan:  See problem list. Medications and labs reviewed today.

## 2011-04-11 NOTE — Assessment & Plan Note (Signed)
On statin - denies symptoms - check lipids now 

## 2011-04-11 NOTE — Patient Instructions (Signed)
It was good to see you today. Test(s) ordered today. Your results will be called to you after review (48-72hours after test completion). If any changes need to be made, you will be notified at that time. Medications reviewed, no changes at this time. Refill on medication(s) as discussed today. Please schedule followup in 6 months for medication review, call sooner if problems.

## 2011-04-11 NOTE — Assessment & Plan Note (Signed)
Follows with endo (balan) for same - reports recent labs "normal" The current medical regimen is effective;  continue present plan and medications.

## 2011-05-25 ENCOUNTER — Other Ambulatory Visit: Payer: Self-pay | Admitting: Internal Medicine

## 2011-05-25 ENCOUNTER — Other Ambulatory Visit: Payer: Self-pay | Admitting: *Deleted

## 2011-05-25 MED ORDER — ZOLPIDEM TARTRATE 5 MG PO TABS: 5.0000 mg | ORAL_TABLET | Freq: Every evening | ORAL | Status: DC | PRN

## 2011-05-25 NOTE — Telephone Encounter (Signed)
Faxed script back to [facility] @ [phone].Marland KitchenMarland Kitchen9/28/12@4 :48pm/LMB

## 2011-05-29 ENCOUNTER — Encounter: Payer: Self-pay | Admitting: Internal Medicine

## 2011-05-29 LAB — CBC
HCT: 40.8
Hemoglobin: 13.9
MCHC: 33.6
MCV: 90.7
Platelets: 223
Platelets: 226
RBC: 4.64
RDW: 14.4
RDW: 14.4
WBC: 8.9

## 2011-05-29 LAB — BASIC METABOLIC PANEL
BUN: 13
BUN: 16
Calcium: 8.8
Calcium: 9.8
Chloride: 107
Creatinine, Ser: 0.9
GFR calc non Af Amer: 59 — ABNORMAL LOW
GFR calc non Af Amer: 60 — ABNORMAL LOW
GFR calc non Af Amer: >60
Glucose, Bld: 124 — ABNORMAL HIGH
Glucose, Bld: 143 — ABNORMAL HIGH
Potassium: 4.2
Sodium: 136

## 2011-05-29 LAB — POCT CARDIAC MARKERS
Myoglobin, poc: 77.3
Myoglobin, poc: 81.8
Troponin i, poc: <0.05

## 2011-05-29 LAB — DIFFERENTIAL
Lymphocytes Relative: 13
Lymphs Abs: 1.2
Neutrophils Relative %: 76

## 2011-05-29 LAB — SEDIMENTATION RATE: Sed Rate: 1

## 2011-05-29 LAB — LIPID PANEL
Cholesterol: 200
LDL Cholesterol: 143 — ABNORMAL HIGH
Total CHOL/HDL Ratio: 4.5

## 2011-06-05 LAB — DIFFERENTIAL
Eosinophils Absolute: 0
Eosinophils Relative: 1
Lymphocytes Relative: 23
Lymphs Abs: 1.2
Monocytes Relative: 6

## 2011-06-05 LAB — APTT: aPTT: 31

## 2011-06-05 LAB — URINALYSIS, ROUTINE W REFLEX MICROSCOPIC
Glucose, UA: NEGATIVE
Hgb urine dipstick: NEGATIVE
Ketones, ur: NEGATIVE
Protein, ur: NEGATIVE
Urobilinogen, UA: 0.2

## 2011-06-05 LAB — COMPREHENSIVE METABOLIC PANEL
ALT: 21
AST: 24
Albumin: 4
BUN: 10
Chloride: 107
GFR calc non Af Amer: 51 — ABNORMAL LOW
Glucose, Bld: 96
Potassium: 5.1
Total Protein: 6.9

## 2011-06-05 LAB — PROTIME-INR
INR: 1
Prothrombin Time: 13.8

## 2011-06-05 LAB — CBC
HCT: 41.6
MCHC: 34
MCV: 88
Platelets: 234
RBC: 4.73

## 2011-06-05 LAB — TYPE AND SCREEN: ABO/RH(D): AB NEG

## 2011-06-13 ENCOUNTER — Encounter: Payer: Self-pay | Admitting: Cardiovascular Disease

## 2011-06-18 ENCOUNTER — Ambulatory Visit (INDEPENDENT_AMBULATORY_CARE_PROVIDER_SITE_OTHER): Payer: Medicare Other | Admitting: Cardiovascular Disease

## 2011-06-18 ENCOUNTER — Encounter: Payer: Self-pay | Admitting: Cardiovascular Disease

## 2011-06-18 DIAGNOSIS — R079 Chest pain, unspecified: Secondary | ICD-10-CM | POA: Insufficient documentation

## 2011-06-18 NOTE — Assessment & Plan Note (Signed)
She continues to have occasional episodes of some non-cardiac chest pain. We performed a normal heart catheterization in 2006.  We'll continue to follow her on a yearly basis. I'll see her sooner if needed.

## 2011-06-18 NOTE — Progress Notes (Signed)
[name] Date of Birth  May 24, 1928 [facility] [address]    [address] [place], [place]  [address] [phone]  Fax  [phone]  History of Present Illness:  77 year old female with a history of chest pains and has been shown normal heart catheterization in August of 2006.  She continues to have some episodes of chest pain. She typically has chest pains at night. They resolve spontaneously.  Current Outpatient Prescriptions on File Prior to Visit  Medication Sig Dispense Refill  . ALPRAZolam (XANAX) 0.5 MG tablet Take 0.5 mg by mouth at bedtime as needed.       . clopidogrel (PLAVIX) 75 MG tablet Take 1 by mouth daily      . clopidogrel (PLAVIX) 75 MG tablet TAKE 1 TABLET EVERY DAY  30 tablet  5  . levothyroxine (SYNTHROID, LEVOTHROID) 88 MCG tablet Take 88 mcg by mouth daily.        . methocarbamol (ROBAXIN) 500 MG tablet Take 500 mg by mouth daily.       . pantoprazole (PROTONIX) 40 MG tablet Take 40 mg by mouth 2 (two) times daily.        . pravastatin (PRAVACHOL) 40 MG tablet Take 40 mg by mouth 2 (two) times daily.        . promethazine (PHENERGAN) 12.5 MG tablet Take 12.5 mg by mouth every 8 (eight) hours as needed.        . traMADol-acetaminophen (ULTRACET) 37.5-325 MG per tablet Take 1 tablet by mouth 2 (two) times daily as needed. For pain  40 tablet  2  . zolpidem (AMBIEN) 5 MG tablet Take 1 tablet (5 mg total) by mouth at bedtime as needed for sleep.  30 tablet  6  . DISCONTD: amitriptyline (ELAVIL) 10 MG tablet Take 2 tablets (20 mg total) by mouth at bedtime.  60 tablet  3  . DISCONTD: fluocinonide-emollient (LIDEX-E) 0.05 % cream Apply topically 2 (two) times daily.  30 g  0  . DISCONTD: indomethacin (INDOCIN) 25 MG capsule Take 1 capsule (25 mg total) by mouth daily. As needed  60 capsule  1    Allergies  Allergen Reactions  . Iodine     REACTION: Nausea    Past Medical History  Diagnosis Date  . CVA   . ARTHRITIS   . URINARY INCONTINENCE     . DEPRESSION   . DDD (degenerative disc disease)     Cervical spine & knees  . GERD   . DYSLIPIDEMIA   . HYPERTENSION   . HYPOTHYROIDISM   . PEPTIC ULCER DISEASE 11/2009  . CARCINOMA, THYROID GLAND, PAPILLARY 09/2008    thyroidectomy  . Palpitations   . Dizziness   . Dyspnea     Past Surgical History  Procedure Date  . Cholecystectomy   . Abdominal hysterectomy   . Thyroidectomy 2/30/10    papillary, s/p total  . Breast biopsy   . Back surgery   . Tonsillectomy   . Appendectomy   . Incontinence surgery   . Wrist surgery     LEFT  . Cardiac catheterization 03/30/2005    NORMAL EF. NORMAL CORONARIES  . Cardiovascular stress test 01/27/2003    NO EVIDENCE OF ISCHEMIA. EF 74%  . US echocardiography 03/16/2009    EF 55-60%  . US echocardiography 01/26/2003    EF 60-65%    History  Smoking status  . Never Smoker   Smokeless tobacco  . Not on file  Comment: widowed, lives with  daughter who is an addict. Retired, enjoying gardening    History  Alcohol Use No    Family History  Problem Relation Age of Onset  . Arthritis Mother   . Diabetes Mother   . Hyperlipidemia Mother   . Arthritis Father   . Diabetes Father   . Hyperlipidemia Father   . Breast cancer Maternal Grandmother     Reviw of Systems:  Reviewed in the HPI.  All other systems are negative.  Physical Exam: BP 153/80  Pulse 68  Ht 5' 3.5" (1.613 m)  Wt 160 lb 12.8 oz (72.938 kg)  BMI 28.04 kg/m2 The patient is alert and oriented x 3.  The mood and affect are normal.   Skin: warm and dry.  Color is normal.    HEENT:   There is no JVD. Her carotids are 2+.  Lungs: Clear   Heart: Regular rate, shows no murmurs.    Abdomen: Good bowel sounds. There is no hepatosplenomegaly or  Extremities:  No edema  Neuro:  Nonfocal. Her gait is normal.     ECG: Normal sinus rhythm. She has a left axis deviation. There is a pulmonary disease pattern.   Assessment / Plan:

## 2011-06-18 NOTE — Patient Instructions (Addendum)
Your physician wants you to follow-up in: 1 year  You will receive a reminder letter in the mail two months in advance. If you don't receive a letter, please call our office to schedule the follow-up appointment.  Your physician recommends that you continue on your current medications as directed. Please refer to the Current Medication list given to you today.  

## 2011-07-10 ENCOUNTER — Telehealth: Payer: Self-pay

## 2011-07-10 NOTE — Telephone Encounter (Signed)
I read the note/office visit from Dr. Ezzard Standing from June 2012 regarding evaluation for history of thyroid cancer, and there is no mention of needing repeat scan.  I would defer to her endocrinologist Dr. Talmage Nap or to her surgeon Dr. Ezzard Standing regarding necessity or scheduling of this test if needed. Thanks

## 2011-07-10 NOTE — Telephone Encounter (Signed)
Pt called stating that she was told that she would need to have a body scan because she had thyroid cancer. Pt is requesting advisement on if this needs to be done and when, please advise.

## 2011-07-11 ENCOUNTER — Ambulatory Visit (INDEPENDENT_AMBULATORY_CARE_PROVIDER_SITE_OTHER): Payer: Medicare Other

## 2011-07-11 DIAGNOSIS — Z23 Encounter for immunization: Secondary | ICD-10-CM

## 2011-07-11 NOTE — Telephone Encounter (Signed)
Pt advised.

## 2011-07-16 ENCOUNTER — Ambulatory Visit: Payer: Medicare Other

## 2011-08-05 ENCOUNTER — Other Ambulatory Visit: Payer: Self-pay | Admitting: Internal Medicine

## 2011-08-05 ENCOUNTER — Emergency Department (HOSPITAL_BASED_OUTPATIENT_CLINIC_OR_DEPARTMENT_OTHER)
Admission: EM | Admit: 2011-08-05 | Discharge: 2011-08-05 | Disposition: A | Discharge status: Home / Self Care | Payer: Medicare Other | Attending: Emergency Medicine | Admitting: Emergency Medicine

## 2011-08-05 ENCOUNTER — Encounter (HOSPITAL_BASED_OUTPATIENT_CLINIC_OR_DEPARTMENT_OTHER): Payer: Self-pay | Admitting: Emergency Medicine

## 2011-08-05 DIAGNOSIS — I10 Essential (primary) hypertension: Secondary | ICD-10-CM | POA: Insufficient documentation

## 2011-08-05 DIAGNOSIS — Y92009 Unspecified place in unspecified non-institutional (private) residence as the place of occurrence of the external cause: Secondary | ICD-10-CM | POA: Insufficient documentation

## 2011-08-05 DIAGNOSIS — Z79899 Other long term (current) drug therapy: Secondary | ICD-10-CM | POA: Insufficient documentation

## 2011-08-05 DIAGNOSIS — F329 Major depressive disorder, single episode, unspecified: Secondary | ICD-10-CM | POA: Insufficient documentation

## 2011-08-05 DIAGNOSIS — W57XXXA Bitten or stung by nonvenomous insect and other nonvenomous arthropods, initial encounter: Secondary | ICD-10-CM | POA: Insufficient documentation

## 2011-08-05 DIAGNOSIS — S90569A Insect bite (nonvenomous), unspecified ankle, initial encounter: Secondary | ICD-10-CM | POA: Insufficient documentation

## 2011-08-05 DIAGNOSIS — F3289 Other specified depressive episodes: Secondary | ICD-10-CM | POA: Insufficient documentation

## 2011-08-05 DIAGNOSIS — Z8679 Personal history of other diseases of the circulatory system: Secondary | ICD-10-CM | POA: Insufficient documentation

## 2011-08-05 DIAGNOSIS — E039 Hypothyroidism, unspecified: Secondary | ICD-10-CM | POA: Insufficient documentation

## 2011-08-05 MED ORDER — LIDOCAINE-EPINEPHRINE 2 %-1:100000 IJ SOLN
INTRAMUSCULAR | Status: AC
  Administered 2011-08-05: 1 mL
  Filled 2011-08-05: qty 1

## 2011-08-05 MED ORDER — TETANUS-DIPHTH-ACELL PERTUSSIS 5-2.5-18.5 LF-MCG/0.5 IM SUSP
INTRAMUSCULAR | Status: AC
  Administered 2011-08-05: 0.5 mL via INTRAMUSCULAR
  Filled 2011-08-05: qty 0.5

## 2011-08-05 NOTE — ED Notes (Signed)
Care plan and follow up reviewed 

## 2011-08-05 NOTE — ED Provider Notes (Signed)
History     CSN: [id number] Arrival date & time: 08/05/2011  9:34 AM   First MD Initiated Contact with Patient 08/05/11 0945      Chief Complaint  Patient presents with  . Tick Removal    R thigh 1 inch site red raised with dark center    (Consider location/radiation/quality/duration/timing/severity/associated sxs/prior treatment) HPI Comments: Patient has been doing increased work of the gardening in the woods and has had significant tick exposures.  She's had multiple tick bites in the past.  She comes in today with a tick that is still attached to her right upper thigh that she's been unable to remove.  She denies any other injuries.  No fevers.  It has been there since last night.  She does have pain at the site locally.  Patient is a 77 y.o. female presenting with animal bite. The history is provided by the patient. No language interpreter was used.  Animal Bite  The incident occurred yesterday. Pertinent negatives include no chest pain, no abdominal pain, no nausea, no vomiting and no headaches.    Past Medical History  Diagnosis Date  . CVA   . ARTHRITIS   . URINARY INCONTINENCE   . DEPRESSION   . DDD (degenerative disc disease)     Cervical spine & knees  . GERD   . DYSLIPIDEMIA   . HYPERTENSION   . HYPOTHYROIDISM   . PEPTIC ULCER DISEASE 11/2009  . CARCINOMA, THYROID GLAND, PAPILLARY 09/2008    thyroidectomy  . Palpitations   . Dizziness   . Dyspnea     Past Surgical History  Procedure Date  . Cholecystectomy   . Abdominal hysterectomy   . Thyroidectomy 2/30/10    papillary, s/p total  . Breast biopsy   . Back surgery   . Tonsillectomy   . Appendectomy   . Incontinence surgery   . Wrist surgery     LEFT  . Cardiac catheterization 03/30/2005    NORMAL EF. NORMAL CORONARIES  . Cardiovascular stress test 01/27/2003    NO EVIDENCE OF ISCHEMIA. EF 74%  . US echocardiography 03/16/2009    EF 55-60%  . US echocardiography 01/26/2003    EF 60-65%    Family  History  Problem Relation Age of Onset  . Arthritis Mother   . Diabetes Mother   . Hyperlipidemia Mother   . Arthritis Father   . Diabetes Father   . Hyperlipidemia Father   . Breast cancer Maternal Grandmother     History  Substance Use Topics  . Smoking status: Never Smoker   . Smokeless tobacco: Not on file   Comment: widowed, lives with daughter who is an addict. Retired, enjoying gardening  . Alcohol Use: No    OB History    Grav Para Term Preterm Abortions TAB SAB Ect Mult Living                  Review of Systems  Constitutional: Negative.  Negative for fever and chills.  HENT: Negative.   Eyes: Negative.   Respiratory: Negative.  Negative for shortness of breath.   Cardiovascular: Negative.  Negative for chest pain.  Gastrointestinal: Negative.  Negative for nausea, vomiting, abdominal pain and diarrhea.  Genitourinary: Negative.   Musculoskeletal: Negative.   Skin: Positive for wound. Negative for color change and rash.  Neurological: Negative.  Negative for syncope and headaches.  Hematological: Negative.  Negative for adenopathy.  Psychiatric/Behavioral: Negative.  Negative for confusion.  All other systems reviewed and  are negative.    Allergies  Iodine  Home Medications   Current Outpatient Rx  Name Route Sig Dispense Refill  . ALPRAZOLAM 0.5 MG PO TABS Oral Take 0.5 mg by mouth at bedtime as needed.     Marland Kitchen AMITRIPTYLINE HCL 10 MG PO TABS Oral Take 20 mg by mouth. Takes PRN     . ASPIRIN 81 MG PO TABS Oral Take 81 mg by mouth daily.      Marland Kitchen CLOPIDOGREL BISULFATE 75 MG PO TABS  Take 1 by mouth daily    . CLOPIDOGREL BISULFATE 75 MG PO TABS  TAKE 1 TABLET EVERY DAY 30 tablet 5  . ESTRADIOL PO Oral Take by mouth daily.      Marland Kitchen FLUOCINONIDE-E 0.05 % EX CREA Topical Apply topically. Taking 2-3 Times a week     . INDOMETHACIN 25 MG PO CAPS Oral Take 25 mg by mouth daily.      Marland Kitchen LEVOTHYROXINE SODIUM 88 MCG PO TABS Oral Take 88 mcg by mouth daily.      Marland Kitchen  METHOCARBAMOL 500 MG PO TABS Oral Take 500 mg by mouth daily.     Marland Kitchen PANTOPRAZOLE SODIUM 40 MG PO TBEC Oral Take 40 mg by mouth 2 (two) times daily.      Marland Kitchen PRAVASTATIN SODIUM 40 MG PO TABS Oral Take 40 mg by mouth 2 (two) times daily.      Marland Kitchen PROMETHAZINE HCL 12.5 MG PO TABS Oral Take 12.5 mg by mouth every 8 (eight) hours as needed.      Marland Kitchen TRAMADOL-ACETAMINOPHEN 37.5-325 MG PO TABS Oral Take 1 tablet by mouth 2 (two) times daily as needed. For pain 40 tablet 2  . ZOLPIDEM TARTRATE 5 MG PO TABS Oral Take 1 tablet (5 mg total) by mouth at bedtime as needed for sleep. 30 tablet 6    BP 160/66  Pulse 58  Temp(Src) 97.5 F (36.4 C) (Oral)  Resp 18  SpO2 97%  Physical Exam  Constitutional: She is oriented to person, place, and time. She appears well-developed and well-nourished.  HENT:  Head: Normocephalic and atraumatic.  Eyes: Conjunctivae and EOM are normal. Pupils are equal, round, and reactive to light.  Neck: Normal range of motion. Neck supple.  Pulmonary/Chest: Effort normal.  Musculoskeletal: Normal range of motion.  Neurological: She is alert and oriented to person, place, and time.  Skin: Skin is warm and dry.       Patient's right upper lateral thigh there is a tick that is attached.  Minimal surrounding redness is noted.  Psychiatric: She has a normal mood and affect. Her behavior is normal. Judgment and thought content normal.    ED Course  Procedures (including critical care time)  Labs Reviewed - No data to display No results found.   No diagnosis found.  Tick was ultimately able to be removed.  Initial attempt was made with tweezers to gently grab the head and pulled unfortunately the tic did break off.  I had to make a small incision after injecting 2% lidocaine with epinephrine approximately 2 mL surrounding area.  Once the small incision was made I was able to remove the final remnants of the tic head that was attached with a combination of the scalpel and tweezers.     MDM  Patient with tick to right thigh which is removed here in the emergency department.  Patient given wound care instructions.  Given tetanus shot update as well.  She knows to followup with her  primary care doctor if the area appears to become red or infected.         Nat Christen, MD 08/05/11 1031

## 2011-08-05 NOTE — ED Notes (Signed)
abcess to R thigh with possible insect bite

## 2011-08-31 ENCOUNTER — Other Ambulatory Visit: Payer: Self-pay | Admitting: Internal Medicine

## 2011-09-03 ENCOUNTER — Other Ambulatory Visit: Payer: Self-pay | Admitting: Internal Medicine

## 2011-09-07 ENCOUNTER — Encounter (INDEPENDENT_AMBULATORY_CARE_PROVIDER_SITE_OTHER): Payer: Self-pay | Admitting: Surgery

## 2011-09-07 ENCOUNTER — Ambulatory Visit (INDEPENDENT_AMBULATORY_CARE_PROVIDER_SITE_OTHER): Payer: Medicare Other | Admitting: Surgery

## 2011-09-07 VITALS — BP 130/88 | HR 76 | Temp 98.5°F | Resp 16 | Ht 63.0 in | Wt 161.2 lb

## 2011-09-07 DIAGNOSIS — C73 Malignant neoplasm of thyroid gland: Secondary | ICD-10-CM

## 2011-09-07 NOTE — Progress Notes (Addendum)
[facility]  [name], MD,  FACS [address] [place]  [address] [place], [place]    [address] Phone:  [phone] FAX:  [phone]   Re:   [name] DOB:   [birth year] MRN:   [id number]  ASSESSMENT AND PLAN: 1. Follicular variant of papillary ca, T2 lesion.   Total thyroidectomy 10/01/2008..  Nuclear med I-131 whole body scan last done 04/28/2010 - neg.  Disease free at this time.   The patient had questions about another scan.  She is doing well.  I don't have any labs from Dr. Willeen Cass office and her office is closed right now.  I think this would be a low yield test.  The options are to do nothing, check a TG level on her (and let that drive decisions), or repeat the I-131 whole body scan.  I'll discuss this with Dr. Talmage Nap.  She is seeing Dr. Talmage Nap who is monitoring her synthroid level every 6 months. [I spoke to Dr. Talmage Nap.  She'll pull her chart, check her TG levels.  Scan probably no necessary of TG levels low.  DN  09/10/11]  Otherwise I will see her in 1 year.  2. Thyroid function tests followed by Dr. Assunta Found.   On stable synthroid dose.  3. 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  4. Lower extremity neuropathy, left > right.  But this is better.  She only saw Dr. Danielle Dess once.  HISTORY OF PRESENT ILLNESS: Chief Complaint  Patient presents with  . Follow-up    6 mo thy reck    [name] is a 78 y.o. (DOB: 09-04-1927)  white female who is a patient of Rene Paci, MD, MD and comes to me today for thyroid cancer follow up.  She had a total thyroidectomy by me on 10/01/2008 for a 2.3 cm follicular variant of a papillary carcinoma with negative margins. She had a thyrogen stimulated whole body scan on 04/29/2009 that was negative.   Dr. Willey Blade is her PCP. Dr. Assunta Found sees her for endocrine and she is on 88 mcg of Synthroid and her labs are being followed by Dr. Talmage Nap.   Her main complain is a pulling in her neck.  She still has  very limited neck movement.  Her daughter wants her to stop driving.  She has noticed no new mass or lump.  PHYSICAL EXAM: BP 130/88  Pulse 76  Temp(Src) 98.5 F (36.9 C) (Temporal)  Resp 16  Ht 5\' 3"  (1.6 m)  Wt 161 lb 3.2 oz (73.12 kg)  BMI 28.56 kg/m2  Neck: Her neck is stiff and she has trouble turning her head even a few degrees to the left or right. I feel no mass.  Lymph nodes: I feel no cervical or supraclavicular nodes.  Lungs: Clear to auscultation.  DATA REVIEWED: No new data.  [name], MD, FACS Office:  [phone]

## 2011-09-30 ENCOUNTER — Encounter (HOSPITAL_BASED_OUTPATIENT_CLINIC_OR_DEPARTMENT_OTHER): Payer: Self-pay | Admitting: *Deleted

## 2011-09-30 ENCOUNTER — Emergency Department (HOSPITAL_BASED_OUTPATIENT_CLINIC_OR_DEPARTMENT_OTHER)
Admission: EM | Admit: 2011-09-30 | Discharge: 2011-09-30 | Disposition: A | Discharge status: Home / Self Care | Payer: Medicare Other | Attending: Emergency Medicine | Admitting: Emergency Medicine

## 2011-09-30 DIAGNOSIS — F3289 Other specified depressive episodes: Secondary | ICD-10-CM | POA: Insufficient documentation

## 2011-09-30 DIAGNOSIS — F329 Major depressive disorder, single episode, unspecified: Secondary | ICD-10-CM | POA: Insufficient documentation

## 2011-09-30 DIAGNOSIS — E785 Hyperlipidemia, unspecified: Secondary | ICD-10-CM | POA: Insufficient documentation

## 2011-09-30 DIAGNOSIS — M810 Age-related osteoporosis without current pathological fracture: Secondary | ICD-10-CM | POA: Insufficient documentation

## 2011-09-30 DIAGNOSIS — K219 Gastro-esophageal reflux disease without esophagitis: Secondary | ICD-10-CM | POA: Insufficient documentation

## 2011-09-30 DIAGNOSIS — Z8679 Personal history of other diseases of the circulatory system: Secondary | ICD-10-CM | POA: Insufficient documentation

## 2011-09-30 DIAGNOSIS — I10 Essential (primary) hypertension: Secondary | ICD-10-CM | POA: Insufficient documentation

## 2011-09-30 DIAGNOSIS — Z79899 Other long term (current) drug therapy: Secondary | ICD-10-CM | POA: Insufficient documentation

## 2011-09-30 LAB — BASIC METABOLIC PANEL
BUN: 20 mg/dL (ref 6–23)
Calcium: 9.6 mg/dL (ref 8.4–10.5)
Creatinine, Ser: 1.1 mg/dL (ref 0.50–1.10)
GFR calc non Af Amer: 46 mL/min — ABNORMAL LOW (ref >90)
Glucose, Bld: 92 mg/dL (ref 70–99)

## 2011-09-30 LAB — CBC
Hemoglobin: 14 g/dL (ref 12.0–15.0)
MCH: 30.4 pg (ref 26.0–34.0)
MCHC: 34 g/dL (ref 30.0–36.0)
MCV: 89.6 fL (ref 78.0–100.0)
RBC: 4.6 MIL/uL (ref 3.87–5.11)

## 2011-09-30 LAB — DIFFERENTIAL
Eosinophils Absolute: 0.2 10*3/uL (ref 0.0–0.7)
Eosinophils Relative: 4 % (ref 0–5)
Lymphs Abs: 1.4 10*3/uL (ref 0.7–4.0)
Monocytes Absolute: 0.4 10*3/uL (ref 0.1–1.0)
Monocytes Relative: 7 % (ref 3–12)

## 2011-09-30 NOTE — ED Provider Notes (Signed)
History     CSN: [id number]  Arrival date & time 09/30/11  1354   First MD Initiated Contact with Patient 09/30/11 1415      Chief Complaint  Patient presents with  . Hypertension    (Consider location/radiation/quality/duration/timing/severity/associated sxs/prior treatment) HPI Patient has been having some recent stress at home associated with some family members. Patient states she was feeling a little bit anxious. Her daughters were checking in on her and routinely checked her blood pressure. They noted that it was elevated in the 180s systolic. They were concerned because she has history of stroke and brought her to the emergency room for evaluation. Patient denies any symptoms. She has noted some tingling around her mouth on occasion but not currently. She denies any headache, weakness, chest pain or shortness of breath. She has not had any vomiting or diarrhea. Past Medical History  Diagnosis Date  . CVA   . ARTHRITIS   . URINARY INCONTINENCE   . DEPRESSION   . DDD (degenerative disc disease)     Cervical spine & knees  . GERD   . DYSLIPIDEMIA   . HYPERTENSION   . HYPOTHYROIDISM   . PEPTIC ULCER DISEASE 11/2009  . CARCINOMA, THYROID GLAND, PAPILLARY 09/2008    thyroidectomy  . Palpitations   . Dizziness   . Dyspnea   . Osteoporosis   . Stroke     Past Surgical History  Procedure Date  . Cholecystectomy   . Abdominal hysterectomy   . Thyroidectomy 2/30/10    papillary, s/p total  . Breast biopsy   . Back surgery   . Tonsillectomy   . Appendectomy   . Incontinence surgery   . Wrist surgery     LEFT  . Cardiac catheterization 03/30/2005    NORMAL EF. NORMAL CORONARIES  . Cardiovascular stress test 01/27/2003    NO EVIDENCE OF ISCHEMIA. EF 74%  . US echocardiography 03/16/2009    EF 55-60%  . US echocardiography 01/26/2003    EF 60-65%    Family History  Problem Relation Age of Onset  . Arthritis Mother   . Diabetes Mother   . Hyperlipidemia Mother   .  Arthritis Father   . Diabetes Father   . Hyperlipidemia Father   . Breast cancer Maternal Grandmother   . Cancer Sister     breast    History  Substance Use Topics  . Smoking status: Never Smoker   . Smokeless tobacco: Not on file   Comment: widowed, lives with daughter who is an addict. Retired, enjoying gardening  . Alcohol Use: No    OB History    Grav Para Term Preterm Abortions TAB SAB Ect Mult Living                  Review of Systems  All other systems reviewed and are negative.    Allergies  Iodine  Home Medications   Current Outpatient Rx  Name Route Sig Dispense Refill  . ALPRAZOLAM 0.5 MG PO TABS Oral Take 0.5 mg by mouth at bedtime as needed.     Marland Kitchen AMITRIPTYLINE HCL 10 MG PO TABS  TAKE 1 TABLET BY MOUTH AT BEDTIME 30 tablet 5  . ASPIRIN 81 MG PO TABS Oral Take 81 mg by mouth daily.      Marland Kitchen CLOPIDOGREL BISULFATE 75 MG PO TABS  Take 1 by mouth daily    . CLOPIDOGREL BISULFATE 75 MG PO TABS  TAKE 1 TABLET EVERY DAY 30 tablet 5  .  ESTRADIOL PO Oral Take by mouth daily.      Marland Kitchen FLUOCINONIDE-E 0.05 % EX CREA Topical Apply topically. Taking 2-3 Times a week     . INDOMETHACIN 25 MG PO CAPS Oral Take 25 mg by mouth daily.      . INDOMETHACIN 25 MG PO CAPS  TAKE ONE CAPSULE BY MOUTH EVERY DAY AS NEEDED 60 capsule 1  . LEVOTHYROXINE SODIUM 88 MCG PO TABS Oral Take 88 mcg by mouth daily.      Marland Kitchen METHOCARBAMOL 500 MG PO TABS  TAKE 1-2 TABLETS BY MOUTH DAILY AS NEEDED 60 tablet 2  . PANTOPRAZOLE SODIUM 40 MG PO TBEC Oral Take 40 mg by mouth 2 (two) times daily.      Marland Kitchen PRAVASTATIN SODIUM 40 MG PO TABS Oral Take 40 mg by mouth 2 (two) times daily.      Marland Kitchen PROMETHAZINE HCL 12.5 MG PO TABS Oral Take 12.5 mg by mouth every 8 (eight) hours as needed.      Marland Kitchen TRAMADOL-ACETAMINOPHEN 37.5-325 MG PO TABS Oral Take 1 tablet by mouth 2 (two) times daily as needed. For pain 40 tablet 2  . ZOLPIDEM TARTRATE 5 MG PO TABS Oral Take 1 tablet (5 mg total) by mouth at bedtime as needed for  sleep. 30 tablet 6    BP 130/83  Pulse 68  Temp(Src) 97.9 F (36.6 C) (Oral)  Resp 16  Ht 5\' 3"  (1.6 m)  Wt 161 lb (73.029 kg)  BMI 28.52 kg/m2  SpO2 100%  Physical Exam  Nursing note and vitals reviewed. Constitutional: She appears well-developed and well-nourished. No distress.  HENT:  Head: Normocephalic and atraumatic.  Right Ear: External ear normal.  Left Ear: External ear normal.  Eyes: Conjunctivae are normal. Right eye exhibits no discharge. Left eye exhibits no discharge. No scleral icterus.  Neck: Neck supple. No tracheal deviation present.  Cardiovascular: Normal rate, regular rhythm and intact distal pulses.   Pulmonary/Chest: Effort normal and breath sounds normal. No stridor. No respiratory distress. She has no wheezes. She has no rales.  Abdominal: Soft. Bowel sounds are normal. She exhibits no distension. There is no tenderness. There is no rebound and no guarding.  Musculoskeletal: She exhibits no edema and no tenderness.  Neurological: She is alert. She has normal strength. No sensory deficit. Cranial nerve deficit:  no gross defecits noted. She exhibits normal muscle tone. She displays no seizure activity. Coordination normal.  Skin: Skin is warm and dry. No rash noted.  Psychiatric: She has a normal mood and affect.    ED Course  Procedures (including critical care time)  Labs Reviewed  BASIC METABOLIC PANEL - Abnormal; Notable for the following:    GFR calc non Af Amer 46 (*)    GFR calc Af Amer 53 (*)    All other components within normal limits  CBC  DIFFERENTIAL   No results found.   1. Hypertension       MDM  Patient with asymptomatic hypertension. It was transient however and her blood pressure here is been essentially normal. She's not having any chest pain and there are no symptoms to suggest TIA stroke. Patient will be discharged home and I recommended she continue to monitor blood pressure, keep a log book and follow up with her  primary Dr..  these findings were discussed with the patient and her family       Celene Kras, MD 09/30/11 [phone]

## 2011-09-30 NOTE — ED Notes (Signed)
Patient states that her daughters were concerned because her BP was elevated at lunch time today around 191, no s/s, concerned about hx of strokes

## 2011-09-30 NOTE — ED Notes (Signed)
Patient states no pain or changes from baseline, concerned about elevated BP today

## 2011-10-11 ENCOUNTER — Other Ambulatory Visit: Payer: Self-pay | Admitting: Internal Medicine

## 2011-10-22 ENCOUNTER — Ambulatory Visit (INDEPENDENT_AMBULATORY_CARE_PROVIDER_SITE_OTHER): Payer: Medicare Other | Admitting: Internal Medicine

## 2011-10-22 ENCOUNTER — Encounter: Payer: Self-pay | Admitting: Internal Medicine

## 2011-10-22 VITALS — BP 158/78 | HR 70 | Temp 97.3°F | Wt 164.8 lb

## 2011-10-22 DIAGNOSIS — E039 Hypothyroidism, unspecified: Secondary | ICD-10-CM

## 2011-10-22 DIAGNOSIS — I10 Essential (primary) hypertension: Secondary | ICD-10-CM

## 2011-10-22 MED ORDER — ALPRAZOLAM 0.5 MG PO TABS: 0.5000 mg | ORAL_TABLET | Freq: Three times a day (TID) | ORAL | Status: DC | PRN

## 2011-10-22 MED ORDER — PRAVASTATIN SODIUM 40 MG PO TABS: 40.0000 mg | ORAL_TABLET | Freq: Every day | ORAL | Status: DC

## 2011-10-22 MED ORDER — AMITRIPTYLINE HCL 25 MG PO TABS: 25.0000 mg | ORAL_TABLET | Freq: Every day | ORAL | Status: DC

## 2011-10-22 MED ORDER — LOSARTAN POTASSIUM 50 MG PO TABS: 50.0000 mg | ORAL_TABLET | Freq: Every day | ORAL | Status: DC

## 2011-10-22 NOTE — Assessment & Plan Note (Signed)
ER eval 09/30/11 reviewed - labile BP readings Will start ARB now - recheck RN visit in 2 weeks, call sooner if problems

## 2011-10-22 NOTE — Progress Notes (Signed)
Subjective:    Patient ID: [name], female    DOB: [birth year], 78 y.o.   MRN: [id number]  HPI here for follow up - reviewed chronic medical issues:  Depression: Manifest with tearfulness and "anxiety"/nervousnes; head in "fog" and forgetful -  associated with excess physical fatigue and irregular sleep - started wellbutrin trial 08/2010 at dtr's urging - caused nausea so indep stopped med after 4 days -  persisting symptoms so began low dose amitrip - takes "as i need to for my mood and to sleep" the patient reports compliance with medication(s) as prescribed. Denies adverse side effects.  dyslipidemia - reports compliance with ongoing medical treatment and no changes in medication dose or frequency. denies adverse side effects related to current therapy. no GI or muscle aches  chronic neck pain - prior ortho and nsurg for same - told the pain is due to arthritis and bone spurs no radiation into arms but feels "tight" - pain causes difficulty with ADLs and sleep position in bed no balance problems or falls; pain partially relieved by Indocin buit "never goes away and always comes back" -now uses tramadol combo as needed - prior steroid shot with nsurg with temp relief  Hx papillary thyroid ca s/p total thyroidectomy 09/2008 for same - subsquent post surgical hypothyroidism -  follows with endo and surg for same - reviewed recent dose changes in thyroid meds no hoarsness or dysphagia, no weight loss -   GERD with gastritis - no changes in meds - no current reflux symptoms or abd pain.  prior GI eval revealed gastrtitis (EGD 12/05/09); prior 24h hosp 10/2009 eval related to same - no recurrent chest pain, on twice daily PPI - cont nausea and constipation  Past Medical History  Diagnosis Date  . CVA   . ARTHRITIS   . URINARY INCONTINENCE   . DEPRESSION   . DDD (degenerative disc disease)     Cervical spine & knees  . GERD   . DYSLIPIDEMIA   . HYPERTENSION   . HYPOTHYROIDISM     . PEPTIC ULCER DISEASE 11/2009  . CARCINOMA, THYROID GLAND, PAPILLARY 09/2008    s/p total thyroidectomy  . Osteoporosis      Review of Systems  Constitutional: Positive for fatigue. Negative for fever.  Respiratory: Negative for shortness of breath.   Cardiovascular: Negative for chest pain.  Musculoskeletal: Positive for arthralgias. Negative for back pain.       Objective:   Physical Exam  BP 158/78  Pulse 70  Temp(Src) 97.3 F (36.3 C) (Oral)  Wt 164 lb 12.8 oz (74.753 kg)  SpO2 98% Constitutional: She appears well-developed and well-nourished. No distress.  Neck: Normal range of motion. Neck supple. No JVD present. No thyromegaly present.  Cardiovascular: Normal rate, regular rhythm and normal heart sounds.  No murmur heard. no BLE edema Pulmonary/Chest: Effort normal and breath sounds normal. No respiratory distress. She has no wheezes.  Mskel - neck with FROM, Back: full range of motion of thoracic and lumbar spine. Non tender to palpation. Negative straight leg raise. DTR's are symmetrically intact. Sensation intact -Full strength to manual muscle testing; ambulates with normal gait. Psychiatric: She has a normal mood and affect. Her behavior is normal. Judgment and thought content normal.   Lab Results  Component Value Date   WBC 5.1 09/30/2011   HGB 14.0 09/30/2011   HCT 41.2 09/30/2011   PLT 187 09/30/2011   CHOL 196 04/11/2011   TRIG 141.0 04/11/2011   HDL  57.90 04/11/2011   ALT 18 04/11/2011   AST 20 04/11/2011   NA 138 09/30/2011   K 4.5 09/30/2011   CL 104 09/30/2011   CREATININE 1.10 09/30/2011   BUN 20 09/30/2011   CO2 26 09/30/2011   TSH 1.26 03/17/2010   INR 1.0 06/30/2007   HGBA1C  Value: 5.7 (NOTE) The ADA recommends the following therapeutic goal for glycemic control related to Hgb A1c measurement: Goal of therapy: <6.5 Hgb A1c  Reference: American Diabetes Association: Clinical Practice Recommendations 2010, Diabetes Care, 2010, 33: (Suppl  1). 11/07/2009         Assessment & Plan:  See problem list. Medications and labs reviewed today.

## 2011-10-22 NOTE — Patient Instructions (Signed)
It was good to see you today. We have reviewed your ER records including labs and tests today Medications reviewed, start losratan once daily for blood pressure - other updated changes as reviewed today Your prescription(s) have been submitted to your pharmacy. Please take as directed and contact our office if you believe you are having problem(s) with the medication(s). Also refill on medication(s) as discussed today. Return in 2 weeks for nurse visit to check blood pressure - goal SBP<135-140 Please schedule followup in 6 months for medication review, call sooner if problems.

## 2011-10-22 NOTE — Assessment & Plan Note (Signed)
Postsurgical 09/2008 for thyroid cancer tx Follows with endo (balan) for same - reports recent labs "normal" The current medical regimen is effective;  continue present plan and medications.  

## 2011-11-05 ENCOUNTER — Ambulatory Visit (INDEPENDENT_AMBULATORY_CARE_PROVIDER_SITE_OTHER): Payer: Medicare Other | Admitting: Internal Medicine

## 2011-11-05 ENCOUNTER — Ambulatory Visit: Payer: Medicare Other | Admitting: Internal Medicine

## 2011-11-05 ENCOUNTER — Encounter: Payer: Self-pay | Admitting: Internal Medicine

## 2011-11-05 VITALS — BP 152/72 | HR 71 | Temp 98.1°F | Ht 63.5 in | Wt 167.5 lb

## 2011-11-05 DIAGNOSIS — R1310 Dysphagia, unspecified: Secondary | ICD-10-CM

## 2011-11-05 DIAGNOSIS — R42 Dizziness and giddiness: Secondary | ICD-10-CM | POA: Insufficient documentation

## 2011-11-05 DIAGNOSIS — R51 Headache: Secondary | ICD-10-CM

## 2011-11-05 DIAGNOSIS — I10 Essential (primary) hypertension: Secondary | ICD-10-CM

## 2011-11-05 NOTE — Assessment & Plan Note (Addendum)
Recurrent symptoms,with hx of esoph dilation - ok to cont PPI bid, refer back to Dr Shela Commons Edwards/GI

## 2011-11-05 NOTE — Assessment & Plan Note (Signed)
Ok to stay off the losartan though not clear lower BP overall is related to pt symptoms; to cont to monitor BP at home and next visit BP Readings from Last 3 Encounters:  11/05/11 152/72  10/22/11 158/78  09/30/11 176/72

## 2011-11-05 NOTE — Assessment & Plan Note (Signed)
With fleeting sharp pains variable locations seemingly random with no change in neuro exam;  Doubt clinically signficant and not representative of TIA which is what pt and daughter most concerned about today, ok to follow

## 2011-11-05 NOTE — Assessment & Plan Note (Addendum)
Orthostatic type, near syncope recent, worse to sit up or stand up quickly, cbc/bmet normal feb 2012, ECG reviewed as per emr - no acute, last seen per Card oct 2012/Dr [name], no overt orthostasis on BP's today and no other acute illness; for now ok to stay off the losartan, but check echo/carotids and f/u Dr Felicity Coyer,  Advised to sit on bed 1 min in AM on first standing, and get up more slowly during the day  Note: time for pt hx, exam, review of record with pt in room, determination of diagnoses with plan for further eval and tx is > 40 min

## 2011-11-05 NOTE — Progress Notes (Signed)
Subjective:    Patient ID: [name], female    DOB: April 20, 1928, 78 y.o.   MRN: [id number]  HPI  Pt of Dr Felicity Coyer here as walkin with recurrent feeling of weakness and dizziness, particularly symptomatic with getting up in the AM, as well as bending forward and standing straight up rather quickly;  Had a rather significant episodes of tremulousness this am with getting up as well in the am, also mentions fleeing sharp pains to both right and left head for over a wk, and daughter and pt very concerned about possibility of TIA, though Pt denies new neurological symptoms such as  facial or extremity weakness or numbness.  BP has been elevated but with recurrent episodes of weak/dizzy "like a dishrag" she stopped the losartan; BP has remained elevated at home after this, no low BP documented.  Pt and daughter adamant they think it might be causing symptoms and will not re-start.  Also incidentally also with mild dysphagia worsening over the past few months without vomiting or aspiration.  Has recurrent CP such as d/w Dr Elease Hashimoto oct 2012 who noted normal card cath 2006.  Pt very nervous appearing today Past Medical History  Diagnosis Date  . CVA 1998    "mini" cva, no residual deficts  . ARTHRITIS   . URINARY INCONTINENCE   . DEPRESSION   . DDD (degenerative disc disease)     Cervical spine & knees  . GERD   . DYSLIPIDEMIA   . HYPERTENSION   . HYPOTHYROIDISM   . PEPTIC ULCER DISEASE 11/2009  . CARCINOMA, THYROID GLAND, PAPILLARY 09/2008    s/p total thyroidectomy  . Osteoporosis    Past Surgical History  Procedure Date  . Cholecystectomy   . Abdominal hysterectomy   . Thyroidectomy 2/30/10    papillary, s/p total  . Breast biopsy   . Back surgery   . Tonsillectomy   . Appendectomy   . Incontinence surgery   . Wrist surgery     LEFT  . Cardiac catheterization 03/30/2005    NORMAL EF. NORMAL CORONARIES  . Cardiovascular stress test 01/27/2003    NO EVIDENCE OF ISCHEMIA. EF 74%  .  US echocardiography 03/16/2009    EF 55-60%  . US echocardiography 01/26/2003    EF 60-65%    reports that she has never smoked. She does not have any smokeless tobacco history on file. She reports that she does not drink alcohol or use illicit drugs. family history includes Arthritis in her father and mother; Breast cancer in her maternal grandmother; Cancer in her sister; Diabetes in her father and mother; and Hyperlipidemia in her father and mother. Allergies  Allergen Reactions  . Iodine     REACTION: Nausea   Current Outpatient Prescriptions on File Prior to Visit  Medication Sig Dispense Refill  . ALPRAZolam (XANAX) 0.5 MG tablet Take 1 tablet (0.5 mg total) by mouth 3 (three) times daily as needed for sleep or anxiety.  90 tablet  1  . amitriptyline (ELAVIL) 25 MG tablet Take 1 tablet (25 mg total) by mouth at bedtime.  30 tablet  6  . aspirin 81 MG tablet Take 81 mg by mouth daily.        . clopidogrel (PLAVIX) 75 MG tablet Take 1 by mouth daily      . fluocinonide-emollient (LIDEX-E) 0.05 % cream Apply 1 application topically 2 (two) times a week.       . indomethacin (INDOCIN) 25 MG capsule TAKE ONE CAPSULE  BY MOUTH EVERY DAY AS NEEDED  60 capsule  1  . levothyroxine (SYNTHROID, LEVOTHROID) 100 MCG tablet Take 1 tablet (100 mcg total) by mouth daily.      . methocarbamol (ROBAXIN) 500 MG tablet Take 500 mg by mouth daily as needed. For pain      . pantoprazole (PROTONIX) 40 MG tablet Take 40 mg by mouth 2 (two) times daily.        . pravastatin (PRAVACHOL) 40 MG tablet Take 1 tablet (40 mg total) by mouth daily.  30 tablet  6  . promethazine (PHENERGAN) 12.5 MG tablet Take 12.5 mg by mouth every 8 (eight) hours as needed. For nausea      . traMADol-acetaminophen (ULTRACET) 37.5-325 MG per tablet Take 1 tablet by mouth 2 (two) times daily as needed. For pain      . losartan (COZAAR) 50 MG tablet Take 1 tablet (50 mg total) by mouth daily.  30 tablet  6  . DISCONTD:  fluocinonide-emollient (LIDEX-E) 0.05 % cream Apply topically 2 (two) times daily.  30 g  0   Review of Systems Review of Systems  Constitutional: Negative for diaphoresis and unexpected weight change.  HENT: Negative for drooling and tinnitus.   Eyes: Negative for photophobia and visual disturbance.  Respiratory: Negative for choking and stridor.   Gastrointestinal: Negative for vomiting and blood in stool.  Genitourinary: Negative for hematuria and decreased urine volume.    Objective:   Physical Exam BP 152/72  Pulse Kirsten  Temp(Src) 98.1 F (36.7 C) (Oral)  Ht 5' 3.5" (1.613 m)  Wt 167 lb 8 oz (75.978 kg)  BMI 29.21 kg/m2  SpO2 95% Physical Exam  VS noted Constitutional: Pt appears well-developed and well-nourished.  HENT: Head: Normocephalic.  Right Ear: External ear normal.  Left Ear: External ear normal.  Eyes: Conjunctivae and EOM are normal. Pupils are equal, round, and reactive to light.  Neck: Normal range of motion. Neck supple.  Cardiovascular: Normal rate and regular rhythm.   Pulmonary/Chest: Effort normal and breath sounds normal.  Abd:  Soft, NT, non-distended, + BS Neurological: Pt is alert. No cranial nerve deficit. motor/dtr/gait intact Skin: Skin is warm. No erythema.  Psychiatric: Pt behavior is normal. Thought content normal. 1-2+ nervous    Assessment & Plan:

## 2011-11-05 NOTE — Patient Instructions (Signed)
OK to stay off the losartan for now Continue all other medications as before Please get up slowly first thing in the morning, or any time of the day you have been sitting You will be contacted regarding the referral for: Echocardiogram, carotid ultrasound, and Dr Randa Evens

## 2011-11-06 ENCOUNTER — Other Ambulatory Visit: Payer: Self-pay | Admitting: Internal Medicine

## 2011-11-06 DIAGNOSIS — R42 Dizziness and giddiness: Secondary | ICD-10-CM

## 2011-11-14 ENCOUNTER — Other Ambulatory Visit: Payer: Self-pay | Admitting: Cardiology

## 2011-11-14 DIAGNOSIS — R42 Dizziness and giddiness: Secondary | ICD-10-CM

## 2011-11-16 ENCOUNTER — Encounter (INDEPENDENT_AMBULATORY_CARE_PROVIDER_SITE_OTHER): Payer: Medicare Other

## 2011-11-16 DIAGNOSIS — R42 Dizziness and giddiness: Secondary | ICD-10-CM

## 2011-11-16 DIAGNOSIS — I6529 Occlusion and stenosis of unspecified carotid artery: Secondary | ICD-10-CM

## 2011-11-21 ENCOUNTER — Encounter: Payer: Self-pay | Admitting: Internal Medicine

## 2011-11-26 ENCOUNTER — Other Ambulatory Visit: Payer: Self-pay

## 2011-11-26 ENCOUNTER — Ambulatory Visit: Payer: Medicare Other | Admitting: Internal Medicine

## 2011-11-26 ENCOUNTER — Ambulatory Visit (HOSPITAL_COMMUNITY): Payer: Medicare Other | Attending: Cardiology

## 2011-11-26 ENCOUNTER — Ambulatory Visit (INDEPENDENT_AMBULATORY_CARE_PROVIDER_SITE_OTHER): Payer: Medicare Other | Admitting: Internal Medicine

## 2011-11-26 ENCOUNTER — Encounter: Payer: Self-pay | Admitting: Internal Medicine

## 2011-11-26 VITALS — BP 132/74 | HR 77 | Temp 97.8°F | Resp 16 | Wt 166.0 lb

## 2011-11-26 DIAGNOSIS — E785 Hyperlipidemia, unspecified: Secondary | ICD-10-CM | POA: Insufficient documentation

## 2011-11-26 DIAGNOSIS — I519 Heart disease, unspecified: Secondary | ICD-10-CM

## 2011-11-26 DIAGNOSIS — I079 Rheumatic tricuspid valve disease, unspecified: Secondary | ICD-10-CM | POA: Insufficient documentation

## 2011-11-26 DIAGNOSIS — F329 Major depressive disorder, single episode, unspecified: Secondary | ICD-10-CM

## 2011-11-26 DIAGNOSIS — I10 Essential (primary) hypertension: Secondary | ICD-10-CM

## 2011-11-26 DIAGNOSIS — R42 Dizziness and giddiness: Secondary | ICD-10-CM | POA: Insufficient documentation

## 2011-11-26 DIAGNOSIS — E039 Hypothyroidism, unspecified: Secondary | ICD-10-CM

## 2011-11-26 MED ORDER — PROMETHAZINE HCL 12.5 MG PO TABS: 12.5000 mg | ORAL_TABLET | Freq: Three times a day (TID) | ORAL | Status: DC | PRN

## 2011-11-26 MED ORDER — PAROXETINE HCL 10 MG PO TABS: 10.0000 mg | ORAL_TABLET | ORAL | Status: DC

## 2011-11-26 NOTE — Patient Instructions (Signed)
It was good to see you today. We have reviewed results of your carotid and echocardiogram test: Everything looks good Continue half tablet at the start daily for blood pressure Start low-dose Paxil every day for symptoms as discussed. Other medications reviewed and no other changes Your prescription(s) have been submitted to your pharmacy. Please take as directed and contact our office if you believe you are having problem(s) with the medication(s). Please schedule followup in 4-6 weeks for recheck on anxiety/dperession, call sooner if problems.

## 2011-11-26 NOTE — Assessment & Plan Note (Signed)
Postsurgical 09/2008 for thyroid cancer tx Follows with endo (balan) for same - reports recent labs "normal" The current medical regimen is effective;  continue present plan and medications.  

## 2011-11-26 NOTE — Progress Notes (Signed)
Subjective:    Patient ID: [name], female    DOB: 08/19/1928, 78 y.o.   MRN: [id number]  HPI here for follow up - reviewed chronic medical issues:  HTN - started ARB for same 09/2011 - taking only 1/2 tab as full tab causing AM orthostatic symptoms -   Depression: Manifest with tearfulness and "anxiety"/nervousness; associated with excess physical fatigue and irregular sleep - started wellbutrin trial 08/2010 at dtr's urging - caused nausea so indep stopped med after 4 days -then began low dose amitrip summer 2012 - takes only "as i need to for my mood and sleep" - still with ongoing nervousness but reluctant to take medications that make her feel sleepy - willing to try alternate SSRI at this time.  dyslipidemia - reports compliance with ongoing medical treatment and no changes in medication dose or frequency. denies adverse side effects related to current therapy.   chronic neck pain - prior ortho and nsurg evaluation for same - told the pain is due to arthritis and bone spurs. no radiation into arms but feels "tight" - pain causes difficulty with ADLs and sleep position in bed no balance problems or falls; pain partially relieved by Indocin buit "never goes away and always comes back" - prior steroid shot with nsurg with temp relief  Hx papillary thyroid ca s/p total thyroidectomy 09/2008 for same - subsquent post surgical hypothyroidism -  follows with endo and surg for same - reviewed recent dose changes in thyroid meds no hoarsness or dysphagia, no weight loss -   GERD with gastritis - no changes in meds - no current reflux symptoms or abd pain.  prior GI eval revealed gastrtitis (EGD 12/05/09); prior 24h hosp 10/2009 eval related to same - no recurrent chest pain, on twice daily PPI - cont nausea and constipation  Past Medical History  Diagnosis Date  . CVA 1998    "mini" cva, no residual deficts  . ARTHRITIS   . URINARY INCONTINENCE   . DEPRESSION   . DDD (degenerative  disc disease)     Cervical spine & knees  . GERD   . DYSLIPIDEMIA   . HYPERTENSION   . HYPOTHYROIDISM   . PEPTIC ULCER DISEASE 11/2009  . CARCINOMA, THYROID GLAND, PAPILLARY 09/2008    s/p total thyroidectomy  . Osteoporosis      Review of Systems  Constitutional: Positive for fatigue. Negative for fever and unexpected weight change.  Respiratory: Negative for shortness of breath.   Cardiovascular: Negative for chest pain.  Genitourinary: Negative for hematuria, flank pain and dyspareunia.  Musculoskeletal: Positive for arthralgias. Negative for back pain.  Neurological: Positive for light-headedness and headaches. Negative for tremors, seizures, syncope, speech difficulty, weakness and numbness.       Objective:   Physical Exam  BP 132/74  Pulse 77  Temp(Src) 97.8 F (36.6 C) (Oral)  Resp 16  Wt 166 lb (75.297 kg)  SpO2 96% Constitutional: She appears well-developed and well-nourished. No distress.  daughter at side Neck: Normal range of motion. Neck supple. No JVD present. No thyromegaly present.  Cardiovascular: Normal rate, regular rhythm and normal heart sounds.  No murmur heard. no BLE edema Pulmonary/Chest: Effort normal and breath sounds normal. No respiratory distress. She has no wheezes.  Neurologic: Awake, alert, oriented x4. Cranial nerves II through XII symmetrically intact. No dysarthria or aphasia. Balance and coordination are normal. Psychiatric: She has an anxious mood and affect, occasionally tearful. Her behavior is normal. Judgment and thought content normal.  Lab Results  Component Value Date   WBC 5.1 09/30/2011   HGB 14.0 09/30/2011   HCT 41.2 09/30/2011   PLT 187 09/30/2011   CHOL 196 04/11/2011   TRIG 141.0 04/11/2011   HDL 57.90 04/11/2011   ALT 18 04/11/2011   AST 20 04/11/2011   NA 138 09/30/2011   K 4.5 09/30/2011   CL 104 09/30/2011   CREATININE 1.10 09/30/2011   BUN 20 09/30/2011   CO2 26 09/30/2011   TSH 1.26 03/17/2010   INR 1.0 06/30/2007   HGBA1C   Value: 5.7 (NOTE) The ADA recommends the following therapeutic goal for glycemic control related to Hgb A1c measurement: Goal of therapy: <6.5 Hgb A1c  Reference: American Diabetes Association: Clinical Practice Recommendations 2010, Diabetes Care, 2010, 33: (Suppl  1). 11/07/2009        Assessment & Plan:  See problem list. Medications and labs reviewed today.

## 2011-11-26 NOTE — Assessment & Plan Note (Signed)
exacerbated by home situation (disabled dtr with mood instability at home - PTSD) Complicated by insomnia - Prev tx with wellbutrin not tolerated so changed to amitriptyline - sleep better but considerable anxiety persist discussed SSRI - add low dose same now - f/u 4-6 weeks, no other med changes

## 2011-11-26 NOTE — Assessment & Plan Note (Signed)
ER eval 09/30/11 reviewed - labile BP readings started ARB 09/2101 - taking 1/2 tab due to dizziness symptoms reviewed Echo and carotids 10/2011 - ok

## 2011-11-27 ENCOUNTER — Telehealth: Payer: Self-pay | Admitting: *Deleted

## 2011-11-27 ENCOUNTER — Encounter: Payer: Self-pay | Admitting: Internal Medicine

## 2011-11-27 NOTE — Telephone Encounter (Signed)
Notified pt with md response.. 11/27/11@3 :27pm/LMB

## 2011-11-27 NOTE — Telephone Encounter (Signed)
Left msg on vm going put of town and needing results back from echo test that was done yesterday... 11/27/11@12 :28pm/LMB

## 2011-11-27 NOTE — Telephone Encounter (Signed)
Normal - letter from Dr Jonny Ruiz stating same on route to pt home

## 2011-12-15 ENCOUNTER — Other Ambulatory Visit: Payer: Self-pay | Admitting: Internal Medicine

## 2011-12-17 ENCOUNTER — Other Ambulatory Visit: Payer: Self-pay | Admitting: Internal Medicine

## 2011-12-24 ENCOUNTER — Encounter: Payer: Self-pay | Admitting: Internal Medicine

## 2011-12-24 ENCOUNTER — Ambulatory Visit (INDEPENDENT_AMBULATORY_CARE_PROVIDER_SITE_OTHER): Payer: Medicare Other | Admitting: Internal Medicine

## 2011-12-24 VITALS — BP 130/72 | HR 76 | Temp 98.2°F | Ht 63.0 in | Wt 169.2 lb

## 2011-12-24 DIAGNOSIS — N898 Other specified noninflammatory disorders of vagina: Secondary | ICD-10-CM

## 2011-12-24 DIAGNOSIS — L293 Anogenital pruritus, unspecified: Secondary | ICD-10-CM

## 2011-12-24 DIAGNOSIS — I10 Essential (primary) hypertension: Secondary | ICD-10-CM

## 2011-12-24 DIAGNOSIS — F329 Major depressive disorder, single episode, unspecified: Secondary | ICD-10-CM

## 2011-12-24 NOTE — Progress Notes (Signed)
  Subjective:    Patient ID: [name], female    DOB: Mar 18, 1928, 78 y.o.   MRN: [id number]  HPI here for follow up:  HTN - started ARB for same 09/2011 - taking only 1/2 tab as full tab causing AM orthostatic symptoms -   Depression/anxiety: Manifest with tearfulness and "anxiety"/nervousness; associated with excess physical fatigue and irregular sleep - started wellbutrin trial 08/2010 at dtr's urging - caused nausea so stopped med after 4 days -then began low dose amitrip summer 2012 - takes only "as i need to for my mood and sleep" - still with ongoing nervousness but reluctant to take medications that make her feel sleepy - prescribed paxil 4/13 but not taking regularly  Past Medical History  Diagnosis Date  . CVA 1998    "mini" cva, no residual deficts  . ARTHRITIS   . URINARY INCONTINENCE   . DEPRESSION   . DDD (degenerative disc disease)     Cervical spine & knees  . GERD   . DYSLIPIDEMIA   . HYPERTENSION   . HYPOTHYROIDISM   . PEPTIC ULCER DISEASE 11/2009  . CARCINOMA, THYROID GLAND, PAPILLARY 09/2008    s/p total thyroidectomy  . Osteoporosis      Review of Systems  Constitutional: Positive for fatigue. Negative for fever and unexpected weight change.  Respiratory: Negative for cough and shortness of breath.   Cardiovascular: Negative for chest pain and palpitations.  Genitourinary: Positive for vaginal pain (itch and irritation).       Objective:   Physical Exam  BP 130/72  Pulse 76  Temp(Src) 98.2 F (36.8 C) (Oral)  Ht 5\' 3"  (1.6 m)  Wt 169 lb 3.2 oz (76.749 kg)  BMI 29.97 kg/m2  SpO2 97% Wt Readings from Last 3 Encounters:  12/24/11 169 lb 3.2 oz (76.749 kg)  11/26/11 166 lb (75.297 kg)  11/05/11 167 lb 8 oz (75.978 kg)   Constitutional: She appears well-developed and well-nourished. No distress.  daughter at side Neck: Normal range of motion. Neck supple. No JVD present. No thyromegaly present.  Cardiovascular: Normal rate, regular rhythm  and normal heart sounds.  No murmur heard. no BLE edema Pulmonary/Chest: Effort normal and breath sounds normal. No respiratory distress. She has no wheezes.  GU: defer to gynecology Psychiatric: She has an anxious mood and affect, but not tearful today. Her behavior is normal. Judgment and thought content normal.   Lab Results  Component Value Date   WBC 5.1 09/30/2011   HGB 14.0 09/30/2011   HCT 41.2 09/30/2011   PLT 187 09/30/2011   CHOL 196 04/11/2011   TRIG 141.0 04/11/2011   HDL 57.90 04/11/2011   ALT 18 04/11/2011   AST 20 04/11/2011   NA 138 09/30/2011   K 4.5 09/30/2011   CL 104 09/30/2011   CREATININE 1.10 09/30/2011   BUN 20 09/30/2011   CO2 26 09/30/2011   TSH 1.26 03/17/2010   INR 1.0 06/30/2007   HGBA1C  Value: 5.7 11/07/2009        Assessment & Plan:  See problem list. Medications and labs reviewed today.  Vaginal irritation and itch - refer to gynecology as requested

## 2011-12-24 NOTE — Patient Instructions (Signed)
It was good to see you today. Continue half tablet at the start daily for blood pressure Please start low-dose Paxil every day for symptoms as discussed. Other medications reviewed and no other changes we'll make referral to gynecology . Our office will contact you regarding appointment(s) once made.  Please schedule followup in 6 weeks for recheck on anxiety/dperession, call sooner if problems.

## 2011-12-24 NOTE — Assessment & Plan Note (Signed)
ER eval 09/30/11 reviewed - labile BP readings started ARB 09/2101 - taking 1/2 tab due to dizziness symptoms reviewed Echo and carotids 10/2011 - ok BP Readings from Last 3 Encounters:  12/24/11 130/72  11/26/11 132/74  11/05/11 152/72

## 2011-12-24 NOTE — Assessment & Plan Note (Signed)
exacerbated by home situation (disabled dtr with mood instability at home - PTSD and widowed 2008) Complicated by insomnia - using elavil for same Prev tx with wellbutrin not tolerated  discussed SSRI again - rx'd low dose Paxil but not yet started Encouraged to start now and call if symptoms worse - f/u 4-6 weeks, no other med changes

## 2012-02-11 ENCOUNTER — Ambulatory Visit: Payer: Medicare Other | Admitting: Internal Medicine

## 2012-02-26 ENCOUNTER — Telehealth: Payer: Self-pay | Admitting: *Deleted

## 2012-02-26 NOTE — Telephone Encounter (Signed)
Need to be off plavix at least 24 hrs prior to procedure

## 2012-02-26 NOTE — Telephone Encounter (Signed)
[facility] office called-pt was scheduled for extraction today but had taken blood thinner (Plavix) they are asking for MD's advisement if pt can have extraction done today-Please advise in VAL's absence.

## 2012-02-27 NOTE — Telephone Encounter (Signed)
Dental office informed of MD's advisement. Note faxed to them with MD's instructions per request.

## 2012-03-22 ENCOUNTER — Other Ambulatory Visit: Payer: Self-pay | Admitting: Internal Medicine

## 2012-04-21 ENCOUNTER — Ambulatory Visit: Payer: Medicare Other | Admitting: Internal Medicine

## 2012-04-25 ENCOUNTER — Other Ambulatory Visit: Payer: Self-pay | Admitting: Internal Medicine

## 2012-05-25 ENCOUNTER — Other Ambulatory Visit: Payer: Self-pay | Admitting: Internal Medicine

## 2012-05-27 ENCOUNTER — Other Ambulatory Visit: Payer: Self-pay | Admitting: Internal Medicine

## 2012-05-28 NOTE — Telephone Encounter (Signed)
Last written 10/22/2011 #90 with 1 refill-please advise.

## 2012-05-29 NOTE — Telephone Encounter (Signed)
Rx faxed to [facility].  

## 2012-06-13 ENCOUNTER — Encounter: Payer: Self-pay | Admitting: Internal Medicine

## 2012-06-16 ENCOUNTER — Other Ambulatory Visit: Payer: Self-pay | Admitting: Internal Medicine

## 2012-06-23 ENCOUNTER — Other Ambulatory Visit: Payer: Self-pay | Admitting: Internal Medicine

## 2012-08-23 ENCOUNTER — Other Ambulatory Visit: Payer: Self-pay | Admitting: Internal Medicine

## 2012-08-25 NOTE — Telephone Encounter (Signed)
Last written 05/27/2012 #90 with 0 refills-please advise in VAL's absence.

## 2012-09-13 ENCOUNTER — Other Ambulatory Visit: Payer: Self-pay | Admitting: Internal Medicine

## 2012-09-22 ENCOUNTER — Ambulatory Visit (INDEPENDENT_AMBULATORY_CARE_PROVIDER_SITE_OTHER): Payer: Medicare Other | Admitting: Internal Medicine

## 2012-09-22 ENCOUNTER — Other Ambulatory Visit (INDEPENDENT_AMBULATORY_CARE_PROVIDER_SITE_OTHER): Payer: Medicare Other

## 2012-09-22 ENCOUNTER — Encounter: Payer: Self-pay | Admitting: Internal Medicine

## 2012-09-22 VITALS — BP 120/62 | HR 68 | Temp 97.5°F | Ht 63.0 in | Wt 160.8 lb

## 2012-09-22 DIAGNOSIS — F329 Major depressive disorder, single episode, unspecified: Secondary | ICD-10-CM

## 2012-09-22 DIAGNOSIS — M810 Age-related osteoporosis without current pathological fracture: Secondary | ICD-10-CM

## 2012-09-22 DIAGNOSIS — E785 Hyperlipidemia, unspecified: Secondary | ICD-10-CM

## 2012-09-22 DIAGNOSIS — Z0000 Encounter for general adult medical examination without abnormal findings: Secondary | ICD-10-CM

## 2012-09-22 DIAGNOSIS — I10 Essential (primary) hypertension: Secondary | ICD-10-CM

## 2012-09-22 DIAGNOSIS — E039 Hypothyroidism, unspecified: Secondary | ICD-10-CM

## 2012-09-22 DIAGNOSIS — C73 Malignant neoplasm of thyroid gland: Secondary | ICD-10-CM

## 2012-09-22 LAB — LIPID PANEL
HDL: 42.4 mg/dL (ref >39.00)
VLDL: 26.8 mg/dL (ref 0.0–40.0)

## 2012-09-22 NOTE — Assessment & Plan Note (Signed)
ER eval 09/30/11 reviewed - labile BP readings started ARB 09/2101 - taking 1/2-1 tab due to dizziness symptoms reviewed Echo and carotids 10/2011 - ok BP Readings from Last 3 Encounters:  09/22/12 120/62  12/24/11 130/72  11/26/11 132/74

## 2012-09-22 NOTE — Assessment & Plan Note (Signed)
On statin - denies symptoms - check lipids now

## 2012-09-22 NOTE — Progress Notes (Signed)
Subjective:    Patient ID: [name], female    DOB: August 25, 1928, 79 y.o.   MRN: [id number]  HPI  Here for medicare wellness  Diet: heart healthy Physical activity: sedentary Depression/mood screen: negative Hearing: intact to whispered voice Visual acuity: grossly normal, performs annual eye exam  ADLs: capable Fall risk: none Home safety: good Cognitive evaluation: intact to orientation, naming, recall and repetition EOL planning: adv directives, full code/ I agree  I have personally reviewed and have noted 1. The patient's medical and social history 2. Their use of alcohol, tobacco or illicit drugs 3. Their current medications and supplements 4. The patient's functional ability including ADL's, fall risks, home safety risks and hearing or visual impairment. 5. Diet and physical activities 6. Evidence for depression or mood disorders   Also reviewed chronic medical issues:  HTN - started ARB for same 09/2011 - taking only 1/2 tab as full tab causing AM orthostatic symptoms -   Depression/anxiety: Manifest with tearfulness and "anxiety"/nervousness; associated with excess physical fatigue and irregular sleep - started wellbutrin trial 08/2010 at dtr's urging - caused nausea so stopped med after 4 days -then began low dose amitrip summer 2012 - takes only "as i need to for my mood and sleep" - still with ongoing nervousness but reluctant to take medications that make her feel sleepy - prescribed paxil 4/13 but not taking regularly  Hypothyroid. Complicated with thyroid cancer 2010. Follows with endocrinology for same. the patient reports compliance with medication(s) as prescribed. Denies adverse side effects.   Hyperlipidemia. On pravastatin. the patient reports compliance with medication(s) as prescribed. Denies adverse side effects.  Past Medical History  Diagnosis Date  . CVA 1998    "mini" cva, no residual deficts  . ARTHRITIS   . URINARY INCONTINENCE   .  DEPRESSION   . DDD (degenerative disc disease)     Cervical spine & knees  . GERD   . DYSLIPIDEMIA   . HYPERTENSION   . HYPOTHYROIDISM   . PEPTIC ULCER DISEASE 11/2009  . CARCINOMA, THYROID GLAND, PAPILLARY 09/2008    s/p total thyroidectomy  . Osteoporosis    Family History  Problem Relation Age of Onset  . Arthritis Mother   . Diabetes Mother   . Hyperlipidemia Mother   . Arthritis Father   . Diabetes Father   . Hyperlipidemia Father   . Breast cancer Maternal Grandmother   . Cancer Sister     breast   History  Substance Use Topics  . Smoking status: Never Smoker   . Smokeless tobacco: Not on file     Comment: widowed, lives with daughter "who is an addict" per pt. Retired, enjoying gardening  . Alcohol Use: No    Review of Systems  Constitutional: Positive for fatigue. Negative for fever and unexpected weight change.  Respiratory: Negative for cough and shortness of breath.   Cardiovascular: Negative for chest pain and palpitations.  Gastrointestinal: Negative for abdominal pain, no bowel changes.  Musculoskeletal: Negative for gait problem or joint swelling.  Skin: Negative for rash.  Neurological: Negative for dizziness or headache.  No other specific complaints in a complete review of systems (except as listed in HPI above).      Objective:   Physical Exam  BP 120/62  Pulse 68  Temp 97.5 F (36.4 C) (Oral)  Ht 5\' 3"  (1.6 m)  Wt 160 lb 12.8 oz (72.938 kg)  BMI 28.48 kg/m2  SpO2 96% Wt Readings from Last 3 Encounters:  09/22/12 160 lb 12.8 oz (72.938 kg)  12/24/11 169 lb 3.2 oz (76.749 kg)  11/26/11 166 lb (75.297 kg)   Constitutional: She appears well-developed and well-nourished. No distress.  daughter at side Neck: Normal range of motion. Neck supple. No JVD present. No thyromegaly present.  Cardiovascular: Normal rate, regular rhythm and normal heart sounds.  No murmur heard. no BLE edema Pulmonary/Chest: Effort normal and breath sounds normal. No  respiratory distress. She has no wheezes.  Psychiatric: She has an anxious mood and affect, but not tearful today. Her behavior is normal. Judgment and thought content normal.   Lab Results  Component Value Date   WBC 5.1 09/30/2011   HGB 14.0 09/30/2011   HCT 41.2 09/30/2011   PLT 187 09/30/2011   GLUCOSE 92 09/30/2011   CHOL 196 04/11/2011   TRIG 141.0 04/11/2011   HDL 57.90 04/11/2011   LDLCALC 110* 04/11/2011   ALT 18 04/11/2011   AST 20 04/11/2011   NA 138 09/30/2011   K 4.5 09/30/2011   CL 104 09/30/2011   CREATININE 1.10 09/30/2011   BUN 20 09/30/2011   CO2 26 09/30/2011   TSH 1.26 03/17/2010   INR 1.0 06/30/2007   HGBA1C  Value: 5.7 (NOTE) The ADA recommends the following therapeutic goal for glycemic control related to Hgb A1c measurement: Goal of therapy: <6.5 Hgb A1c  Reference: American Diabetes Association: Clinical Practice Recommendations 2010, Diabetes Care, 2010, 33: (Suppl  1). 11/07/2009       Assessment & Plan:  AWV/v70.0 - Today patient counseled on age appropriate routine health concerns for screening and prevention, each reviewed and up to date or declined. Immunizations reviewed and up to date or declined. Labs ordered/ reviewed. Risk factors for depression reviewed and negative. Hearing function and visual acuity are intact. ADLs screened and addressed as needed. Functional ability and level of safety reviewed and appropriate. Education, counseling and referrals performed based on assessed risks today. Patient provided with a copy of personalized plan for preventive services.  Also see problem list. Medications and labs reviewed today.

## 2012-09-22 NOTE — Patient Instructions (Addendum)
It was good to see you today. We have reviewed your prior records including labs and tests today Health Maintenance reviewed - all recommended immunizations and age-appropriate screenings are up-to-date. Medications reviewed, no changes at this time. Test(s) ordered today. Your results will be released to MyChart (or called to you) after review, usually within 72hours after test completion. If any changes need to be made, you will be notified at that same time. we'll make referral for bone density scan. Our office will contact you regarding appointment(s) once made. Please schedule followup in 6-12 months for blood pressure check and medication review, call sooner if problems. Health Maintenance, Females A healthy lifestyle and preventative care can promote health and wellness.  Maintain regular health, dental, and eye exams.   Eat a healthy diet. Foods like vegetables, fruits, whole grains, low-fat dairy products, and lean protein foods contain the nutrients you need without too many calories. Decrease your intake of foods high in solid fats, added sugars, and salt. Get information about a proper diet from your caregiver, if necessary.   Regular physical exercise is one of the most important things you can do for your health. Most adults should get at least 150 minutes of moderate-intensity exercise (any activity that increases your heart rate and causes you to sweat) each week. In addition, most adults need muscle-strengthening exercises on 2 or more days a week.     Maintain a healthy weight. The body mass index (BMI) is a screening tool to identify possible weight problems. It provides an estimate of body fat based on height and weight. Your caregiver can help determine your BMI, and can help you achieve or maintain a healthy weight. For adults 20 years and older:   A BMI below 18.5 is considered underweight.   A BMI of 18.5 to 24.9 is normal.   A BMI of 25 to 29.9 is considered overweight.    A BMI of 30 and above is considered obese.   Maintain normal blood lipids and cholesterol by exercising and minimizing your intake of saturated fat. Eat a balanced diet with plenty of fruits and vegetables. Blood tests for lipids and cholesterol should begin at age 17 and be repeated every 5 years. If your lipid or cholesterol levels are high, you are over 50, or you are a high risk for heart disease, you may need your cholesterol levels checked more frequently. Ongoing high lipid and cholesterol levels should be treated with medicines if diet and exercise are not effective.   If you smoke, find out from your caregiver how to quit. If you do not use tobacco, do not start.   If you are pregnant, do not drink alcohol. If you are breastfeeding, be very cautious about drinking alcohol. If you are not pregnant and choose to drink alcohol, do not exceed 1 drink per day. One drink is considered to be 12 ounces (355 mL) of beer, 5 ounces (148 mL) of wine, or 1.5 ounces (44 mL) of liquor.   Avoid use of street drugs. Do not share needles with anyone. Ask for help if you need support or instructions about stopping the use of drugs.   High blood pressure causes heart disease and increases the risk of stroke. Blood pressure should be checked at least every 1 to 2 years. Ongoing high blood pressure should be treated with medicines, if weight loss and exercise are not effective.   If you are 45 to 77 years old, ask your caregiver if you should  take aspirin to prevent strokes.   Diabetes screening involves taking a blood sample to check your fasting blood sugar level. This should be done once every 3 years, after age 52, if you are within normal weight and without risk factors for diabetes. Testing should be considered at a younger age or be carried out more frequently if you are overweight and have at least 1 risk factor for diabetes.   Breast cancer screening is essential preventative care for women. You  should practice "breast self-awareness." This means understanding the normal appearance and feel of your breasts and may include breast self-examination. Any changes detected, no matter how small, should be reported to a caregiver. Women in their 68s and 30s should have a clinical breast exam (CBE) by a caregiver as part of a regular health exam every 1 to 3 years. After age 19, women should have a CBE every year. Starting at age 56, women should consider having a mammogram (breast X-ray) every year. Women who have a family history of breast cancer should talk to their caregiver about genetic screening. Women at a high risk of breast cancer should talk to their caregiver about having an MRI and a mammogram every year.   The Pap test is a screening test for cervical cancer. Women should have a Pap test starting at age 39. Between ages 33 and 14, Pap tests should be repeated every 2 years. Beginning at age 7, you should have a Pap test every 3 years as long as the past 3 Pap tests have been normal. If you had a hysterectomy for a problem that was not cancer or a condition that could lead to cancer, then you no longer need Pap tests. If you are between ages 78 and 36, and you have had normal Pap tests going back 10 years, you no longer need Pap tests. If you have had past treatment for cervical cancer or a condition that could lead to cancer, you need Pap tests and screening for cancer for at least 20 years after your treatment. If Pap tests have been discontinued, risk factors (such as a new sexual partner) need to be reassessed to determine if screening should be resumed. Some women have medical problems that increase the chance of getting cervical cancer. In these cases, your caregiver may recommend more frequent screening and Pap tests.   The human papillomavirus (HPV) test is an additional test that may be used for cervical cancer screening. The HPV test looks for the virus that can cause the cell changes on  the cervix. The cells collected during the Pap test can be tested for HPV. The HPV test could be used to screen women aged 48 years and older, and should be used in women of any age who have unclear Pap test results. After the age of 61, women should have HPV testing at the same frequency as a Pap test.   Colorectal cancer can be detected and often prevented. Most routine colorectal cancer screening begins at the age of 32 and continues through age 76. However, your caregiver may recommend screening at an earlier age if you have risk factors for colon cancer. On a yearly basis, your caregiver may provide home test kits to check for hidden blood in the stool. Use of a small camera at the end of a tube, to directly examine the colon (sigmoidoscopy or colonoscopy), can detect the earliest forms of colorectal cancer. Talk to your caregiver about this at age 83, when routine screening  begins. Direct examination of the colon should be repeated every 5 to 10 years through age 18, unless early forms of pre-cancerous polyps or small growths are found.   Hepatitis C blood testing is recommended for all people born from [birth year] through 1965 and any individual with known risks for hepatitis C.   Practice safe sex. Use condoms and avoid high-risk sexual practices to reduce the spread of sexually transmitted infections (STIs). Sexually active women aged 71 and younger should be checked for Chlamydia, which is a common sexually transmitted infection. Older women with new or multiple partners should also be tested for Chlamydia. Testing for other STIs is recommended if you are sexually active and at increased risk.   Osteoporosis is a disease in which the bones lose minerals and strength with aging. This can result in serious bone fractures. The risk of osteoporosis can be identified using a bone density scan. Women ages 79 and over and women at risk for fractures or osteoporosis should discuss screening with their caregivers.  Ask your caregiver whether you should be taking a calcium supplement or vitamin D to reduce the rate of osteoporosis.   Menopause can be associated with physical symptoms and risks. Hormone replacement therapy is available to decrease symptoms and risks. You should talk to your caregiver about whether hormone replacement therapy is right for you.   Use sunscreen with a sun protection factor (SPF) of 30 or greater. Apply sunscreen liberally and repeatedly throughout the day. You should seek shade when your shadow is shorter than you. Protect yourself by wearing long sleeves, pants, a wide-brimmed hat, and sunglasses year round, whenever you are outdoors.   Notify your caregiver of new moles or changes in moles, especially if there is a change in shape or color. Also notify your caregiver if a mole is larger than the size of a pencil eraser.   Stay current with your immunizations.  Document Released: 02/26/2011 Document Revised: 11/05/2011 Document Reviewed: 02/26/2011 [facility] Patient Information 2013 [place], [place].

## 2012-09-22 NOTE — Assessment & Plan Note (Signed)
Postsurgical 09/2008 for thyroid cancer tx Follows with endo (balan) for same - reports recent labs "normal" The current medical regimen is effective;  continue present plan and medications.

## 2012-09-22 NOTE — Assessment & Plan Note (Signed)
exacerbated by home situation (disabled dtr with mood instability at home - PTSD and widowed 2008) Complicated by insomnia - using elavil for same Prev tx with wellbutrin not tolerated  Intermittent compliance with low dose Paxil since 11/2011 Also uses xanax and elavil for sleep/"nerves"

## 2012-09-24 ENCOUNTER — Encounter: Payer: Self-pay | Admitting: *Deleted

## 2012-10-01 ENCOUNTER — Inpatient Hospital Stay: Admission: RE | Admit: 2012-10-01 | Payer: Medicare Other | Source: Ambulatory Visit

## 2012-10-06 ENCOUNTER — Other Ambulatory Visit: Payer: Self-pay | Admitting: Internal Medicine

## 2012-10-24 ENCOUNTER — Ambulatory Visit (INDEPENDENT_AMBULATORY_CARE_PROVIDER_SITE_OTHER): Payer: Medicare Other | Admitting: Surgery

## 2012-10-24 ENCOUNTER — Encounter (INDEPENDENT_AMBULATORY_CARE_PROVIDER_SITE_OTHER): Payer: Self-pay | Admitting: Surgery

## 2012-10-24 VITALS — BP 120/76 | HR 67 | Temp 97.2°F | Resp 18 | Ht 63.0 in | Wt 164.2 lb

## 2012-10-24 DIAGNOSIS — C73 Malignant neoplasm of thyroid gland: Secondary | ICD-10-CM

## 2012-10-24 NOTE — Progress Notes (Addendum)
[facility]  [name], MD,  FACS [address] [place]  [address] [place], [place]    [address] Phone:  [phone] FAX:  [phone]   Re:   [name] DOB:   July 27, 1928 MRN:   [id number]  ASSESSMENT AND PLAN: 1. Follicular variant of papillary ca, T2 lesion.   Total thyroidectomy 10/01/2008..  Nuclear med I-131 whole body scan last done 04/28/2010 - neg.  Serum Thyroglobulin - <0.1 ng/ml - 09/22/2012  Disease free at this time.   She is seeing Dr. Talmage Nap and had recent blood work.  On Synthroid 100 mcgm/day.  Otherwise I will see her in 1 year.  Her next visit will be 5 years from her original surgery and if she is doing well, will probably be her last visit.  2. Thyroid function tests followed by Dr. Assunta Found.   On stable synthroid dose.  3. 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  4. Lower extremity neuropathy, left > right.  But this is better.  She only saw Dr. Danielle Dess once. 5.  On Plavix -  She had a small stroke over 10 years ago.  HISTORY OF PRESENT ILLNESS: Chief Complaint  Patient presents with  . Follow-up    reck thyroid    [name] is a 79 y.o. (DOB: [birth year]? )  white female who is a patient of Rene Paci, MD and comes to me today for thyroid cancer follow up.  She had a total thyroidectomy by me on 10/01/2008 for a 2.3 cm follicular variant of a papillary carcinoma with negative margins. She had a thyrogen stimulated whole body scan on 04/29/2009 that was negative.   Dr. Willey Blade is her PCP. Dr. Assunta Found sees her for endocrine and she is on 100 mcg of Synthroid and her labs are being followed by Dr. Talmage Nap.   Her main complain is a boney area in her left neck. She still has very limited neck movement.  But she is otherwise doing very well.  PHYSICAL EXAM: BP 120/76  Pulse 67  Temp(Src) 97.2 F (36.2 C) (Temporal)  Resp 18  Ht 5\' 3"  (1.6 m)  Wt 164 lb 3.2 oz (74.481 kg)  BMI 29.09 kg/m2  Neck: Her neck is stiff and  she has trouble turning her head even a few degrees to the left or right. I feel no mass. Particular attention was paid to her area of complaint and I cannot feel anything to worry about. Lymph nodes: I feel no cervical or supraclavicular nodes.  Lungs: Clear to auscultation.  DATA REVIEWED: No new data.  [name], MD, FACS Office:  [phone]

## 2012-11-19 ENCOUNTER — Other Ambulatory Visit: Payer: Self-pay | Admitting: Internal Medicine

## 2012-12-02 ENCOUNTER — Encounter (INDEPENDENT_AMBULATORY_CARE_PROVIDER_SITE_OTHER): Payer: Self-pay

## 2013-01-02 ENCOUNTER — Other Ambulatory Visit: Payer: Self-pay | Admitting: Internal Medicine

## 2013-01-24 ENCOUNTER — Other Ambulatory Visit: Payer: Self-pay | Admitting: Internal Medicine

## 2013-01-28 ENCOUNTER — Other Ambulatory Visit: Payer: Self-pay | Admitting: Internal Medicine

## 2013-02-27 ENCOUNTER — Other Ambulatory Visit: Payer: Self-pay | Admitting: Internal Medicine

## 2013-03-01 ENCOUNTER — Other Ambulatory Visit: Payer: Self-pay | Admitting: Internal Medicine

## 2013-03-04 ENCOUNTER — Other Ambulatory Visit: Payer: Self-pay | Admitting: Internal Medicine

## 2013-03-20 ENCOUNTER — Other Ambulatory Visit: Payer: Self-pay | Admitting: Internal Medicine

## 2013-04-22 ENCOUNTER — Ambulatory Visit: Payer: Medicare Other | Admitting: Internal Medicine

## 2013-04-24 ENCOUNTER — Ambulatory Visit (INDEPENDENT_AMBULATORY_CARE_PROVIDER_SITE_OTHER): Payer: Medicare Other | Admitting: Internal Medicine

## 2013-04-24 ENCOUNTER — Other Ambulatory Visit (INDEPENDENT_AMBULATORY_CARE_PROVIDER_SITE_OTHER): Payer: Medicare Other

## 2013-04-24 ENCOUNTER — Encounter: Payer: Self-pay | Admitting: Internal Medicine

## 2013-04-24 VITALS — BP 140/80 | HR 78 | Temp 97.9°F | Wt 174.2 lb

## 2013-04-24 DIAGNOSIS — H9190 Unspecified hearing loss, unspecified ear: Secondary | ICD-10-CM

## 2013-04-24 DIAGNOSIS — I10 Essential (primary) hypertension: Secondary | ICD-10-CM

## 2013-04-24 DIAGNOSIS — E039 Hypothyroidism, unspecified: Secondary | ICD-10-CM

## 2013-04-24 DIAGNOSIS — R635 Abnormal weight gain: Secondary | ICD-10-CM

## 2013-04-24 DIAGNOSIS — R7309 Other abnormal glucose: Secondary | ICD-10-CM

## 2013-04-24 DIAGNOSIS — N390 Urinary tract infection, site not specified: Secondary | ICD-10-CM

## 2013-04-24 DIAGNOSIS — H9193 Unspecified hearing loss, bilateral: Secondary | ICD-10-CM

## 2013-04-24 DIAGNOSIS — F329 Major depressive disorder, single episode, unspecified: Secondary | ICD-10-CM

## 2013-04-24 DIAGNOSIS — R739 Hyperglycemia, unspecified: Secondary | ICD-10-CM

## 2013-04-24 LAB — POCT URINALYSIS DIPSTICK
Glucose, UA: NEGATIVE
Nitrite, UA: NEGATIVE
Urobilinogen, UA: NEGATIVE

## 2013-04-24 LAB — HEMOGLOBIN A1C: Hgb A1c MFr Bld: 5.7 % (ref 4.6–6.5)

## 2013-04-24 LAB — TSH: TSH: 1.25 u[IU]/mL (ref 0.35–5.50)

## 2013-04-24 MED ORDER — SULFAMETHOXAZOLE-TMP DS 800-160 MG PO TABS: 1.0000 | ORAL_TABLET | Freq: Two times a day (BID) | ORAL | Status: DC

## 2013-04-24 NOTE — Assessment & Plan Note (Signed)
exacerbated by home situation (disabled dtr with mood instability at home; EtOH, PTSD and widowed 2008) Complicated by insomnia - Prev tx with wellbutrin not tolerated  Intermittent compliance with low dose Paxil since 11/2011 Also uses xanax and elavil for sleep/"nerves" Support offered - continue same

## 2013-04-24 NOTE — Assessment & Plan Note (Signed)
chronically labile BP readings started ARB 09/2011 - taking 1/2-1 tab due to dizziness symptoms reviewed Echo and carotids 10/2011 - ok BP Readings from Last 3 Encounters:  04/24/13 140/80  10/24/12 120/76  09/22/12 120/62

## 2013-04-24 NOTE — Progress Notes (Signed)
Subjective:    Patient ID: [name], female    DOB: 11-13-1927, 79 y.o.   MRN: [id number]  Urinary Tract Infection  This is a recurrent problem. The current episode started in the past 7 days. The problem occurs intermittently. The problem has been waxing and waning. The quality of the pain is described as burning. The pain is moderate. The maximum temperature recorded prior to her arrival was 100 - 100.9 F. She is not sexually active. There is no history of pyelonephritis. Associated symptoms include frequency and urgency. Pertinent negatives include no flank pain, hematuria or nausea. She has tried increased fluids for the symptoms. Her past medical history is significant for recurrent UTIs and a urological procedure.  also here for follow up:  HTN - started ARB for same 09/2011 - taking only 1/2 tab as full tab causing AM orthostatic symptoms -   Depression/anxiety: Manifest with tearfulness and "anxiety"/nervousness; associated with excess physical fatigue and irregular sleep - started wellbutrin trial 08/2010 at dtr's urging - caused nausea so stopped med after 4 days -then began low dose amitrip summer 2012 - takes only "as i need to for my mood and sleep" - still with ongoing nervousness but reluctant to take medications that make her feel sleepy - prescribed paxil 4/13 but not taking regularly  Hypothyroid - follows with endo semiannually for same - weight gin reviewed - denies does changes  Past Medical History  Diagnosis Date  . CVA 1998    "mini" cva, no residual deficts  . ARTHRITIS   . URINARY INCONTINENCE   . DEPRESSION   . DDD (degenerative disc disease)     Cervical spine & knees  . GERD   . DYSLIPIDEMIA   . HYPERTENSION   . HYPOTHYROIDISM   . PEPTIC ULCER DISEASE 11/2009  . CARCINOMA, THYROID GLAND, PAPILLARY 09/2008    s/p total thyroidectomy  . Osteoporosis      Review of Systems  Constitutional: Positive for fatigue and unexpected weight change. Negative  for fever.  Respiratory: Negative for cough and shortness of breath.   Cardiovascular: Negative for chest pain and palpitations.  Gastrointestinal: Negative for nausea.  Genitourinary: Positive for dysuria, urgency and frequency. Negative for hematuria and flank pain.       Objective:   Physical Exam BP 140/80  Pulse 78  Temp(Src) 97.9 F (36.6 C) (Oral)  Wt 174 lb 4 oz (79.039 kg)  BMI 30.87 kg/m2  SpO2 96% Wt Readings from Last 3 Encounters:  04/24/13 174 lb 4 oz (79.039 kg)  10/24/12 164 lb 3.2 oz (74.481 kg)  09/22/12 160 lb 12.8 oz (72.938 kg)   Constitutional: She appears well-developed and well-nourished. No distress.  daughter at side Neck: Normal range of motion. Neck supple. No JVD present. No thyromegaly present.  Cardiovascular: Normal rate, regular rhythm and normal heart sounds.  No murmur heard. no BLE edema Pulmonary/Chest: Effort normal and breath sounds normal. No respiratory distress. She has no wheezes.  GU: defer to gynecology Psychiatric: She has an anxious mood and affect, occ tearful. Her behavior is normal. Judgment and thought content normal.   Lab Results  Component Value Date   WBC 5.1 09/30/2011   HGB 14.0 09/30/2011   HCT 41.2 09/30/2011   PLT 187 09/30/2011   GLUCOSE 92 09/30/2011   CHOL 199 09/22/2012   TRIG 134.0 09/22/2012   HDL 42.40 09/22/2012   LDLCALC 130* 09/22/2012   ALT 18 04/11/2011   AST 20 04/11/2011  NA 138 09/30/2011   K 4.5 09/30/2011   CL 104 09/30/2011   CREATININE 1.10 09/30/2011   BUN 20 09/30/2011   CO2 26 09/30/2011   TSH 1.26 03/17/2010   INR 1.0 06/30/2007   HGBA1C  Value: 5.7 (NOTE) The ADA recommends the following therapeutic goal for glycemic control related to Hgb A1c measurement: Goal of therapy: <6.5 Hgb A1c  Reference: American Diabetes Association: Clinical Practice Recommendations 2010, Diabetes Care, 2010, 33: (Suppl  1). 11/07/2009        Assessment & Plan:  See problem list. Medications and labs reviewed today.  UTI? -  mild LE on Udip - tx empirically given classic symptoms and hx same - follow up uro as needed

## 2013-04-24 NOTE — Patient Instructions (Signed)
It was good to see you today. We have reviewed your prior records including labs and tests today Medications reviewed and updated - antibiotics for bladder - no other changes at this time. Test(s) ordered today. Your results will be released to MyChart (or called to you) after review, usually within 72hours after test completion. If any changes need to be made, you will be notified at that same time. we'll make referral for hering test . Our office will contact you regarding appointment(s) once made. Please schedule followup in 6 months for annual visit and medication review, call sooner if problems.

## 2013-04-24 NOTE — Assessment & Plan Note (Signed)
Postsurgical 09/2008 for thyroid cancer tx Follows with endo (balan) for same - reports last labs "normal" Given weight gain, recheck now Lab Results  Component Value Date   TSH 1.26 03/17/2010

## 2013-05-02 ENCOUNTER — Other Ambulatory Visit: Payer: Self-pay | Admitting: Internal Medicine

## 2013-05-04 NOTE — Telephone Encounter (Signed)
[facility]ed [facility]...lmb 

## 2013-05-13 ENCOUNTER — Other Ambulatory Visit: Payer: Self-pay | Admitting: Internal Medicine

## 2013-05-16 ENCOUNTER — Other Ambulatory Visit: Payer: Self-pay | Admitting: Internal Medicine

## 2013-05-20 ENCOUNTER — Other Ambulatory Visit: Payer: Self-pay | Admitting: Internal Medicine

## 2013-06-04 LAB — HM MAMMOGRAPHY

## 2013-06-05 ENCOUNTER — Encounter: Payer: Self-pay | Admitting: Internal Medicine

## 2013-06-11 ENCOUNTER — Encounter: Payer: Self-pay | Admitting: Internal Medicine

## 2013-06-15 ENCOUNTER — Other Ambulatory Visit: Payer: Self-pay | Admitting: Internal Medicine

## 2013-06-23 ENCOUNTER — Other Ambulatory Visit: Payer: Self-pay | Admitting: Internal Medicine

## 2013-06-24 NOTE — Telephone Encounter (Signed)
Called [facility] spoke with Cliff/pharmacist gave regina approval...lmb

## 2013-06-24 NOTE — Telephone Encounter (Signed)
Ok to refill-phone in please 

## 2013-07-17 ENCOUNTER — Ambulatory Visit: Payer: Medicare Other | Admitting: Family Medicine

## 2013-07-17 DIAGNOSIS — Z0289 Encounter for other administrative examinations: Secondary | ICD-10-CM

## 2013-07-18 ENCOUNTER — Encounter: Payer: Self-pay | Admitting: Emergency Medicine

## 2013-07-18 ENCOUNTER — Emergency Department (INDEPENDENT_AMBULATORY_CARE_PROVIDER_SITE_OTHER): Payer: Medicare Other

## 2013-07-18 ENCOUNTER — Emergency Department (INDEPENDENT_AMBULATORY_CARE_PROVIDER_SITE_OTHER)
Admission: EM | Admit: 2013-07-18 | Discharge: 2013-07-18 | Disposition: A | Discharge status: Home / Self Care | Payer: Medicare Other | Source: Home / Self Care | Attending: Family Medicine | Admitting: Family Medicine

## 2013-07-18 DIAGNOSIS — J209 Acute bronchitis, unspecified: Secondary | ICD-10-CM

## 2013-07-18 DIAGNOSIS — R05 Cough: Secondary | ICD-10-CM

## 2013-07-18 MED ORDER — BENZONATATE 200 MG PO CAPS: 200.0000 mg | ORAL_CAPSULE | Freq: Every day | ORAL | Status: DC

## 2013-07-18 MED ORDER — CEFDINIR 300 MG PO CAPS: 300.0000 mg | ORAL_CAPSULE | Freq: Two times a day (BID) | ORAL | Status: DC

## 2013-07-18 NOTE — ED Notes (Signed)
Patient reports receiving Flu vaccination 2 weeks ago, followed by onset of productive cough (sputum), congestion and progressing to ear pain, rib pain from coughing, general aches and fatigue. Is taking liquids and eating.

## 2013-07-18 NOTE — ED Provider Notes (Signed)
CSN: [id number]     Arrival date & time 07/18/13  1216 History   First MD Initiated Contact with Patient 07/18/13 1245     Chief Complaint  Patient presents with  . Cough  . Nasal Congestion  . Otalgia  . Chest Pain  . Generalized Body Aches    HPI Comments: Patient developed cold-like symptoms about two weeks ago and has had persistent sore throat, partly productive cough, fatigue, low grade fever, myalgias, and sinus congestion.  She has now developed left facial and ear pain, and soreness in her right anterior ribs.  The history is provided by the patient and a relative.    Past Medical History  Diagnosis Date  . CVA 1998    "mini" cva, no residual deficts  . ARTHRITIS   . URINARY INCONTINENCE   . DEPRESSION   . DDD (degenerative disc disease)     Cervical spine & knees  . GERD   . DYSLIPIDEMIA   . HYPERTENSION   . HYPOTHYROIDISM   . PEPTIC ULCER DISEASE 11/2009  . CARCINOMA, THYROID GLAND, PAPILLARY 09/2008    s/p total thyroidectomy  . Osteoporosis    Past Surgical History  Procedure Laterality Date  . Cholecystectomy    . Abdominal hysterectomy    . Thyroidectomy  2/30/10    papillary, s/p total  . Breast biopsy    . Back surgery    . Tonsillectomy    . Appendectomy    . Incontinence surgery    . Wrist surgery      LEFT  . Cardiac catheterization  03/30/2005    NORMAL EF. NORMAL CORONARIES  . Cardiovascular stress test  01/27/2003    NO EVIDENCE OF ISCHEMIA. EF 74%  . US echocardiography  03/16/2009    EF 55-60%  . US echocardiography  01/26/2003    EF 60-65%   Family History  Problem Relation Age of Onset  . Arthritis Mother   . Diabetes Mother   . Hyperlipidemia Mother   . Arthritis Father   . Diabetes Father   . Hyperlipidemia Father   . Breast cancer Maternal Grandmother   . Cancer Sister     breast   History  Substance Use Topics  . Smoking status: Never Smoker   . Smokeless tobacco: Not on file     Comment: widowed, lives with daughter "who  is an addict" per pt. Retired, enjoying gardening  . Alcohol Use: No   OB History   Grav Para Term Preterm Abortions TAB SAB Ect Mult Living                 Review of Systems + sore throat + cough ? pleuritic pain + wheezing + nasal congestion + post-nasal drainage + sinus pain/pressure No itchy/red eyes ? earache No hemoptysis + SOB + low grade fever, + chills + nausea No vomiting No abdominal pain; + constipation No diarrhea No urinary symptoms No skin rash + fatigue + myalgias + headache Used OTC meds without relief  Allergies  Iodine  Home Medications   Current Outpatient Rx  Name  Route  Sig  Dispense  Refill  . ALPRAZolam (XANAX) 0.5 MG tablet      TAKE 1 TABLET BY MOUTH 3 TIMES A DAY AS NEEDED FOR ANXIETY OR SLEEP   90 tablet   0   . amitriptyline (ELAVIL) 25 MG tablet      TAKE 1 TABLET (25 MG TOTAL) BY MOUTH AT BEDTIME.   30 tablet  5   . aspirin 81 MG tablet   Oral   Take 81 mg by mouth daily.           . benzonatate (TESSALON) 200 MG capsule   Oral   Take 1 capsule (200 mg total) by mouth at bedtime. Take as needed for cough   12 capsule   0   . cefdinir (OMNICEF) 300 MG capsule   Oral   Take 1 capsule (300 mg total) by mouth 2 (two) times daily.   20 capsule   0   . clopidogrel (PLAVIX) 75 MG tablet      TAKE 1 TABLET EVERY DAY   30 tablet   10   . fluocinonide-emollient (LIDEX-E) 0.05 % cream   Topical   Apply 1 application topically 2 (two) times a week.          . indomethacin (INDOCIN) 25 MG capsule      TAKE ONE CAPSULE BY MOUTH EVERY DAY AS NEEDED   60 capsule   1   . indomethacin (INDOCIN) 25 MG capsule      TAKE ONE CAPSULE BY MOUTH EVERY DAY AS NEEDED   60 capsule   1   . levothyroxine (SYNTHROID, LEVOTHROID) 100 MCG tablet   Oral   Take 1 tablet (100 mcg total) by mouth daily.         Marland Kitchen losartan (COZAAR) 50 MG tablet      TAKE 1 TABLET EVERY DAY   30 tablet   6   . methocarbamol (ROBAXIN)  500 MG tablet      TAKE 1 TO 2 TABLETS BY MOUTH EVERY DAY AS NEEDED   60 tablet   2   . pantoprazole (PROTONIX) 40 MG tablet   Oral   Take 40 mg by mouth 2 (two) times daily.           Marland Kitchen PARoxetine (PAXIL) 10 MG tablet      TAKE 1 TABLET IN THE MORNING   30 tablet   5   . pravastatin (PRAVACHOL) 40 MG tablet      TAKE 1 TABLET (40 MG TOTAL) BY MOUTH DAILY.   30 tablet   8   . promethazine (PHENERGAN) 12.5 MG tablet      TAKE 1 TABLET EVERY 8 HOURS AS NEEDED FOR NAUSEA   30 tablet   1   . sulfamethoxazole-trimethoprim (BACTRIM DS) 800-160 MG per tablet   Oral   Take 1 tablet by mouth 2 (two) times daily.   10 tablet   0   . traMADol-acetaminophen (ULTRACET) 37.5-325 MG per tablet      TAKE 1 TABLET BY MOUTH TWICE A DAY AS NEEDED FOR PAIN   40 tablet   1    BP 134/77  Pulse 58  Temp(Src) 98.1 F (36.7 C) (Oral)  Resp 18  Wt 174 lb (78.926 kg)  SpO2 97% Physical Exam Nursing notes and Vital Signs reviewed. Appearance:  Patient appears  stated age, ad in no acute distress Eyes:  Pupils are equal, round, and reactive to light and accomodation.  Extraocular movement is intact.  Conjunctivae are not inflamed  Ears:  Canals normal.  Tympanic membranes normal.  Nose:  Mildly congested turbinates.  Maxillary sinus tenderness is present.  Pharynx:  Normal; moist mucous membranes  Neck:  Supple.  Slightly tender shotty posterior nodes are palpated bilaterally  Lungs:  Clear to auscultation.  Breath sounds are equal.  There is mild tenderness over right anterior  costal margin. Heart:  Regular rate and rhythm without murmurs, rubs, or gallops.  Abdomen:  Nontender without masses or hepatosplenomegaly.  Bowel sounds are present.  No CVA or flank tenderness.  Extremities:  No edema.  No calf tenderness Skin:  No rash present.   ED Course  Procedures      Imaging Review Dg Chest 2 View  07/18/2013   CLINICAL DATA:  Cough.  EXAM: CHEST  2 VIEW  COMPARISON:  November 07, 2009.  FINDINGS: The heart size and mediastinal contours are within normal limits. Both lungs are clear. Mild thoracic kyphosis is noted.  IMPRESSION: No active cardiopulmonary disease.   Electronically Signed   By: Roque Lias M.D.   On: 07/18/2013 13:38      MDM   1. Acute bronchitis; ? Maxillary sinusitis    Begin Omnicef.  Prescription written for Benzonatate [facility]) to take at bedtime for night-time cough.  Take plain Mucinex (1200mg  guaifenesin) twice daily for cough and congestion.  Increase fluid intake, rest. May use Afrin nasal spray (or generic oxymetazoline) twice daily for about 5 days.  Also recommend using saline nasal spray several times daily and saline nasal irrigation (AYR is a common brand) Stop all antihistamines for now, and other non-prescription cough/cold preparations. Follow-up with family doctor if not improving about one week.    Lattie Haw, MD 07/19/13 [phone]

## 2013-07-30 ENCOUNTER — Telehealth: Payer: Self-pay | Admitting: *Deleted

## 2013-07-30 NOTE — Telephone Encounter (Signed)
Daughter left VM stating wanting to inform md on mother problems. She has too much pride to ask for help. She is having problem walking. She has fell 3 x's in the past 2 weeks. She is forgetfull, Can't manage her finances. Has bounce so many checks. Mom is not taking her paxil medication. She is coming in on 08/03/13 wanting md to address...lmb

## 2013-07-31 NOTE — Telephone Encounter (Signed)
Called daughter Loralee Pacas) no answer LMOM md response...Raechel Chute

## 2013-07-31 NOTE — Telephone Encounter (Signed)
Noted - please let dtr know i am aware and will request social worker to help [place] address same (plan refer to [facility] at [facility] 12/8)

## 2013-08-03 ENCOUNTER — Other Ambulatory Visit (INDEPENDENT_AMBULATORY_CARE_PROVIDER_SITE_OTHER): Payer: Medicare Other

## 2013-08-03 ENCOUNTER — Encounter: Payer: Self-pay | Admitting: Internal Medicine

## 2013-08-03 ENCOUNTER — Ambulatory Visit (INDEPENDENT_AMBULATORY_CARE_PROVIDER_SITE_OTHER): Payer: Medicare Other | Admitting: Internal Medicine

## 2013-08-03 VITALS — BP 132/70 | HR 66 | Temp 98.2°F | Resp 16 | Wt 179.1 lb

## 2013-08-03 DIAGNOSIS — F329 Major depressive disorder, single episode, unspecified: Secondary | ICD-10-CM

## 2013-08-03 DIAGNOSIS — R413 Other amnesia: Secondary | ICD-10-CM

## 2013-08-03 DIAGNOSIS — R269 Unspecified abnormalities of gait and mobility: Secondary | ICD-10-CM

## 2013-08-03 DIAGNOSIS — I10 Essential (primary) hypertension: Secondary | ICD-10-CM

## 2013-08-03 DIAGNOSIS — F3289 Other specified depressive episodes: Secondary | ICD-10-CM

## 2013-08-03 LAB — CBC WITH DIFFERENTIAL/PLATELET
Basophils Relative: 0.6 % (ref 0.0–3.0)
Eosinophils Absolute: 0.4 10*3/uL (ref 0.0–0.7)
Eosinophils Relative: 4.2 % (ref 0.0–5.0)
HCT: 37.4 % (ref 36.0–46.0)
Hemoglobin: 12.7 g/dL (ref 12.0–15.0)
Lymphs Abs: 1.7 10*3/uL (ref 0.7–4.0)
MCHC: 33.9 g/dL (ref 30.0–36.0)
MCV: 90.2 fl (ref 78.0–100.0)
Monocytes Absolute: 0.5 10*3/uL (ref 0.1–1.0)
Neutro Abs: 6 10*3/uL (ref 1.4–7.7)
RBC: 4.15 Mil/uL (ref 3.87–5.11)
WBC: 8.6 10*3/uL (ref 4.5–10.5)

## 2013-08-03 LAB — BASIC METABOLIC PANEL
CO2: 24 mEq/L (ref 19–32)
Chloride: 103 mEq/L (ref 96–112)
Creatinine, Ser: 1.3 mg/dL — ABNORMAL HIGH (ref 0.4–1.2)
Glucose, Bld: 99 mg/dL (ref 70–99)
Potassium: 4.8 mEq/L (ref 3.5–5.1)
Sodium: 134 mEq/L — ABNORMAL LOW (ref 135–145)

## 2013-08-03 LAB — VITAMIN B12: Vitamin B-12: 436 pg/mL (ref 211–911)

## 2013-08-03 LAB — HEPATIC FUNCTION PANEL
Albumin: 3.7 g/dL (ref 3.5–5.2)
Alkaline Phosphatase: 71 U/L (ref 39–117)

## 2013-08-03 MED ORDER — ALPRAZOLAM 0.25 MG PO TABS: 0.2500 mg | ORAL_TABLET | Freq: Three times a day (TID) | ORAL | Status: DC | PRN

## 2013-08-03 MED ORDER — CLOPIDOGREL BISULFATE 75 MG PO TABS: 75.0000 mg | ORAL_TABLET | Freq: Every day | ORAL | Status: DC

## 2013-08-03 MED ORDER — PRAVASTATIN SODIUM 40 MG PO TABS: ORAL_TABLET | ORAL | Status: DC

## 2013-08-03 MED ORDER — AMITRIPTYLINE HCL 25 MG PO TABS: 25.0000 mg | ORAL_TABLET | Freq: Every day | ORAL | Status: DC

## 2013-08-03 MED ORDER — LOSARTAN POTASSIUM 50 MG PO TABS: 50.0000 mg | ORAL_TABLET | Freq: Every day | ORAL | Status: DC

## 2013-08-03 MED ORDER — LEVOTHYROXINE SODIUM 100 MCG PO TABS: 100.0000 ug | ORAL_TABLET | Freq: Every day | ORAL | Status: DC

## 2013-08-03 NOTE — Progress Notes (Signed)
  Subjective:    Patient ID: [name], female    DOB: Dec 29, 1927, 79 y.o.   MRN: [id number]  HPI   here for follow up - Reviewed interval medical history and chronic medical concerns: see phone note last week from daughter in EMR acknowledging concerns with memory, balance problems and home finance management  Also HTN, depression/anxiety, hypothyroidism  Past Medical History  Diagnosis Date  . CVA 1998    "mini" cva, no residual deficts  . ARTHRITIS   . URINARY INCONTINENCE   . DEPRESSION   . DDD (degenerative disc disease)     Cervical spine & knees  . GERD   . DYSLIPIDEMIA   . HYPERTENSION   . HYPOTHYROIDISM   . PEPTIC ULCER DISEASE 11/2009  . CARCINOMA, THYROID GLAND, PAPILLARY 09/2008    s/p total thyroidectomy  . Osteoporosis      Review of Systems  Constitutional: Positive for fatigue. Negative for unexpected weight change.  Respiratory: Negative for cough and shortness of breath.   Psychiatric/Behavioral: Positive for behavioral problems, confusion, sleep disturbance, dysphoric mood and decreased concentration. Negative for suicidal ideas and self-injury. The patient is nervous/anxious.        Objective:   Physical Exam BP 132/70  Pulse 66  Temp(Src) 98.2 F (36.8 C) (Oral)  Resp 16  Wt 179 lb 1.3 oz (81.23 kg)  SpO2 95% Wt Readings from Last 3 Encounters:  08/03/13 179 lb 1.3 oz (81.23 kg)  07/18/13 174 lb (78.926 kg)  04/24/13 174 lb 4 oz (79.039 kg)   Constitutional: She appears well-developed and well-nourished. No distress.  daughter Vernona Rieger (who is bipolar) at side Neck: Normal range of motion. Neck supple. No JVD present. No thyromegaly present.  Cardiovascular: Normal rate, regular rhythm and normal heart sounds.  No murmur heard. no BLE edema Pulmonary/Chest: Effort normal and breath sounds normal. No respiratory distress. She has no wheezes.  Neurologic: awake alert and oriented x3. Distractible, unwilling/unable to participate in memory  recall. Moves all extremities well without gross deficit. Cranial nerves II through XII symmetrically intact. Speech is pressured but fluent. Gait unaided, slow but intact Psychiatric: She has an anxious mood and affect, occ tearful. Her behavior is normal. Judgment and thought content normal.   Lab Results  Component Value Date   WBC 5.1 09/30/2011   HGB 14.0 09/30/2011   HCT 41.2 09/30/2011   PLT 187 09/30/2011   GLUCOSE 92 09/30/2011   CHOL 199 09/22/2012   TRIG 134.0 09/22/2012   HDL 42.40 09/22/2012   LDLCALC 130* 09/22/2012   ALT 18 04/11/2011   AST 20 04/11/2011   NA 138 09/30/2011   K 4.5 09/30/2011   CL 104 09/30/2011   CREATININE 1.10 09/30/2011   BUN 20 09/30/2011   CO2 26 09/30/2011   TSH 1.25 04/24/2013   INR 1.0 06/30/2007   HGBA1C 5.7 04/24/2013   No results found for this basename: VITAMINB12       Assessment & Plan:  See problem list. Medications and labs reviewed today.  Progressive memory concerns  associated with gait disorder and falls.  ?ischemic dementia, NHP or other  Check screening labs now Check MRI brain given history of TIA Refer to neurology for further evaluation and treatment options Refer for home health PT/OT/aide  Eval at concern for overlapping and untreated depression with anxiety -?pseudodementia? But patient continues to decline medications for treatment of same

## 2013-08-03 NOTE — Assessment & Plan Note (Signed)
chronically labile BP readings started ARB 09/2011 - taking 1/2-1 tab due to dizziness symptoms reviewed Echo and carotids 10/2011 - ok BP Readings from Last 3 Encounters:  08/03/13 132/70  07/18/13 134/77  04/24/13 140/80

## 2013-08-03 NOTE — Patient Instructions (Addendum)
It was good to see you today.  We have reviewed your prior records including labs and tests today  Medications reviewed and updated -reduce xanax dose to 0.25 mg (not 0.5) - no other changes at this time.  Test(s) ordered today. Your results will be released to MyChart (or called to you) after review, usually within 72hours after test completion. If any changes need to be made, you will be notified at that same time.  we'll make referral for neurology to evaluate memory and balance/falls. Our office will contact you regarding appointment(s) once made.  we'll also make referral for home health - physical therapy and aide . Our office will contact you regarding appointment(s) once made.  Please schedule followup in 6 months for annual visit and medication review, call sooner if problems.  Fall Prevention and Home Safety Falls cause injuries and can affect all age groups. It is possible to use preventive measures to significantly decrease the likelihood of falls. There are many simple measures which can make your home safer and prevent falls. OUTDOORS  Repair cracks and edges of walkways and driveways.  Remove high doorway thresholds.  Trim shrubbery on the main path into your home.  Have good outside lighting.  Clear walkways of tools, rocks, debris, and clutter.  Check that handrails are not broken and are securely fastened. Both sides of steps should have handrails.  Have leaves, snow, and ice cleared regularly.  Use sand or salt on walkways during winter months.  In the garage, clean up grease or oil spills. BATHROOM  Install night lights.  Install grab bars by the toilet and in the tub and shower.  Use non-skid mats or decals in the tub or shower.  Place a plastic non-slip stool in the shower to sit on, if needed.  Keep floors dry and clean up all water on the floor immediately.  Remove soap buildup in the tub or shower on a regular basis.  Secure bath mats with  non-slip, double-sided rug tape.  Remove throw rugs and tripping hazards from the floors. BEDROOMS  Install night lights.  Make sure a bedside light is easy to reach.  Do not use oversized bedding.  Keep a telephone by your bedside.  Have a firm chair with side arms to use for getting dressed.  Remove throw rugs and tripping hazards from the floor. KITCHEN  Keep handles on pots and pans turned toward the center of the stove. Use back burners when possible.  Clean up spills quickly and allow time for drying.  Avoid walking on wet floors.  Avoid hot utensils and knives.  Position shelves so they are not too high or low.  Place commonly used objects within easy reach.  If necessary, use a sturdy step stool with a grab bar when reaching.  Keep electrical cables out of the way.  Do not use floor polish or wax that makes floors slippery. If you must use wax, use non-skid floor wax.  Remove throw rugs and tripping hazards from the floor. STAIRWAYS  Never leave objects on stairs.  Place handrails on both sides of stairways and use them. Fix any loose handrails. Make sure handrails on both sides of the stairways are as long as the stairs.  Check carpeting to make sure it is firmly attached along stairs. Make repairs to worn or loose carpet promptly.  Avoid placing throw rugs at the top or bottom of stairways, or properly secure the rug with carpet tape to prevent slippage. Get  rid of throw rugs, if possible.  Have an electrician put in a light switch at the top and bottom of the stairs. OTHER FALL PREVENTION TIPS  Wear low-heel or rubber-soled shoes that are supportive and fit well. Wear closed toe shoes.  When using a stepladder, make sure it is fully opened and both spreaders are firmly locked. Do not climb a closed stepladder.  Add color or contrast paint or tape to grab bars and handrails in your home. Place contrasting color strips on first and last steps.  Learn  and use mobility aids as needed. Install an electrical emergency response system.  Turn on lights to avoid dark areas. Replace light bulbs that burn out immediately. Get light switches that glow.  Arrange furniture to create clear pathways. Keep furniture in the same place.  Firmly attach carpet with non-skid or double-sided tape.  Eliminate uneven floor surfaces.  Select a carpet pattern that does not visually hide the edge of steps.  Be aware of all pets. OTHER HOME SAFETY TIPS  Set the water temperature for 120 F (48.8 C).  Keep emergency numbers on or near the telephone.  Keep smoke detectors on every level of the home and near sleeping areas. Document Released: 08/03/2002 Document Revised: 02/12/2012 Document Reviewed: 11/02/2011 [facility] Patient Information 2014 [place], [place].

## 2013-08-03 NOTE — Progress Notes (Signed)
Pre-visit discussion using our clinic review tool. No additional management support is needed unless otherwise documented below in the visit note.  

## 2013-08-03 NOTE — Assessment & Plan Note (Signed)
exacerbated by home situation (disabled dtr [name] with mood instability as primary caregiver; dtr with EtOH, PTSD hx - widowed 2008) Complicated by insomnia - Prev tx with wellbutrin not tolerated  Intermittent compliance with low dose Paxil since 11/2011 - DC from list now due to noncompliance Also uses xanax and elavil for sleep/"nerves", but will reduce dose xanax as dtr [name] feels this medication is "too sedating for mom" Support offered -

## 2013-08-05 ENCOUNTER — Encounter: Payer: Self-pay | Admitting: *Deleted

## 2013-08-10 ENCOUNTER — Inpatient Hospital Stay (HOSPITAL_COMMUNITY)
Admission: EM | Admit: 2013-08-10 | Discharge: 2013-08-19 | DRG: 025 | Disposition: A | Discharge status: Skilled Nursing Facility | Payer: Medicare Other | Attending: Internal Medicine | Admitting: Internal Medicine

## 2013-08-10 ENCOUNTER — Emergency Department (HOSPITAL_COMMUNITY): Payer: Medicare Other

## 2013-08-10 ENCOUNTER — Telehealth: Payer: Self-pay | Admitting: *Deleted

## 2013-08-10 ENCOUNTER — Encounter (HOSPITAL_COMMUNITY): Payer: Self-pay | Admitting: Emergency Medicine

## 2013-08-10 DIAGNOSIS — Z8711 Personal history of peptic ulcer disease: Secondary | ICD-10-CM

## 2013-08-10 DIAGNOSIS — E039 Hypothyroidism, unspecified: Secondary | ICD-10-CM | POA: Diagnosis present

## 2013-08-10 DIAGNOSIS — Z7982 Long term (current) use of aspirin: Secondary | ICD-10-CM

## 2013-08-10 DIAGNOSIS — C711 Malignant neoplasm of frontal lobe: Principal | ICD-10-CM | POA: Diagnosis present

## 2013-08-10 DIAGNOSIS — R51 Headache: Secondary | ICD-10-CM

## 2013-08-10 DIAGNOSIS — C719 Malignant neoplasm of brain, unspecified: Secondary | ICD-10-CM | POA: Diagnosis present

## 2013-08-10 DIAGNOSIS — Z85850 Personal history of malignant neoplasm of thyroid: Secondary | ICD-10-CM | POA: Diagnosis present

## 2013-08-10 DIAGNOSIS — F329 Major depressive disorder, single episode, unspecified: Secondary | ICD-10-CM | POA: Diagnosis present

## 2013-08-10 DIAGNOSIS — M129 Arthropathy, unspecified: Secondary | ICD-10-CM | POA: Diagnosis present

## 2013-08-10 DIAGNOSIS — F411 Generalized anxiety disorder: Secondary | ICD-10-CM | POA: Diagnosis present

## 2013-08-10 DIAGNOSIS — M810 Age-related osteoporosis without current pathological fracture: Secondary | ICD-10-CM | POA: Diagnosis present

## 2013-08-10 DIAGNOSIS — Z7902 Long term (current) use of antithrombotics/antiplatelets: Secondary | ICD-10-CM

## 2013-08-10 DIAGNOSIS — C73 Malignant neoplasm of thyroid gland: Secondary | ICD-10-CM

## 2013-08-10 DIAGNOSIS — G9389 Other specified disorders of brain: Secondary | ICD-10-CM

## 2013-08-10 DIAGNOSIS — Z8673 Personal history of transient ischemic attack (TIA), and cerebral infarction without residual deficits: Secondary | ICD-10-CM | POA: Diagnosis present

## 2013-08-10 DIAGNOSIS — D496 Neoplasm of unspecified behavior of brain: Secondary | ICD-10-CM

## 2013-08-10 DIAGNOSIS — G8190 Hemiplegia, unspecified affecting unspecified side: Secondary | ICD-10-CM | POA: Diagnosis present

## 2013-08-10 DIAGNOSIS — E785 Hyperlipidemia, unspecified: Secondary | ICD-10-CM | POA: Diagnosis present

## 2013-08-10 DIAGNOSIS — I10 Essential (primary) hypertension: Secondary | ICD-10-CM | POA: Diagnosis present

## 2013-08-10 DIAGNOSIS — Z79899 Other long term (current) drug therapy: Secondary | ICD-10-CM

## 2013-08-10 DIAGNOSIS — F3289 Other specified depressive episodes: Secondary | ICD-10-CM | POA: Diagnosis present

## 2013-08-10 DIAGNOSIS — G936 Cerebral edema: Secondary | ICD-10-CM | POA: Diagnosis present

## 2013-08-10 DIAGNOSIS — K219 Gastro-esophageal reflux disease without esophagitis: Secondary | ICD-10-CM | POA: Diagnosis present

## 2013-08-10 DIAGNOSIS — Z9181 History of falling: Secondary | ICD-10-CM

## 2013-08-10 LAB — CBC WITH DIFFERENTIAL/PLATELET
Basophils Absolute: 0 10*3/uL (ref 0.0–0.1)
Basophils Relative: 1 % (ref 0–1)
Eosinophils Absolute: 0.3 10*3/uL (ref 0.0–0.7)
HCT: 37 % (ref 36.0–46.0)
Lymphocytes Relative: 22 % (ref 12–46)
Lymphs Abs: 1.6 10*3/uL (ref 0.7–4.0)
MCHC: 32.7 g/dL (ref 30.0–36.0)
Monocytes Absolute: 0.6 10*3/uL (ref 0.1–1.0)
Neutro Abs: 4.8 10*3/uL (ref 1.7–7.7)
Neutrophils Relative %: 65 % (ref 43–77)
Platelets: 224 10*3/uL (ref 150–400)
RBC: 3.9 MIL/uL (ref 3.87–5.11)
RDW: 14.4 % (ref 11.5–15.5)
WBC: 7.3 10*3/uL (ref 4.0–10.5)

## 2013-08-10 LAB — URINALYSIS, ROUTINE W REFLEX MICROSCOPIC
Bilirubin Urine: NEGATIVE
Nitrite: NEGATIVE
Protein, ur: NEGATIVE mg/dL
Specific Gravity, Urine: 1.007 (ref 1.005–1.030)
Urobilinogen, UA: 0.2 mg/dL (ref 0.0–1.0)

## 2013-08-10 LAB — BASIC METABOLIC PANEL
BUN: 17 mg/dL (ref 6–23)
CO2: 26 mEq/L (ref 19–32)
Calcium: 9 mg/dL (ref 8.4–10.5)
GFR calc Af Amer: 44 mL/min — ABNORMAL LOW (ref >90)
GFR calc non Af Amer: 38 mL/min — ABNORMAL LOW (ref >90)
Sodium: 137 mEq/L (ref 135–145)

## 2013-08-10 LAB — PROTIME-INR
INR: 1.07 (ref 0.00–1.49)
Prothrombin Time: 13.7 seconds (ref 11.6–15.2)

## 2013-08-10 MED ORDER — DEXAMETHASONE SODIUM PHOSPHATE 10 MG/ML IJ SOLN
10.0000 mg | Freq: Once | INTRAMUSCULAR | Status: AC
  Administered 2013-08-10: 10 mg via INTRAVENOUS
  Filled 2013-08-10: qty 1

## 2013-08-10 MED ORDER — SODIUM CHLORIDE 0.9 % IV SOLN
1000.0000 mg | Freq: Once | INTRAVENOUS | Status: DC
  Filled 2013-08-10: qty 10

## 2013-08-10 NOTE — Progress Notes (Signed)
Report received from Michael, RN

## 2013-08-10 NOTE — ED Notes (Signed)
Per EMS- Pt comes from home with increasing confusion x several days. Has has intermittent episodes of left sided weakness has hx of TIA. Also, urinary frequency and foul smelling odor. EMS called out twice today, first by family who then refused transport. Secondly by home health. EMS negative stroke screen. BP 140/58, HR 70 SR, RR 18, CBG 93. 20 gauge to Left AC.

## 2013-08-10 NOTE — Consult Note (Signed)
Neurology Consultation Reason for Consult: Brain tumor Referring Physician: Adela Glimpse, A  CC: Left sided weakness  History is obtained from:patient, daughters  HPI: [name] is a 79 y.o. female with a history of increasing confusion and weakness over the past few months who was found in the ED to have a large right frontal mass. She has been making errors with her checking account and acting confused as well. She presented today after a home health nurse checked on her and found her to be weak on the left and advised her to come to the ED.  No history of seizures.    ROS: A 14 point ROS was performed and is negative except as noted in the HPI.  Past Medical History  Diagnosis Date  . CVA 1998    "mini" cva, no residual deficts  . ARTHRITIS   . URINARY INCONTINENCE   . DEPRESSION   . DDD (degenerative disc disease)     Cervical spine & knees  . GERD   . DYSLIPIDEMIA   . HYPERTENSION   . HYPOTHYROIDISM   . PEPTIC ULCER DISEASE 11/2009  . CARCINOMA, THYROID GLAND, PAPILLARY 09/2008    s/p total thyroidectomy  . Osteoporosis     Family History: Cancer  Social History: Tob: never smoker  Exam: Current vital signs: BP 133/53  Pulse 66  Temp(Src) 99 F (37.2 C) (Oral)  Resp 18  Ht 5\' 4"  (1.626 m)  SpO2 97% Vital signs in last 24 hours: Temp:  [99 F (37.2 C)] 99 F (37.2 C) (12/15 1811) Pulse Rate:  [64-70] 66 (12/15 1930) Resp:  [16-20] 18 (12/15 1930) BP: (115-133)/(50-72) 133/53 mmHg (12/15 1930) SpO2:  [93 %-97 %] 97 % (12/15 1930)  General: in bed, NAD CV: RRR Mental Status: Patient is awake, alert, oriented to person, place, month, year, and situation. Immediate and remote memory are intact. Patient is able to spell world backwards.  No signs of aphasia or neglect Unable to perform luria hand-sequence on the left, intact on the right.  Cranial Nerves: II: Visual Fields are full. Pupils are equal, round, and reactive to light.  Discs are  difficult to visualize. III,IV, VI: EOMI without ptosis or diploplia.  V: Facial sensation is symmetric to temperature VII: Facial movement is slightly weak on the right.  VIII: hearing is intact to voice X: Uvula elevates symmetrically XI: Shoulder shrug is symmetric. XII: tongue is midline without atrophy or fasciculations.  Motor: Tone is normal. Bulk is normal. 5/5 strength was present on the right. 4+/5 on the left arm, 4/5 in left leg. Sensory: Sensation is symmetric to light touch and temperature in the arms and legs.  Deep Tendon Reflexes: 2+ and symmetric in the biceps and patellae.  Cerebellar: FNF  intact bilaterally Gait: Not tested 2/2 weakness.    I have reviewed labs in epic and the results pertinent to this consultation are: Cr 1.27 CBC unremarkable, no leukocytosis.   I have reviewed the images obtained:MRI brain solitary right frontal mass.   Impression: 79 yo F with right frontal brain mass. Likely primary malignancy given that it appears to be crossing the anterior commisure, but would be worthwhile to rule out widely metastatic disease with a CT c/a/p. If no clear metastatic disease, then would likely need biopsy of the lesion.   Recommendations: 1) CT C/A/P 2) If no clear metastatic disease, would consider neurosurgical consult for biopsy.  3) Decadron, already given 10mg , continue with 4mg  Q6H   Ritta Slot,  MD [facility] [phone]  If 7pm- 7am, please page neurology on call at [phone].

## 2013-08-10 NOTE — ED Notes (Signed)
Neuro MD at bedside

## 2013-08-10 NOTE — Telephone Encounter (Signed)
Ok, noted Thanks for the message

## 2013-08-10 NOTE — Telephone Encounter (Signed)
Nurse called and states she is there with pt. Since md saw her last she has had several falls. C/o of (L) side weakness. Her 02 is 94%. Daughter is going to take mom to ER just wanted to inform md.../lmb

## 2013-08-10 NOTE — ED Provider Notes (Addendum)
CSN: [id number]     Arrival date & time 08/10/13  1804 History   First MD Initiated Contact with Patient 08/10/13 1812     Chief Complaint  Patient presents with  . Altered Mental Status   HPI  Patient presents with weakness, and altered mentation over the last several days. Patient states that she fell this morning. Apparently she has  intermittent episodes of weakness on her left side. After family arives, they note that this may be for over one month's time.  Fell this morning. The paramedics were called to the house and help the patient back in the bed, but apparently the patient and family declined transport. She does have visiting home health care nursing and they were out to the house today. It is felt that she was still having difficulty, although,  I am not sure if this was with weakness or not. She was transported here. There is report from paramedics to our nursing staff that her urine has been foul smelling and she's been having dysuria. She can reiterate this. She states that she has fallen 5 times in the last 5 days. Complains of a mild headache, and difficulty walking.  Also complains of insomnia, chronically.  Past Medical History  Diagnosis Date  . CVA 1998    "mini" cva, no residual deficts  . ARTHRITIS   . URINARY INCONTINENCE   . DEPRESSION   . DDD (degenerative disc disease)     Cervical spine & knees  . GERD   . DYSLIPIDEMIA   . HYPERTENSION   . HYPOTHYROIDISM   . PEPTIC ULCER DISEASE 11/2009  . CARCINOMA, THYROID GLAND, PAPILLARY 09/2008    s/p total thyroidectomy  . Osteoporosis    Past Surgical History  Procedure Laterality Date  . Cholecystectomy    . Abdominal hysterectomy    . Thyroidectomy  2/30/10    papillary, s/p total  . Breast biopsy    . Back surgery    . Tonsillectomy    . Appendectomy    . Incontinence surgery    . Wrist surgery      LEFT  . Cardiac catheterization  03/30/2005    NORMAL EF. NORMAL CORONARIES  . Cardiovascular stress test   01/27/2003    NO EVIDENCE OF ISCHEMIA. EF 74%  . US echocardiography  03/16/2009    EF 55-60%  . US echocardiography  01/26/2003    EF 60-65%   Family History  Problem Relation Age of Onset  . Arthritis Mother   . Diabetes Mother   . Hyperlipidemia Mother   . Arthritis Father   . Diabetes Father   . Hyperlipidemia Father   . Breast cancer Maternal Grandmother   . Cancer Sister     breast   History  Substance Use Topics  . Smoking status: Never Smoker   . Smokeless tobacco: Not on file     Comment: widowed, lives with daughter "who is an addict" per pt. Retired, enjoying gardening  . Alcohol Use: No   OB History   Grav Para Term Preterm Abortions TAB SAB Ect Mult Living                 Review of Systems  Constitutional: Negative for fever, chills, diaphoresis, appetite change, fatigue and unexpected weight change.       Insomnia  HENT: Negative for mouth sores, sore throat and trouble swallowing.   Eyes: Negative for visual disturbance.  Respiratory: Negative for cough, chest tightness, shortness of breath and  wheezing.   Cardiovascular: Negative for chest pain.  Gastrointestinal: Negative for nausea, vomiting, abdominal pain, diarrhea and abdominal distention.  Endocrine: Negative for polydipsia, polyphagia and polyuria.  Genitourinary: Positive for dysuria. Negative for frequency and hematuria.  Musculoskeletal: Negative for gait problem.  Skin: Negative for color change, pallor and rash.  Neurological: Positive for weakness and headaches. Negative for dizziness, syncope and light-headedness.  Hematological: Does not bruise/bleed easily.  Psychiatric/Behavioral: Negative for behavioral problems and confusion.    Allergies  Iodine  Home Medications   Current Outpatient Rx  Name  Route  Sig  Dispense  Refill  . ALPRAZolam (XANAX) 0.25 MG tablet   Oral   Take 1 tablet (0.25 mg total) by mouth 3 (three) times daily as needed for anxiety.   90 tablet   1   .  amitriptyline (ELAVIL) 25 MG tablet   Oral   Take 1 tablet (25 mg total) by mouth at bedtime.   30 tablet   5   . aspirin 81 MG tablet   Oral   Take 81 mg by mouth daily.           . clopidogrel (PLAVIX) 75 MG tablet   Oral   Take 1 tablet (75 mg total) by mouth daily with breakfast.   30 tablet   11   . fluocinonide-emollient (LIDEX-E) 0.05 % cream   Topical   Apply 1 application topically 2 (two) times a week.          . indomethacin (INDOCIN) 25 MG capsule   Oral   Take 25 mg by mouth daily as needed for moderate pain.         Marland Kitchen levothyroxine (SYNTHROID, LEVOTHROID) 100 MCG tablet   Oral   Take 1 tablet (100 mcg total) by mouth daily.   30 tablet   11   . losartan (COZAAR) 50 MG tablet   Oral   Take 1 tablet (50 mg total) by mouth daily.   30 tablet   11   . methocarbamol (ROBAXIN) 500 MG tablet   Oral   Take 500-1,000 mg by mouth every 8 (eight) hours as needed for muscle spasms.         . pravastatin (PRAVACHOL) 40 MG tablet   Oral   Take 40 mg by mouth daily.         . promethazine (PHENERGAN) 25 MG tablet   Oral   Take 25 mg by mouth every 8 (eight) hours as needed for nausea or vomiting.         . traMADol-acetaminophen (ULTRACET) 37.5-325 MG per tablet   Oral   Take 1 tablet by mouth 2 (two) times daily as needed for moderate pain.         . pantoprazole (PROTONIX) 40 MG tablet   Oral   Take 40 mg by mouth 2 (two) times daily.            BP 133/53  Pulse 66  Temp(Src) 99 F (37.2 C) (Oral)  Resp 18  Ht 5\' 4"  (1.626 m)  SpO2 97% Physical Exam  Constitutional: She is oriented to person, place, and time. She appears well-developed and well-nourished. No distress.  HENT:  Head: Normocephalic.  Eyes: Conjunctivae are normal. Pupils are equal, round, and reactive to light. No scleral icterus.  Neck: Normal range of motion. Neck supple. No thyromegaly present.  Cardiovascular: Normal rate and regular rhythm.  Exam reveals no  gallop and no friction rub.   No murmur  heard. Pulmonary/Chest: Effort normal and breath sounds normal. No respiratory distress. She has no wheezes. She has no rales.  Abdominal: Soft. Bowel sounds are normal. She exhibits no distension. There is no tenderness. There is no rebound.  Musculoskeletal: Normal range of motion.  Neurological: She is alert and oriented to person, place, and time. No cranial nerve deficit. GCS eye subscore is 4. GCS verbal subscore is 5. GCS motor subscore is 6.  She is slow to respond verbally, but does not have aphasia, she is appropriate. She has subtle left facial, left arm, and left leg weakness.  Skin: Skin is warm and dry. No rash noted.  Psychiatric: She has a normal mood and affect. Her behavior is normal.    ED Course  Procedures (including critical care time) Labs Review Labs Reviewed  BASIC METABOLIC PANEL - Abnormal; Notable for the following:    Creatinine, Ser 1.27 (*)    GFR calc non Af Amer 38 (*)    GFR calc Af Amer 44 (*)    All other components within normal limits  URINE CULTURE  CBC WITH DIFFERENTIAL  PROTIME-INR  URINALYSIS, ROUTINE W REFLEX MICROSCOPIC   Imaging Review Ct Head Wo Contrast  08/10/2013   CLINICAL DATA:  Dizziness and headache  EXAM: CT HEAD WITHOUT CONTRAST  TECHNIQUE: Contiguous axial images were obtained from the base of the skull through the vertex without intravenous contrast. Study was performed within 24 hr of patient's arrival at the emergency department.  COMPARISON:  June 30, 2008  FINDINGS: There is widespread vasogenic edema throughout the right frontal lobe extending into the anterior right parietal lobe. Within the right frontal lobe, there is a masslike area measuring 4.0 by 3.3 cm. There is marked effacement of the lateral ventricles with midline shift toward the left by approximately 16 mm.  There is no hemorrhage. There is no subdural or epidural fluid. Bony calvarium appears intact. The mastoid air  cells are clear.  IMPRESSION: Findings felt to represent mass in the right frontal lobe with extensive surrounding vasogenic edema which effaces the lateral ventricles and causes midline shift toward the left. Neoplasm is the most likely etiology for this finding, although abscess is a differential consideration on this study. Given these findings, contrast enhanced CT or pre and post-contrast MR to further assess would be warranted.  Critical Value/emergent results were called by telephone at the time of interpretation on 08/10/2013 at 8:22 PM to Dr. Rolland Porter , who verbally acknowledged these results.   Electronically Signed   By: Bretta Bang M.D.   On: 08/10/2013 20:23    EKG Interpretation   None       MDM   1. Brain tumor      CT scan shows right large frontal tumor with vasogenic edema, and midline shift. MRIs been requested. She been given IV Decadron. I have loaded her with IV Keppra. Case discussed with Dr. Amada Jupiter of Neurology who will see the patient.  Also discussed with Hospitalist service. Pt en route to MRI.    Roney Marion, MD 08/10/13 2046  Roney Marion, MD 08/10/13 1610  Roney Marion, MD 08/10/13 2230

## 2013-08-10 NOTE — H&P (Signed)
PCP:  [name], MD    Chief Complaint:  Headache and left leg weakness  HPI: [name] is a 79 y.o. female   has a past medical history of CVA (1998); ARTHRITIS; URINARY INCONTINENCE; DEPRESSION; DDD (degenerative disc disease); GERD; DYSLIPIDEMIA; HYPERTENSION; HYPOTHYROIDISM; PEPTIC ULCER DISEASE (11/2009); CARCINOMA, THYROID GLAND, PAPILLARY (09/2008); and Osteoporosis.   Presented with  Progressive left sided weakness over the past 4-5 weeks since October. She has had some frontal headaches as well. Denies any weight loss. She started to have frequent falls and had one today. Home health nurse noted left side weakness and asked her to be evaluated in ER. In ER CT scan was done that showed Right frontal lobe mass. Neurology has been consulted. Patient started on steroids. MRI confirmed large mass with shift.  Hospitalist to admit. She have had colonoscopy and mammograms done up to date.  Patient have had some issues with judgment. She has been bouncing checks. She has been says she is tired and have been taking some naps.   Review of Systems:    Pertinent positives include: left side weakness,  gait abnormality, slurred speech,   confusion  Constitutional:  No weight loss, night sweats, Fevers, chills, fatigue, weight loss  HEENT:  No headaches, Difficulty swallowing,Tooth/dental problems,Sore throat,  No sneezing, itching, ear ache, nasal congestion, post nasal drip,  Cardio-vascular:  No chest pain, Orthopnea, PND, anasarca, dizziness, palpitations.no Bilateral lower extremity swelling  GI:  No heartburn, indigestion, abdominal pain, nausea, vomiting, diarrhea, change in bowel habits, loss of appetite, melena, blood in stool, hematemesis Resp:  no shortness of breath at rest. No dyspnea on exertion, No excess mucus, no productive cough, No non-productive cough, No coughing up of blood.No change in color of mucus.No wheezing. Skin:  no rash or lesions. No  jaundice GU:  no dysuria, change in color of urine, no urgency or frequency. No straining to urinate.  No flank pain.  Musculoskeletal:  No joint pain or no joint swelling. No decreased range of motion. No back pain.  Psych:  No change in mood or affect. No depression or anxiety. No memory loss.  Neuro: no localizing neurological complaints, no tingling,   no double vision, no, no   Otherwise ROS are negative except for above, 10 systems were reviewed  Past Medical History: Past Medical History  Diagnosis Date  . CVA 1998    "mini" cva, no residual deficts  . ARTHRITIS   . URINARY INCONTINENCE   . DEPRESSION   . DDD (degenerative disc disease)     Cervical spine & knees  . GERD   . DYSLIPIDEMIA   . HYPERTENSION   . HYPOTHYROIDISM   . PEPTIC ULCER DISEASE 11/2009  . CARCINOMA, THYROID GLAND, PAPILLARY 09/2008    s/p total thyroidectomy  . Osteoporosis    Past Surgical History  Procedure Laterality Date  . Cholecystectomy    . Abdominal hysterectomy    . Thyroidectomy  2/30/10    papillary, s/p total  . Breast biopsy    . Back surgery    . Tonsillectomy    . Appendectomy    . Incontinence surgery    . Wrist surgery      LEFT  . Cardiac catheterization  03/30/2005    NORMAL EF. NORMAL CORONARIES  . Cardiovascular stress test  01/27/2003    NO EVIDENCE OF ISCHEMIA. EF 74%  . US echocardiography  03/16/2009    EF 55-60%  . US echocardiography  01/26/2003  EF 60-65%     Medications: Prior to Admission medications   Medication Sig Start Date End Date Taking? Authorizing Provider  ALPRAZolam (XANAX) 0.25 MG tablet Take 1 tablet (0.25 mg total) by mouth 3 (three) times daily as needed for anxiety. 08/03/13  Yes Newt Lukes, MD  amitriptyline (ELAVIL) 25 MG tablet Take 1 tablet (25 mg total) by mouth at bedtime. 08/03/13  Yes Newt Lukes, MD  aspirin 81 MG tablet Take 81 mg by mouth daily.     Yes Historical Provider, MD  clopidogrel (PLAVIX) 75 MG tablet Take  1 tablet (75 mg total) by mouth daily with breakfast. 08/03/13  Yes Newt Lukes, MD  fluocinonide-emollient (LIDEX-E) 0.05 % cream Apply 1 application topically 2 (two) times a week.  02/14/11  Yes Newt Lukes, MD  indomethacin (INDOCIN) 25 MG capsule Take 25 mg by mouth daily as needed for moderate pain.   Yes Historical Provider, MD  levothyroxine (SYNTHROID, LEVOTHROID) 100 MCG tablet Take 1 tablet (100 mcg total) by mouth daily. 08/03/13  Yes Newt Lukes, MD  losartan (COZAAR) 50 MG tablet Take 1 tablet (50 mg total) by mouth daily. 08/03/13  Yes Newt Lukes, MD  methocarbamol (ROBAXIN) 500 MG tablet Take 500-1,000 mg by mouth every 8 (eight) hours as needed for muscle spasms.   Yes Historical Provider, MD  pravastatin (PRAVACHOL) 40 MG tablet Take 40 mg by mouth daily.   Yes Historical Provider, MD  promethazine (PHENERGAN) 25 MG tablet Take 25 mg by mouth every 8 (eight) hours as needed for nausea or vomiting.   Yes Historical Provider, MD  traMADol-acetaminophen (ULTRACET) 37.5-325 MG per tablet Take 1 tablet by mouth 2 (two) times daily as needed for moderate pain.   Yes Historical Provider, MD  pantoprazole (PROTONIX) 40 MG tablet Take 40 mg by mouth 2 (two) times daily.      Historical Provider, MD    Allergies:   Allergies  Allergen Reactions  . Iodine     REACTION: Nausea    Social History:  Ambulatory   independently   Lives at   home   reports that she has never smoked. She does not have any smokeless tobacco history on file. She reports that she does not drink alcohol or use illicit drugs.   Family History: family history includes Arthritis in her father and mother; Breast cancer in her maternal grandmother; Cancer in her sister; Diabetes in her father and mother; Hyperlipidemia in her father and mother.    Physical Exam: Patient Vitals for the past 24 hrs:  BP Temp Temp src Pulse Resp SpO2 Height  08/10/13 1930 133/53 mmHg - - 66 18 97 % -   08/10/13 1915 115/57 mmHg - - 64 16 94 % -  08/10/13 1900 128/50 mmHg - - 64 17 94 % -  08/10/13 1845 128/54 mmHg - - 64 17 94 % -  08/10/13 1830 115/72 mmHg - - 66 20 93 % -  08/10/13 1812 - - - - - - 5\' 4"  (1.626 m)  08/10/13 1811 129/65 mmHg 99 F (37.2 C) Oral 70 - 94 % -    1. General:  in No Acute distress 2. Psychological: Alert and Oriented 3. Head/ENT:   Moist   Mucous Membranes                          Head Non traumatic, neck supple  Normal   Dentition 4. SKIN: normal  Skin turgor,  Skin clean Dry and intact no rash 5. Heart: Regular rate and rhythm no Murmur, Rub or gallop 6. Lungs: Clear to auscultation bilaterally, no wheezes or crackles   7. Abdomen: Soft, non-tender, slightly distended 8. Lower extremities: no clubbing, cyanosis, or edema 9. Neurologically strength 4 out of for now 4 extremities slight nystagmus noted otherwise unremarkable 10. MSK: Normal range of motion  body mass index is unknown because there is no weight on file.   Labs on Admission:   Recent Labs  08/10/13 1929  NA 137  K 4.3  CL 103  CO2 26  GLUCOSE 84  BUN 17  CREATININE 1.27*  CALCIUM 9.0   No results found for this basename: AST, ALT, ALKPHOS, BILITOT, PROT, ALBUMIN,  in the last 72 hours No results found for this basename: LIPASE, AMYLASE,  in the last 72 hours  Recent Labs  08/10/13 1929  WBC 7.3  NEUTROABS 4.8  HGB 12.1  HCT 37.0  MCV 94.9  PLT 224   No results found for this basename: CKTOTAL, CKMB, CKMBINDEX, TROPONINI,  in the last 72 hours No results found for this basename: TSH, T4TOTAL, FREET3, T3FREE, THYROIDAB,  in the last 72 hours No results found for this basename: VITAMINB12, FOLATE, FERRITIN, TIBC, IRON, RETICCTPCT,  in the last 72 hours Lab Results  Component Value Date   HGBA1C 5.7 04/24/2013    The CrCl is unknown because both a height and weight (above a minimum accepted value) are required for this calculation. ABG No  results found for this basename: phart, pco2, po2, hco3, tco2, acidbasedef, o2sat     No results found for this basename: DDIMER     Other results:  I have pearsonaly reviewed this: ECG REPORT  Rate: 67   Rhythm:  NSR ST&T Change: no ischemic changes  UA no evidence of infection   Cultures: No results found for this basename: sdes, specrequest, cult, reptstatus       Radiological Exams on Admission: Ct Head Wo Contrast  08/10/2013   CLINICAL DATA:  Dizziness and headache  EXAM: CT HEAD WITHOUT CONTRAST  TECHNIQUE: Contiguous axial images were obtained from the base of the skull through the vertex without intravenous contrast. Study was performed within 24 hr of patient's arrival at the emergency department.  COMPARISON:  June 30, 2008  FINDINGS: There is widespread vasogenic edema throughout the right frontal lobe extending into the anterior right parietal lobe. Within the right frontal lobe, there is a masslike area measuring 4.0 by 3.3 cm. There is marked effacement of the lateral ventricles with midline shift toward the left by approximately 16 mm.  There is no hemorrhage. There is no subdural or epidural fluid. Bony calvarium appears intact. The mastoid air cells are clear.  IMPRESSION: Findings felt to represent mass in the right frontal lobe with extensive surrounding vasogenic edema which effaces the lateral ventricles and causes midline shift toward the left. Neoplasm is the most likely etiology for this finding, although abscess is a differential consideration on this study. Given these findings, contrast enhanced CT or pre and post-contrast MR to further assess would be warranted.  Critical Value/emergent results were called by telephone at the time of interpretation on 08/10/2013 at 8:22 PM to Dr. Rolland Porter , who verbally acknowledged these results.   Electronically Signed   By: Bretta Bang M.D.   On: 08/10/2013 20:23   Mr Brain Wo Contrast  08/10/2013  CLINICAL  DATA:  Abnormal CT, mass.  EXAM: MRI HEAD WITHOUT CONTRAST  TECHNIQUE: Multiplanar, multiecho pulse sequences of the brain and surrounding structures were obtained without intravenous contrast. Please note, due to a low GFR of 39, no contrast was administered.  COMPARISON:  CT of the head August 10, 2012 at 2012 hr. MRI of the brain May 18, 2005  FINDINGS: Heterogeneous right frontal lobe 3.9 x 4.5 x 4.1 cm intraparenchymal mass. Associated reduced diffusion, with the lower signal superior component, the T2 bright more cystic component inferiorly shows less reduced diffusion, there is corresponding low ADC values. Surrounding T2 bright vasogenic edema, with local mass effect, effacing the sulci, with 13 mm of right to left subfalcine herniation. No intrinsic T1 shortening. Due to mass effect, the anterior cerebral arteries are displaced to the left. Minimal susceptibility artifact along the inferior aspect of the mass.  No hydrocephalus or ventricular entrapment. A few subcentimeter scattered supratentorial white matter T2 hyperintensities exclusive of the right frontal lobe process suggest sequela of chronic small vessel ischemic disease. Remote tiny right cerebellar lacunar infarct. No abnormal extra-axial fluid collections.  Status post bilateral ocular lens implants. Visualized paranasal sinuses and mastoid air cells appear well-aerated. No suspicious calvarial bone marrow signal. No abnormal sellar expansion. Craniocervical junction maintained.  IMPRESSION: 3.9 x 4.5 x 4.1 cm right frontal lobe intraparenchymal heterogeneous mass (the cystic component may reflect degeneration) with imaging characteristics highly concerning for hypercellular tumor such as glioblastoma multiforme, this less likely reflects abscess, or metastasis. Extensive surrounding FLAIR abnormality may reflect edema and, possibly infiltrating tumor. 1.3 cm of right to left subfalcine herniation, displacing the anterior cerebral  arteries to the left, without ACA territory infarct.  Critical Value/emergent results were called by telephone at the time of interpretation on 08/10/2013 at 11:06 PM to Dr. Tanna Furry , who verbally acknowledged these results.   Electronically Signed   By: Elon Alas   On: 08/10/2013 23:07    Chart has been reviewed  Assessment/Plan 79 year old female immune diagnosed brain mass worrisome for GBM versus metastatic disease  Present on Admission:  . Hemiplegia affecting nondominant side -  per history patient have had left leg weakness although currently it's not noted by exam. Patient will need PT evaluation  . Brain mass- Decadron 4 mg every 6 hours, neuro checks, obtain CT scan of the abdomen and chest to evaluate for any other source of primary mass if none found will need neurosurgery consult for biopsy. Holding Plavix which used to be taken for history of CVA   Prophylaxis: SCD, Protonix  CODE STATUS: FULL CODE  Other plan as per orders.  I have spent a total of 65 min on this admission time taken to discuss care of neurology and radiology   [place] 08/10/2013, 11:12 PM

## 2013-08-11 ENCOUNTER — Inpatient Hospital Stay (HOSPITAL_COMMUNITY): Payer: Medicare Other

## 2013-08-11 DIAGNOSIS — D496 Neoplasm of unspecified behavior of brain: Secondary | ICD-10-CM

## 2013-08-11 LAB — MAGNESIUM: Magnesium: 2.4 mg/dL (ref 1.5–2.5)

## 2013-08-11 LAB — PHOSPHORUS: Phosphorus: 2.8 mg/dL (ref 2.3–4.6)

## 2013-08-11 LAB — COMPREHENSIVE METABOLIC PANEL
Albumin: 3.3 g/dL — ABNORMAL LOW (ref 3.5–5.2)
BUN: 17 mg/dL (ref 6–23)
Creatinine, Ser: 1.23 mg/dL — ABNORMAL HIGH (ref 0.50–1.10)
GFR calc Af Amer: 46 mL/min — ABNORMAL LOW (ref >90)
Total Protein: 6.7 g/dL (ref 6.0–8.3)

## 2013-08-11 LAB — CBC
HCT: 39.1 % (ref 36.0–46.0)
MCV: 95.1 fL (ref 78.0–100.0)
Platelets: 225 10*3/uL (ref 150–400)
RBC: 4.11 MIL/uL (ref 3.87–5.11)
WBC: 7.6 10*3/uL (ref 4.0–10.5)

## 2013-08-11 MED ORDER — DOCUSATE SODIUM 100 MG PO CAPS
100.0000 mg | ORAL_CAPSULE | Freq: Two times a day (BID) | ORAL | Status: DC
  Administered 2013-08-11 – 2013-08-19 (×15): 100 mg via ORAL
  Filled 2013-08-11 (×14): qty 1

## 2013-08-11 MED ORDER — ACETAMINOPHEN 325 MG PO TABS
650.0000 mg | ORAL_TABLET | Freq: Four times a day (QID) | ORAL | Status: DC | PRN
  Administered 2013-08-13 – 2013-08-16 (×2): 650 mg via ORAL
  Filled 2013-08-11 (×3): qty 2

## 2013-08-11 MED ORDER — ONDANSETRON HCL 4 MG/2ML IJ SOLN: 4.0000 mg | Freq: Four times a day (QID) | INTRAMUSCULAR | Status: DC | PRN

## 2013-08-11 MED ORDER — ASPIRIN EC 81 MG PO TBEC
81.0000 mg | DELAYED_RELEASE_TABLET | Freq: Every day | ORAL | Status: DC
  Filled 2013-08-11: qty 1

## 2013-08-11 MED ORDER — ALPRAZOLAM 0.25 MG PO TABS
0.2500 mg | ORAL_TABLET | Freq: Three times a day (TID) | ORAL | Status: DC | PRN
  Administered 2013-08-12 – 2013-08-15 (×4): 0.25 mg via ORAL
  Filled 2013-08-11 (×4): qty 1

## 2013-08-11 MED ORDER — ASPIRIN 81 MG PO TABS: 81.0000 mg | ORAL_TABLET | Freq: Every day | ORAL | Status: DC

## 2013-08-11 MED ORDER — ACETAMINOPHEN 650 MG RE SUPP
650.0000 mg | Freq: Four times a day (QID) | RECTAL | Status: DC | PRN
  Administered 2013-08-11: 650 mg via RECTAL
  Filled 2013-08-11: qty 1

## 2013-08-11 MED ORDER — SODIUM CHLORIDE 0.9 % IV SOLN
INTRAVENOUS | Status: AC
  Administered 2013-08-11: 01:00:00 via INTRAVENOUS

## 2013-08-11 MED ORDER — LEVOTHYROXINE SODIUM 100 MCG PO TABS
100.0000 ug | ORAL_TABLET | Freq: Every day | ORAL | Status: DC
  Administered 2013-08-12 – 2013-08-19 (×7): 100 ug via ORAL
  Filled 2013-08-11 (×12): qty 1

## 2013-08-11 MED ORDER — ONDANSETRON HCL 4 MG PO TABS: 4.0000 mg | ORAL_TABLET | Freq: Four times a day (QID) | ORAL | Status: DC | PRN

## 2013-08-11 MED ORDER — AMITRIPTYLINE HCL 25 MG PO TABS
25.0000 mg | ORAL_TABLET | Freq: Every day | ORAL | Status: DC
  Administered 2013-08-11 – 2013-08-18 (×8): 25 mg via ORAL
  Filled 2013-08-11 (×14): qty 1

## 2013-08-11 MED ORDER — SODIUM CHLORIDE 0.9 % IJ SOLN
3.0000 mL | Freq: Two times a day (BID) | INTRAMUSCULAR | Status: DC
  Administered 2013-08-11 – 2013-08-18 (×12): 3 mL via INTRAVENOUS

## 2013-08-11 MED ORDER — SIMVASTATIN 20 MG PO TABS
20.0000 mg | ORAL_TABLET | Freq: Every day | ORAL | Status: DC
  Administered 2013-08-11 – 2013-08-18 (×8): 20 mg via ORAL
  Filled 2013-08-11 (×10): qty 1

## 2013-08-11 MED ORDER — METHOCARBAMOL 500 MG PO TABS
500.0000 mg | ORAL_TABLET | Freq: Three times a day (TID) | ORAL | Status: DC | PRN
  Filled 2013-08-11: qty 2

## 2013-08-11 MED ORDER — LOSARTAN POTASSIUM 50 MG PO TABS
50.0000 mg | ORAL_TABLET | Freq: Every day | ORAL | Status: DC
  Administered 2013-08-12 – 2013-08-18 (×6): 50 mg via ORAL
  Filled 2013-08-11 (×9): qty 1

## 2013-08-11 MED ORDER — HYDROCODONE-ACETAMINOPHEN 5-325 MG PO TABS
1.0000 | ORAL_TABLET | ORAL | Status: DC | PRN
  Administered 2013-08-13 (×2): 1 via ORAL
  Administered 2013-08-14 – 2013-08-18 (×7): 2 via ORAL
  Filled 2013-08-11: qty 1
  Filled 2013-08-11 (×6): qty 2
  Filled 2013-08-11: qty 1
  Filled 2013-08-11: qty 2

## 2013-08-11 MED ORDER — PANTOPRAZOLE SODIUM 40 MG PO TBEC
40.0000 mg | DELAYED_RELEASE_TABLET | Freq: Two times a day (BID) | ORAL | Status: DC
  Administered 2013-08-11 – 2013-08-12 (×3): 40 mg via ORAL
  Filled 2013-08-11 (×4): qty 1

## 2013-08-11 MED ORDER — GADOBENATE DIMEGLUMINE 529 MG/ML IV SOLN
10.0000 mL | Freq: Once | INTRAVENOUS | Status: AC
  Administered 2013-08-11: 8 mL via INTRAVENOUS

## 2013-08-11 MED ORDER — DEXAMETHASONE SODIUM PHOSPHATE 10 MG/ML IJ SOLN
4.0000 mg | Freq: Four times a day (QID) | INTRAMUSCULAR | Status: DC
  Administered 2013-08-11 – 2013-08-17 (×26): 4 mg via INTRAVENOUS
  Filled 2013-08-11 (×2): qty 0.4
  Filled 2013-08-11: qty 1
  Filled 2013-08-11 (×19): qty 0.4
  Filled 2013-08-11 (×2): qty 1
  Filled 2013-08-11 (×9): qty 0.4

## 2013-08-11 NOTE — Progress Notes (Signed)
   CARE MANAGEMENT NOTE 08/11/2013  Patient:  [name], [name]   Account Number:  [id number]  Date Initiated:  08/11/2013  Documentation initiated by:  Jiles Crocker  Subjective/Objective Assessment:   ADMITTED WITH RT FRONTAL MASS     Action/Plan:   CM FOLLOWING FOR DCP   Anticipated DC Date:  08/17/2013   Anticipated DC Plan:  POSSIBLY HOME W HOME HEALTH SERVICES; AWAITING FOR PT/OT EVALS FOR DISPOSITION NEEDS     DC Planning Services  CM consult          Status of service:  In process, will continue to follow Medicare Important Message given?  NA - LOS <3 / Initial given by admissions (If response is "NO", the following Medicare IM given date fields will be blank)  Per UR Regulation:  Reviewed for med. necessity/level of care/duration of stay  Comments:  12/16/2014Abelino Derrick RN,BSN,MHA [phone]

## 2013-08-11 NOTE — Progress Notes (Addendum)
CC:  Chief Complaint  Patient presents with  . Altered Mental Status    HPI: [name] is a 79 y.o. female admitted for progressive weakness of the left side and recent falls. She states she last felt normal some time this summer. Since then, she has been having right-sided HA, and worsening weakness on the left side. She also reports having several falls recently. She denies visual changes, or any numbness or tingling. She also denies any SZ.   She has a history of thyroid CA s/p thyroidectomy which she states was 3 months ago, however per the EMR was in 2010. She states she had to have "radiation pills" afterward, but denies any recurrence of this CA.  PMH: Past Medical History  Diagnosis Date  . CVA 1998    "mini" cva, no residual deficts  . ARTHRITIS   . URINARY INCONTINENCE   . DEPRESSION   . DDD (degenerative disc disease)     Cervical spine & knees  . GERD   . DYSLIPIDEMIA   . HYPERTENSION   . HYPOTHYROIDISM   . PEPTIC ULCER DISEASE 11/2009  . CARCINOMA, THYROID GLAND, PAPILLARY 09/2008    s/p total thyroidectomy  . Osteoporosis     PSH: Past Surgical History  Procedure Laterality Date  . Cholecystectomy    . Abdominal hysterectomy    . Thyroidectomy  2/30/10    papillary, s/p total  . Breast biopsy    . Back surgery    . Tonsillectomy    . Appendectomy    . Incontinence surgery    . Wrist surgery      LEFT  . Cardiac catheterization  03/30/2005    NORMAL EF. NORMAL CORONARIES  . Cardiovascular stress test  01/27/2003    NO EVIDENCE OF ISCHEMIA. EF 74%  . US echocardiography  03/16/2009    EF 55-60%  . US echocardiography  01/26/2003    EF 60-65%    SH: History  Substance Use Topics  . Smoking status: Never Smoker   . Smokeless tobacco: Not on file     Comment: widowed, lives with daughter "who is an addict" per pt. Retired, enjoying gardening  . Alcohol Use: No    MEDS: Prior to Admission medications   Medication Sig Start Date End Date  Taking? Authorizing Provider  ALPRAZolam (XANAX) 0.25 MG tablet Take 1 tablet (0.25 mg total) by mouth 3 (three) times daily as needed for anxiety. 08/03/13  Yes Newt Lukes, MD  amitriptyline (ELAVIL) 25 MG tablet Take 1 tablet (25 mg total) by mouth at bedtime. 08/03/13  Yes Newt Lukes, MD  aspirin 81 MG tablet Take 81 mg by mouth daily.     Yes Historical Provider, MD  clopidogrel (PLAVIX) 75 MG tablet Take 1 tablet (75 mg total) by mouth daily with breakfast. 08/03/13  Yes Newt Lukes, MD  fluocinonide-emollient (LIDEX-E) 0.05 % cream Apply 1 application topically 2 (two) times a week.  02/14/11  Yes Newt Lukes, MD  indomethacin (INDOCIN) 25 MG capsule Take 25 mg by mouth daily as needed for moderate pain.   Yes Historical Provider, MD  levothyroxine (SYNTHROID, LEVOTHROID) 100 MCG tablet Take 1 tablet (100 mcg total) by mouth daily. 08/03/13  Yes Newt Lukes, MD  losartan (COZAAR) 50 MG tablet Take 1 tablet (50 mg total) by mouth daily. 08/03/13  Yes Newt Lukes, MD  methocarbamol (ROBAXIN) 500 MG tablet Take 500-1,000 mg by mouth every 8 (eight) hours as needed  for muscle spasms.   Yes Historical Provider, MD  pravastatin (PRAVACHOL) 40 MG tablet Take 40 mg by mouth daily.   Yes Historical Provider, MD  promethazine (PHENERGAN) 25 MG tablet Take 25 mg by mouth every 8 (eight) hours as needed for nausea or vomiting.   Yes Historical Provider, MD  traMADol-acetaminophen (ULTRACET) 37.5-325 MG per tablet Take 1 tablet by mouth 2 (two) times daily as needed for moderate pain.   Yes Historical Provider, MD  pantoprazole (PROTONIX) 40 MG tablet Take 40 mg by mouth 2 (two) times daily.      Historical Provider, MD    ALLERGY: Allergies  Allergen Reactions  . Iodine     REACTION: Nausea    NEUROLOGIC EXAM: Awake, alert, oriented Able to follow two step commands ? Left-sided neglect Speech fluent, appropriate CN grossly intact Motor exam: Upper  Extremities Deltoid Bicep Tricep Grip  Right 5/5 5/5 5/5 5/5  Left 4/5 4/5 4/5 4/5   Lower Extremity IP Quad PF DF EHL  Right 5/5 5/5 5/5 5/5 5/5  Left 4/5 4/5 4/5 4/5 4/5   Sensation grossly intact to LT  [facility]: MRI brain without contrast demonstrates large parasagittal right frontal mass with significant peri-lesional edema and R->L MLS.   CT C/A/P does not demonstrate any lesions to suggest a primary CA.  IMPRESSION: - 79 y.o. female with MS change and mild left hemiparesis with large right frontal mass with significant edema and mass effect. - Due to size, edema, and mass effect and need for diagnosis, this tumor will likely require resection - Tumor is incompletely characterized due to lack of IV Gadolinium contrast. The need for further characterization prior to resection, as well as for future f/u outweighs the risk of adverse effect from Gd due to slightly decreased GFR  PLAN: - Repeat MRI Brain w/w/o Gd with Stealth (stereotactic) protocol - Cont decadron 4mg  Q6 hrs - Will speak with pt and family tomorrow regarding MRI findings and treatment options. - Stop Aspirin 81mg

## 2013-08-11 NOTE — Evaluation (Signed)
Clinical/Bedside Swallow Evaluation Patient Details  Name: [name] [name] MRN: [id number] Date of Birth: 1928-03-10  Today's Date: 08/11/2013 Time: 8657-8469 SLP Time Calculation (min): 14 min  Past Medical History:  Past Medical History  Diagnosis Date  . CVA 1998    "mini" cva, no residual deficts  . ARTHRITIS   . URINARY INCONTINENCE   . DEPRESSION   . DDD (degenerative disc disease)     Cervical spine & knees  . GERD   . DYSLIPIDEMIA   . HYPERTENSION   . HYPOTHYROIDISM   . PEPTIC ULCER DISEASE 11/2009  . CARCINOMA, THYROID GLAND, PAPILLARY 09/2008    s/p total thyroidectomy  . Osteoporosis    Past Surgical History:  Past Surgical History  Procedure Laterality Date  . Cholecystectomy    . Abdominal hysterectomy    . Thyroidectomy  2/30/10    papillary, s/p total  . Breast biopsy    . Back surgery    . Tonsillectomy    . Appendectomy    . Incontinence surgery    . Wrist surgery      LEFT  . Cardiac catheterization  03/30/2005    NORMAL EF. NORMAL CORONARIES  . Cardiovascular stress test  01/27/2003    NO EVIDENCE OF ISCHEMIA. EF 74%  . US echocardiography  03/16/2009    EF 55-60%  . US echocardiography  01/26/2003    EF 60-65%   HPI:  [name] [name] is a 79 y.o. female with a history of increasing confusion and weakness over the past few months who was found in the ED to have a large right frontal mass. She has been making errors with her checking account and acting confused as well. She presented today after a home health nurse checked on her and found her to be weak on the left and advised her to come to the ED.   Assessment / Plan / Recommendation Clinical Impression  Pt presents with normal oropharyngeal swallow with active mastication, swift and reliable swallow trigger, and no indications of airway penetration nor aspiration.  Recommend resumption of regular diet, thin liquids - no SLP f/u needed.        If pt would benefit from cognitive-linguistic  evaluation, please order.     Diet Recommendation Regular;Thin liquid   Liquid Administration via: Cup;Straw Medication Administration: Whole meds with liquid Supervision: Patient able to self feed    Other  Recommendations Oral Care Recommendations: Oral care BID   Follow Up Recommendations  None       Pertinent Vitals/Pain No pain    SLP Swallow Goals     Swallow Study Prior Functional Status  Type of Home: House Available Help at Discharge: Available 24 hours/day    General Date of Onset: 08/10/13 HPI: [name] [name] is a 79 y.o. female with a history of increasing confusion and weakness over the past few months who was found in the ED to have a large right frontal mass. She has been making errors with her checking account and acting confused as well. She presented today after a home health nurse checked on her and found her to be weak on the left and advised her to come to the ED. Type of Study: Bedside swallow evaluation Previous Swallow Assessment: none Diet Prior to this Study: NPO Temperature Spikes Noted: No Respiratory Status: Room air History of Recent Intubation: No Behavior/Cognition: Alert;Cooperative Oral Cavity - Dentition: Adequate natural dentition Self-Feeding Abilities: Able to feed self Patient Positioning: Upright in bed Baseline  Vocal Quality: Clear Volitional Cough: Strong Volitional Swallow: Able to elicit    Oral/Motor/Sensory Function Overall Oral Motor/Sensory Function: Appears within functional limits for tasks assessed   Ice Chips Ice chips: Within functional limits Presentation: Cup   Thin Liquid Thin Liquid: Within functional limits Presentation: Cup;Straw;Self Fed    Nectar Thick Nectar Thick Liquid: Not tested   Honey Thick Honey Thick Liquid: Not tested   Puree Puree: Within functional limits Presentation: Self Fed;Spoon   Solid   [name] [name] L. [place], [place] CCC/SLP Pager [phone]     Solid: Within functional  limits Presentation: Self Fed       [name] [name] [name] [name] 08/11/2013,2:54 PM

## 2013-08-11 NOTE — Progress Notes (Signed)
Daughter called and wanted pt to be XXX because she doesn't want some distant relative to know where pt is at. Talked to the pt this morning who is alert and oriented x 4 and relayed to her what her daughter wants. Pt said she doesn't need to be XXX. She want the distant relative to visit pt. Will relay information to pt oncoming nurse. Will continue to monitor pt.

## 2013-08-11 NOTE — Evaluation (Signed)
Physical Therapy Evaluation Patient Details Name: [name] MRN: [id number] DOB: 1928/07/26 Today's Date: 08/11/2013 Time: 4098-1191 PT Time Calculation (min): 33 min  PT Assessment / Plan / Recommendation History of Present Illness  HPI: [name] is a 79 y.o. female with a history of increasing confusion and weakness over the past few months who was found in the ED to have a large right frontal mass. She has been making errors with her checking account and acting confused as well. She presented today after a home health nurse checked on her and found her to be weak on the left and advised her to come to the ED.  Clinical Impression  Patient demonstrates deficits in functional mobility as indicated below. Patient will benefit from continued skilled PT to address deficits and maximize function. Will continue to see as indicated and progress activity as tolerated. If 24/ hour supervision/assist can be provided as indicated by patient, then will recommend dc home with HHPT, otherwise may need to consider SNF.    PT Assessment  Patient needs continued PT services    Follow Up Recommendations  Home health PT;Supervision/Assistance - 24 hour          Equipment Recommendations  Rolling walker with 5" wheels       Frequency Min 4X/week    Precautions / Restrictions Precautions Precautions: Fall   Pertinent Vitals/Pain Pt reporting severe headache, no numerical value given      Mobility  Bed Mobility Bed Mobility: Supine to Sit;Sitting - Scoot to Edge of Bed;Sit to Supine Supine to Sit: 4: Min assist Sitting - Scoot to [place] of Bed: 4: Min assist Details for Bed Mobility Assistance: VCs and multimodal cues Transfers Transfers: Sit to Stand;Stand to Sit Sit to Stand: 4: Min assist;3: Mod assist;From bed;From chair/3-in-1 Stand to Sit: 4: Min assist;To bed;To chair/3-in-1 Details for Transfer Assistance: Increased assist inititally, but as patient attempted  multiple transfers decrease assist needed Ambulation/Gait Ambulation/Gait Assistance: 4: Min assist;3: Mod assist Ambulation Distance (Feet): 24 Feet Assistive device: 1 person hand held assist Ambulation/Gait Assistance Details: patient with some gerneral instability and fwd lean during ambulation, may benefit from use of RW next session        PT Diagnosis: Difficulty walking;Acute pain  PT Problem List: Decreased strength;Decreased activity tolerance;Decreased balance;Decreased mobility;Decreased cognition;Decreased safety awareness;Pain PT Treatment Interventions: DME instruction;Gait training;Stair training;Functional mobility training;Therapeutic activities;Therapeutic exercise;Balance training;Patient/family education     PT Goals(Current goals can be found in the care plan section) Acute Rehab PT Goals Patient Stated Goal: to have better balance PT Goal Formulation: With patient Time For Goal Achievement: 08/25/13 Potential to Achieve Goals: Fair  Visit Information  Last PT Received On: 08/11/13 Assistance Needed: +1 Reason for Co-Treatment: Necessary to address cognition/behavior during functional activity OT goals addressed during session: ADL's and self-care Reason Eval/Treat Not Completed: Patient at procedure or test/unavailable History of Present Illness: HPI: [name] is a 79 y.o. female with a history of increasing confusion and weakness over the past few months who was found in the ED to have a large right frontal mass. She has been making errors with her checking account and acting confused as well. She presented today after a home health nurse checked on her and found her to be weak on the left and advised her to come to the ED.       Prior Functioning  Home Living Family/patient expects to be discharged to:: Private residence Living Arrangements: Children Available Help at Discharge:  Available 24 hours/day Type of Home: House Home Access: Stairs to  enter [employer] of Steps: 7 Entrance Stairs-Rails: Right;Left Home Layout: Multi-level;Bed/bath upstairs Home Equipment: Walker - 4 wheels;Walker - standard;Cane - single point Additional Comments: walk in shower with threshold, door, standard commode Prior Function Level of Independence: Independent Communication Communication: No difficulties Dominant Hand: Right    Cognition  Cognition Arousal/Alertness: Awake/alert Behavior During Therapy: Flat affect Overall Cognitive Status: Impaired/Different from baseline Area of Impairment: Safety/judgement;Awareness Memory: Decreased recall of precautions;Decreased short-term memory Safety/Judgement: Decreased awareness of deficits Awareness: Anticipatory    Extremity/Trunk Assessment Upper Extremity Assessment Upper Extremity Assessment: LUE deficits/detail LUE Sensation: decreased light touch Lower Extremity Assessment Lower Extremity Assessment: Generalized weakness;LLE deficits/detail LLE Sensation: decreased light touch   Balance Static Sitting Balance Static Sitting - Balance Support: Feet supported Static Sitting - Level of Assistance: 5: Stand by assistance Dynamic Sitting Balance Dynamic Sitting - Balance Support: Feet supported;During functional activity Dynamic Sitting - Level of Assistance: 5: Stand by assistance Dynamic Sitting - Balance Activities: Lateral lean/weight shifting;Forward lean/weight shifting;Other (comment) (performing hygiene and pericare, pulling briefs up)  End of Session PT - End of Session Equipment Utilized During Treatment: Gait belt Activity Tolerance: Patient tolerated treatment well Patient left: in bed;with call bell/phone within reach;with bed alarm set Nurse Communication: Mobility status, patient incontinent, will need frequent checks (nsg tech aware)  GP      Fabio Asa 08/11/2013, 2:01 PM Charlotte Crumb, PT DPT  [phone]

## 2013-08-11 NOTE — Evaluation (Signed)
Occupational Therapy Evaluation Patient Details Name: BELLADONNA LUBINSKI MRN: [id number] DOB: May 17, 1928 Today's Date: 08/11/2013 Time: 4098-1191 OT Time Calculation (min): 33 min  OT Assessment / Plan / Recommendation History of present illness HPI: ARANTXA PIERCEY is a 79 y.o. female with a history of increasing confusion and weakness over the past few months who was found in the ED to have a large right frontal mass. She has been making errors with her checking account and acting confused as well. She presented today after a home health nurse checked on her and found her to be weak on the left and advised her to come to the ED.   Clinical Impression   PT admitted with confusion and weakness over 5 week period. Pt currently with functional limitiations due to the deficits listed below (see OT problem list). MRI(+) for Rt frontal Mass. Pt will benefit from skilled OT to increase their independence and safety with adls and balance to allow discharge HHOT. Question need for CIR consult if surgery recommend by neurosurgery. No notes to indicate neurosurgery input at this time. OT to revised recommendation after next treatment if needed. Question family ability to (A) with 24 / 7 (A)     OT Assessment  Patient needs continued OT Services    Follow Up Recommendations  Other (comment);Home health OT;Supervision/Assistance - 24 hour (palliative consult/ TBA- question CIR if surgery planned.)    Barriers to Discharge      Equipment Recommendations  3 in 1 bedside comode;Wheelchair (measurements OT);Wheelchair cushion (measurements OT)    Recommendations for Other Services    Frequency  Min 2X/week    Precautions / Restrictions Precautions Precautions: Fall   Pertinent Vitals/Pain Urgency to void bladder and bowels    ADL  Eating/Feeding: NPO Grooming: Wash/dry face;Set up Where Assessed - Grooming: Supported sitting Lower Body Bathing: Moderate assistance Where Assessed - Lower  Body Bathing: Supported sit to stand Upper Body Dressing: Moderate assistance Where Assessed - Upper Body Dressing: Supported sitting Lower Body Dressing: Moderate assistance Where Assessed - Lower Body Dressing: Supported sit to Pharmacist, hospital: Moderate assistance Toilet Transfer Method: Sit to stand (incontinence due to urgency) Toilet Transfer Equipment: Raised toilet seat with arms (or 3-in-1 over toilet) Toileting - Clothing Manipulation and Hygiene: Moderate assistance Where Assessed - Toileting Clothing Manipulation and Hygiene: Sit to stand from 3-in-1 or toilet Equipment Used: Gait belt Transfers/Ambulation Related to ADLs: Pt ambulating with min to mod (A)  ADL Comments: Pt supine with incontinence of bladder. Pt did not inform staff until OT requesting eob mobility. Pt reports beign aware but not informing staff. Pt reports having abiliyt to walk to 3n1 over toilet without urgency. Once standing pt reports immediate need to void. pt voiding bladder at EOB. pt transfered onto 3n1 bedside. pt voiding constanting with hip flexion attempting to don new clean socks. pt voiding bowels. Pt with full bath and returned to supine position. Pt verbalized wanting lunch. Pt awaiting SLP consult    OT Diagnosis: Generalized weakness;Cognitive deficits  OT Problem List: Decreased strength;Decreased activity tolerance;Impaired balance (sitting and/or standing);Decreased cognition;Decreased safety awareness;Decreased knowledge of use of DME or AE;Obesity OT Treatment Interventions: Self-care/ADL training;Therapeutic exercise;DME and/or AE instruction;Therapeutic activities;Cognitive remediation/compensation;Patient/family education;Balance training   OT Goals(Current goals can be found in the care plan section) Acute Rehab OT Goals Patient Stated Goal: to have better balance OT Goal Formulation: With patient Time For Goal Achievement: 08/25/13 Potential to Achieve Goals: Good  Visit  Information  Last OT  Received On: 08/11/13 Assistance Needed: +1 PT/OT/SLP Co-Evaluation/Treatment: Yes Reason for Co-Treatment: Necessary to address cognition/behavior during functional activity OT goals addressed during session: ADL's and self-care History of Present Illness: HPI: LATOYIA TECSON is a 79 y.o. female with a history of increasing confusion and weakness over the past few months who was found in the ED to have a large right frontal mass. She has been making errors with her checking account and acting confused as well. She presented today after a home health nurse checked on her and found her to be weak on the left and advised her to come to the ED.       Prior Functioning     Home Living Family/patient expects to be discharged to:: Private residence Living Arrangements: Children Available Help at Discharge: Available 24 hours/day Type of Home: House Home Access: Stairs to enter [employer] of Steps: 7 Entrance Stairs-Rails: Right;Left Home Layout: Multi-level;Bed/bath upstairs Home Equipment: Environmental consultant - 4 wheels;Walker - standard;Cane - single point Additional Comments: walk in shower with threshold, door, standard commode Prior Function Level of Independence: Independent Communication Communication: No difficulties Dominant Hand: Right         Vision/Perception Vision - History Baseline Vision: Wears glasses only for reading Patient Visual Report: Blurring of vision (difficult to assess due to cognition) Vision - Assessment Vision Assessment: Vision tested Ocular Range of Motion: Impaired-to be further tested in functional context Tracking/Visual Pursuits: Impaired - to be further tested in functional context;Requires cues, head turns, or add eye shifts to track Additional Comments: due to cognition vision difficutl to assess. pt providing conflicting information. Pt reports vision is blurred but later with questioning denies blurred vision    Cognition  Cognition Arousal/Alertness: Awake/alert Behavior During Therapy: Flat affect Overall Cognitive Status: Impaired/Different from baseline Area of Impairment: Safety/judgement;Awareness Memory: Decreased recall of precautions;Decreased short-term memory Safety/Judgement: Decreased awareness of deficits Awareness: Anticipatory    Extremity/Trunk Assessment Upper Extremity Assessment Upper Extremity Assessment: LUE deficits/detail LUE Sensation: decreased light touch Lower Extremity Assessment Lower Extremity Assessment: Generalized weakness;LLE deficits/detail LLE Sensation: decreased light touch     Mobility Bed Mobility Bed Mobility: Supine to Sit;Sitting - Scoot to Edge of Bed;Sit to Supine Supine to Sit: 4: Min assist Sitting - Scoot to [place] of Bed: 4: Min assist Details for Bed Mobility Assistance: VCs and multimodal cues Transfers Sit to Stand: 4: Min assist;3: Mod assist;From bed;From chair/3-in-1 Stand to Sit: 4: Min assist;To bed;To chair/3-in-1 Details for Transfer Assistance: Increased assist inititally, but as patient attempted multiple transfers decrease assist needed     Exercise     Balance Static Sitting Balance Static Sitting - Balance Support: Feet supported Static Sitting - Level of Assistance: 5: Stand by assistance Dynamic Sitting Balance Dynamic Sitting - Balance Support: Feet supported;During functional activity Dynamic Sitting - Level of Assistance: 5: Stand by assistance Dynamic Sitting - Balance Activities: Lateral lean/weight shifting;Forward lean/weight shifting;Other (comment) (performing hygiene and pericare, pulling briefs up)   End of Session OT - End of Session Activity Tolerance: Patient tolerated treatment well Patient left: in bed;with call bell/phone within reach;with bed alarm set Nurse Communication: Mobility status;Precautions  GO     Harolyn Rutherford 08/11/2013, 2:22 PM Pager: [phone]

## 2013-08-11 NOTE — Progress Notes (Signed)
BP 136/70  Pulse 64  Temp(Src) 98 F (36.7 C) (Oral)  Resp 20  Ht 5' 4.8" (1.646 m)  Wt 79.5 kg (175 lb 4.3 oz)  BMI 29.34 kg/m2  SpO2 95% - Agree with above cont steroids. - Cont steroids. Consult neurosurgery.

## 2013-08-11 NOTE — Progress Notes (Signed)
Noted that patient has no charted bedside swallow exam, commenced exam when patient returned from CT  This morning. She stated she has a history of trouble swallowing occasionally. Screen ended, patient remaining NPO, Speech Therapy swallow eval ordered per protocol. Patient states understanding of this.  Minor, Yvette Rack

## 2013-08-11 NOTE — Progress Notes (Signed)
PT Cancellation Note  Patient Details Name: [name] MRN: [id number] DOB: 1928/01/31   Cancelled Treatment:    Reason Eval/Treat Not Completed: Patient at procedure or test/unavailable.   Fabio Asa 08/11/2013, 11:26 AM

## 2013-08-11 NOTE — ED Notes (Signed)
[name] (Daughter) [phone] or [phone] [name] (Daughter) [phone]  [name] ([address]- [place]- [phone]

## 2013-08-11 NOTE — Progress Notes (Signed)
Subjective: The patient notes that her left arm and leg weakness is improving, with increased strength this morning, after starting decadron.  Objective: Current vital signs: BP 136/70  Pulse 64  Temp(Src) 98 F (36.7 C) (Oral)  Resp 20  Ht 5' 4.8" (1.646 m)  Wt 175 lb 4.3 oz (79.5 kg)  BMI 29.34 kg/m2  SpO2 95%  Neurologic Exam: Mental Status:  A&O x3, speech fluent, no aphasia.  Pt follows commands with minimal difficulty Cranial Nerves:  II - visual fields intact, PERRL III/IV/VI - EOMI  V/VII - no facial numbness or weakness  VIII - hearing intact IX,X - palate elevates bilaterally, normal speech XI: trapezius strength/neck flexion strength normal bilaterally XII: tongue strength normal  Motor: strength 5/5 in right upper and lower extremities.  Strength still 4+/5 in left upper and lower extremities, though improved from prior.  Normal muscle bulk and tone Sensory: intact Deep Tendon Reflexes: 2+ bilaterally Cerebellar: finger-to-nose test intact on right, though somewhat slower with occasional inaccuracy on left   Medications: I have reviewed the patient's current medications. Scheduled Meds: . amitriptyline  25 mg Oral QHS  . aspirin EC  81 mg Oral Daily  . dexamethasone  4 mg Intravenous Q6H  . docusate sodium  100 mg Oral BID  . levothyroxine  100 mcg Oral QAC breakfast  . losartan  50 mg Oral Daily  . pantoprazole  40 mg Oral BID  . simvastatin  20 mg Oral q1800  . sodium chloride  3 mL Intravenous Q12H   Continuous Infusions:  PRN Meds:.acetaminophen, acetaminophen, ALPRAZolam, HYDROcodone-acetaminophen, methocarbamol, ondansetron (ZOFRAN) IV, ondansetron   Assessment/Plan: The patient is an 79 yo woman, presenting with a large right-sided brain tumor with significant surrounding edema and some herniation.  It is unclear at this time whether this is a primary or secondary tumor. -ordered CT chest/abdomen/pelvis to investigate for additional masses (mets vs  primary) -will wait on consulting neurosurgery for now, pending above tests for metastatic disease -continue decadron for edema  Janalyn Harder, PGY3 08/11/2013  11:23 AM   This patient was evaluated by me along with the internal medicine resident, and I concur with the above clinical assessment and management recommendations.  Venetia Maxon M.D. [facility] [phone]

## 2013-08-11 NOTE — Progress Notes (Signed)
Spoke with patient's daughter on the phone (carmen) that Dr Conchita Paris would prefer to speak to her and any other family members in person tomorrow, daughter said she would be here probably around 10 am.

## 2013-08-12 DIAGNOSIS — R51 Headache: Secondary | ICD-10-CM

## 2013-08-12 DIAGNOSIS — E039 Hypothyroidism, unspecified: Secondary | ICD-10-CM

## 2013-08-12 LAB — TYPE AND SCREEN
ABO/RH(D): AB NEG
Antibody Screen: NEGATIVE

## 2013-08-12 LAB — ABO/RH: ABO/RH(D): AB NEG

## 2013-08-12 LAB — URINE CULTURE: Colony Count: NO GROWTH

## 2013-08-12 NOTE — Progress Notes (Signed)
No issues overnight. Pt reports improvement in her left-sided strength, and ability to walk. She cont to deny HA, visual changes.  EXAM:  BP 141/72  Pulse 68  Temp(Src) 98.1 F (36.7 C) (Oral)  Resp 18  Ht 5' 4.8" (1.646 m)  Wt 79.5 kg (175 lb 4.3 oz)  BMI 29.34 kg/m2  SpO2 95%  Awake, alert, oriented  Speech fluent, appropriate  CN grossly intact  5/5 RUE/RLE 4/5 LUE/LLE  IMPRESSION:  79 y.o. female with large right frontal tumor requiring resection without know primary CA.  PLAN: - OR tomorrow am for right frontal tumor resection - NPO after MN - check coags, type and screen  And I spoke with the patient and her two daughters at the bedside today. I reviewed the MRI findings with the patient and her family. I cemented them that given the size of the lesion, lack of diagnosis, and associated edema and mass effect that the tumor required resection. I reviewed the risks of the surgical procedure including stroke with worsening left-sided weakness, seizures, bleeding, infection, coma, and death, as was the risks of general anesthesia including heart attack, stroke, and DVT, PE. I also reviewed the general hospital course after craniotomy. The patient and her daughters understood our discussion and all their questions were answered. They're willing to proceed with surgical resection as outlined above. Consent was obtained and placed in the chart.  I also spoke with the patient's attending physician regarding the plan for surgical resection above.

## 2013-08-12 NOTE — Progress Notes (Signed)
Physical Therapy Treatment Patient Details Name: [name] MRN: [id number] DOB: Apr 01, 1928 Today's Date: 08/12/2013 Time: 4098-1191 PT Time Calculation (min): 25 min  PT Assessment / Plan / Recommendation  History of Present Illness HPI: [name] is a 79 y.o. female with a history of increasing confusion and weakness over the past few months who was found in the ED to have a large right frontal mass. She has been making errors with her checking account and acting confused as well. She presented today after a home health nurse checked on her and found her to be weak on the left and advised her to come to the ED.   PT Comments   Patient continues to demonstrate slowed processing with some cues as well as tactile fixation on telemetry wires, toilet paper, wash cloth, and walker handles requiring manual cues to assist patient in letting go. Patient ambulate in hall with RW, some safety concerns with use and positioning within rw. Will continue to see and progress activity as tolerated.  Follow Up Recommendations  Home health PT;Supervision/Assistance - 24 hour     Does the patient have the potential to tolerate intense rehabilitation     Barriers to Discharge        Equipment Recommendations  Rolling walker with 5" wheels    Recommendations for Other Services    Frequency Min 4X/week   Progress towards PT Goals Progress towards PT goals: Progressing toward goals  Plan Current plan remains appropriate (will follow for needs as POC is updated)    Precautions / Restrictions Precautions Precautions: Fall   Pertinent Vitals/Pain No headache, but reports warm sensation front of skull today    Mobility  Bed Mobility Bed Mobility: Supine to Sit;Sitting - Scoot to Edge of Bed;Sit to Supine Supine to Sit: 4: Min assist Sitting - Scoot to [place] of Bed: 4: Min assist Details for Bed Mobility Assistance: VCs and multimodal cues Transfers Transfers: Sit to Stand;Stand to  Sit Sit to Stand: 4: Min assist;3: Mod assist;From bed;From toilet Stand to Sit: 4: Min assist;To bed;To toilet Details for Transfer Assistance: minimal assist for safety, VCs for positioning and hand placement Ambulation/Gait Ambulation/Gait Assistance: 4: Min assist;3: Mod assist Ambulation Distance (Feet): 240 Feet Assistive device: Rolling walker Ambulation/Gait Assistance Details: cues for proper use of rw, patient easily distracted with tactile fixation during activity.    Exercises     PT Diagnosis:    PT Problem List:   PT Treatment Interventions:     PT Goals (current goals can now be found in the care plan section) Acute Rehab PT Goals Patient Stated Goal: to have better balance PT Goal Formulation: With patient Time For Goal Achievement: 08/25/13 Potential to Achieve Goals: Fair  Visit Information  Last PT Received On: 08/12/13 Assistance Needed: +1 History of Present Illness: HPI: [name] is a 79 y.o. female with a history of increasing confusion and weakness over the past few months who was found in the ED to have a large right frontal mass. She has been making errors with her checking account and acting confused as well. She presented today after a home health nurse checked on her and found her to be weak on the left and advised her to come to the ED.    Subjective Data  Subjective: i have to use the restroom Patient Stated Goal: to have better balance   Cognition  Cognition Arousal/Alertness: Awake/alert Behavior During Therapy: Flat affect Overall Cognitive Status: Impaired/Different from baseline  Area of Impairment: Safety/judgement;Awareness Memory: Decreased recall of precautions;Decreased short-term memory Safety/Judgement: Decreased awareness of deficits Awareness: Anticipatory    Balance  Dynamic Sitting Balance Dynamic Sitting - Balance Support: Feet supported;During functional activity Dynamic Sitting - Level of Assistance: 7:  Independent  End of Session PT - End of Session Equipment Utilized During Treatment: Gait belt Activity Tolerance: Patient tolerated treatment well Patient left: in bed;with call bell/phone within reach;with bed alarm set;with family/visitor present Nurse Communication: Mobility status   GP     Fabio Asa 08/12/2013, 4:33 PM Charlotte Crumb, PT DPT  [phone]

## 2013-08-12 NOTE — Progress Notes (Addendum)
[facility] PROGRESS NOTE  [name] IEP:[id number] DOB: 1927/12/28 DOA: 08/10/2013 PCP: [name], MD  Assessment/Plan: Hemiplegia affecting nondominant side - per history patient have had left leg weakness although currently it's not noted by exam. -PT evaluation    Brain mass- has history of malignant thyroid; Decadron 4 mg every 6 hours, neuro checks,  CT scan of the abdomen and chest: no source of primary mass  - Holding Plavix which used to be taken for history of CVA -neurosurgery eval and meeting with family  HTN- stable  Hypothyroid- home meds  HLD- stable    Code Status: full Family Communication: patient Disposition Plan:    Consultants:  Neurology  neurosurgery  Procedures:    Antibiotics:    HPI/Subjective: Moving arms and legs better  Objective: Filed Vitals:   08/12/13 0554  BP: 99/76  Pulse: 61  Temp: 98.2 F (36.8 C)  Resp: 18    Intake/Output Summary (Last 24 hours) at 08/12/13 1108 Last data filed at 08/12/13 6606  Gross per 24 hour  Intake    480 ml  Output      0 ml  Net    480 ml   Filed Weights   08/11/13 0052  Weight: 79.5 kg (175 lb 4.3 oz)    Exam:   General:  Pleasant/cooperative, NAD  Cardiovascular: rrr  Respiratory: clear anterior  Abdomen: +BS, soft  Musculoskeletal: 4/4 strength   Data Reviewed: Basic Metabolic Panel:  Recent Labs Lab 08/10/13 1929 08/11/13 0414  NA 137 136  K 4.3 4.9  CL 103 103  CO2 26 26  GLUCOSE 84 152*  BUN 17 17  CREATININE 1.27* 1.23*  CALCIUM 9.0 9.2  MG  --  2.4  PHOS  --  2.8   Liver Function Tests:  Recent Labs Lab 08/11/13 0414  AST 18  ALT 14  ALKPHOS 88  BILITOT 0.4  PROT 6.7  ALBUMIN 3.3*   No results found for this basename: LIPASE, AMYLASE,  in the last 168 hours No results found for this basename: AMMONIA,  in the last 168 hours CBC:  Recent Labs Lab 08/10/13 1929 08/11/13 0414  WBC 7.3 7.6  NEUTROABS 4.8  --    HGB 12.1 12.8  HCT 37.0 39.1  MCV 94.9 95.1  PLT 224 225   Cardiac Enzymes: No results found for this basename: CKTOTAL, CKMB, CKMBINDEX, TROPONINI,  in the last 168 hours BNP (last 3 results) No results found for this basename: PROBNP,  in the last 8760 hours CBG: No results found for this basename: GLUCAP,  in the last 168 hours  No results found for this or any previous visit (from the past 240 hour(s)).   Studies: Ct Abdomen Pelvis Wo Contrast  08/11/2013   CLINICAL DATA:  Recently diagnosed brain neoplasm this study is performed for staging evaluation  EXAM: CT CHEST WITHOUT CONTRAST; CT ABDOMEN AND PELVIS WITHOUT CONTRAST  TECHNIQUE: Multidetector CT imaging of the chest was performed following the standard protocol without IV contrast.; Multidetector CT imaging of the abdomen and pelvis was performed following the standard protocol without intravenous contrast.  COMPARISON:  None.  FINDINGS: Noncontrast evaluation thoracic inlet is unremarkable.  There is no evidence of mediastinal nor hilar adenopathy nor masses. Trace bilateral pleural effusions are appreciated. A very small hiatal hernia is identified. There is mild-to-moderate multichamber cardiac enlargement. Coronary artery atherosclerotic calcifications are identified as well as atherosclerotic calcifications within the aorta. There is no evidence of a thoracic aortic  aneurysm.  There is small bibasilar pleural effusions are identified as well as minimal atelectasis versus trace infiltrates. There is no evidence of a pulmonary masses or nodules. Small calcified pleural plaques are identified posteriorly within the lung apices. There is no evidence of pulmonary nodules, masses, infiltrates. The central airways are patent.  Noncontrast evaluation of the liver demonstrates very small punctate calcified densities within the dome the right lobe of the liver. The liver parenchyma is otherwise unremarkable. Patient status post  cholecystectomy. The spleen, adrenals, pancreas, kidneys are unremarkable.  There is no evidence of bowel obstruction. There no secondary signs reflecting enteritis, colitis, diverticulitis, normal appendicitis.  There is no evidence of abdominal or pelvic adenopathy, masses, nor free fluid. The urinary bladder is distended and appears to be filled with urine.  The osseous structures demonstrate multilevel multifactorial degenerative changes throughout the thoracolumbar spine without evidence of aggressive appearing lytic or sclerotic lesions.  There is no evidence of an abdominal aortic aneurysm. Atherosclerotic calcifications are identified within the abdominal aorta. The  IMPRESSION: 1. Trace bilateral pleural effusions with findings likely representing mild areas of atelectasis within the lung bases. Minimal infiltrates and clinically appropriate cannot be excluded. Otherwise no evidence of pulmonary nodules nor masses. 2. There is no evidence of masses nor nodules within the chest abdomen or pelvis. There is no evidence of pathologic size adenopathy. 3. Moderate amount of fecal retention without evidence of obstructive or inflammatory abnormalities. 4. Small calcified pleural plaques within the lung apices findings may represent sequela prior asbestos exposure. 5. Urinary bladder is dilated consistent with the patient's history of urinary incontinence.   Electronically Signed   By: Salome Holmes M.D.   On: 08/11/2013 13:49   Ct Head Wo Contrast  08/10/2013   CLINICAL DATA:  Dizziness and headache  EXAM: CT HEAD WITHOUT CONTRAST  TECHNIQUE: Contiguous axial images were obtained from the base of the skull through the vertex without intravenous contrast. Study was performed within 24 hr of patient's arrival at the emergency department.  COMPARISON:  June 30, 2008  FINDINGS: There is widespread vasogenic edema throughout the right frontal lobe extending into the anterior right parietal lobe. Within the  right frontal lobe, there is a masslike area measuring 4.0 by 3.3 cm. There is marked effacement of the lateral ventricles with midline shift toward the left by approximately 16 mm.  There is no hemorrhage. There is no subdural or epidural fluid. Bony calvarium appears intact. The mastoid air cells are clear.  IMPRESSION: Findings felt to represent mass in the right frontal lobe with extensive surrounding vasogenic edema which effaces the lateral ventricles and causes midline shift toward the left. Neoplasm is the most likely etiology for this finding, although abscess is a differential consideration on this study. Given these findings, contrast enhanced CT or pre and post-contrast MR to further assess would be warranted.  Critical Value/emergent results were called by telephone at the time of interpretation on 08/10/2013 at 8:22 PM to Dr. Rolland Porter , who verbally acknowledged these results.   Electronically Signed   By: Bretta Bang M.D.   On: 08/10/2013 20:23   Ct Chest Wo Contrast  08/11/2013   CLINICAL DATA:  Recently diagnosed brain neoplasm this study is performed for staging evaluation  EXAM: CT CHEST WITHOUT CONTRAST; CT ABDOMEN AND PELVIS WITHOUT CONTRAST  TECHNIQUE: Multidetector CT imaging of the chest was performed following the standard protocol without IV contrast.; Multidetector CT imaging of the abdomen and pelvis was performed following  the standard protocol without intravenous contrast.  COMPARISON:  None.  FINDINGS: Noncontrast evaluation thoracic inlet is unremarkable.  There is no evidence of mediastinal nor hilar adenopathy nor masses. Trace bilateral pleural effusions are appreciated. A very small hiatal hernia is identified. There is mild-to-moderate multichamber cardiac enlargement. Coronary artery atherosclerotic calcifications are identified as well as atherosclerotic calcifications within the aorta. There is no evidence of a thoracic aortic aneurysm.  There is small bibasilar  pleural effusions are identified as well as minimal atelectasis versus trace infiltrates. There is no evidence of a pulmonary masses or nodules. Small calcified pleural plaques are identified posteriorly within the lung apices. There is no evidence of pulmonary nodules, masses, infiltrates. The central airways are patent.  Noncontrast evaluation of the liver demonstrates very small punctate calcified densities within the dome the right lobe of the liver. The liver parenchyma is otherwise unremarkable. Patient status post cholecystectomy. The spleen, adrenals, pancreas, kidneys are unremarkable.  There is no evidence of bowel obstruction. There no secondary signs reflecting enteritis, colitis, diverticulitis, normal appendicitis.  There is no evidence of abdominal or pelvic adenopathy, masses, nor free fluid. The urinary bladder is distended and appears to be filled with urine.  The osseous structures demonstrate multilevel multifactorial degenerative changes throughout the thoracolumbar spine without evidence of aggressive appearing lytic or sclerotic lesions.  There is no evidence of an abdominal aortic aneurysm. Atherosclerotic calcifications are identified within the abdominal aorta. The  IMPRESSION: 1. Trace bilateral pleural effusions with findings likely representing mild areas of atelectasis within the lung bases. Minimal infiltrates and clinically appropriate cannot be excluded. Otherwise no evidence of pulmonary nodules nor masses. 2. There is no evidence of masses nor nodules within the chest abdomen or pelvis. There is no evidence of pathologic size adenopathy. 3. Moderate amount of fecal retention without evidence of obstructive or inflammatory abnormalities. 4. Small calcified pleural plaques within the lung apices findings may represent sequela prior asbestos exposure. 5. Urinary bladder is dilated consistent with the patient's history of urinary incontinence.   Electronically Signed   By: Margaree Mackintosh M.D.   On: 08/11/2013 13:49   Mr Brain Wo Contrast  08/10/2013   CLINICAL DATA:  Abnormal CT, mass.  EXAM: MRI HEAD WITHOUT CONTRAST  TECHNIQUE: Multiplanar, multiecho pulse sequences of the brain and surrounding structures were obtained without intravenous contrast. Please note, due to a low GFR of 39, no contrast was administered.  COMPARISON:  CT of the head August 10, 2012 at 2012 hr. MRI of the brain May 18, 2005  FINDINGS: Heterogeneous right frontal lobe 3.9 x 4.5 x 4.1 cm intraparenchymal mass. Associated reduced diffusion, with the lower signal superior component, the T2 bright more cystic component inferiorly shows less reduced diffusion, there is corresponding low ADC values. Surrounding T2 bright vasogenic edema, with local mass effect, effacing the sulci, with 13 mm of right to left subfalcine herniation. No intrinsic T1 shortening. Due to mass effect, the anterior cerebral arteries are displaced to the left. Minimal susceptibility artifact along the inferior aspect of the mass.  No hydrocephalus or ventricular entrapment. A few subcentimeter scattered supratentorial white matter T2 hyperintensities exclusive of the right frontal lobe process suggest sequela of chronic small vessel ischemic disease. Remote tiny right cerebellar lacunar infarct. No abnormal extra-axial fluid collections.  Status post bilateral ocular lens implants. Visualized paranasal sinuses and mastoid air cells appear well-aerated. No suspicious calvarial bone marrow signal. No abnormal sellar expansion. Craniocervical junction maintained.  IMPRESSION: 3.9 x 4.5  x 4.1 cm right frontal lobe intraparenchymal heterogeneous mass (the cystic component may reflect degeneration) with imaging characteristics highly concerning for hypercellular tumor such as glioblastoma multiforme, this less likely reflects abscess, or metastasis. Extensive surrounding FLAIR abnormality may reflect edema and, possibly infiltrating tumor.  1.3 cm of right to left subfalcine herniation, displacing the anterior cerebral arteries to the left, without ACA territory infarct.  Critical Value/emergent results were called by telephone at the time of interpretation on 08/10/2013 at 11:06 PM to Dr. Tanna Furry , who verbally acknowledged these results.   Electronically Signed   By: Elon Alas   On: 08/10/2013 23:07   Mr Brain W Wo Contrast  08/11/2013   CLINICAL DATA:  Evaluate frontal tumor.  EXAM: MRI HEAD WITHOUT AND WITH CONTRAST  TECHNIQUE: Multiplanar, multiecho pulse sequences of the brain and surrounding structures were obtained without and with intravenous contrast.  CONTRAST:  48mL MULTIHANCE GADOBENATE DIMEGLUMINE 529 MG/ML IV SOLN. Half dose contrast was utilized secondary to patient's recent low GFR.  COMPARISON:  08/10/2013 MR and CTA.  FINDINGS: 5.1 x 4.6 x 4.5 cm anterior medial right frontal lobe intra-axial appearing partially necrotic mass with peripheral enhancement with marked surrounding vasogenic edema. This causing significant compression of the lateral ventricle inferiorly and to left with 1.1 cm midline shift to left. Findings are suspicious for primary tumor such as glioblastoma. Despite the restricted motion, abscess is felt to be a much less likely consideration. Solitary metastatic lesion felt unlikely although not excluded.  The edema extends into the corpus callosum to left of midline. This surrounding vasogenic edema may contain tumor cells without surrounding enhancement.  Marked compression of the anterior cerebral arteries to left.  Mild small vessel disease type changes.  A tiny amount of hemorrhage within the right frontal mass cannot be excluded. No other areas of intracranial hemorrhage noted.  Small left vertebral artery. Major intracranial vascular structures are patent.  Degenerative changes C1-2 articulation or transverse ligament hypertrophy.  IMPRESSION: IMPRESSION  5.1 x 4.6 x 4.5 cm anterior medial right  frontal lobe intra-axial appearing partially necrotic mass with peripheral enhancement with marked surrounding vasogenic edema. This causing significant compression of the lateral ventricle inferiorly and to left with 1.1 cm midline shift to left. Findings are suspicious for primary tumor such as glioblastoma. Despite the restricted motion, abscess is felt to be a much less likely consideration. Solitary metastatic lesion felt unlikely although not excluded.  The edema extends into the corpus callosum to left of midline. This surrounding vasogenic edema may contain tumor cells without surrounding enhancement.  Marked compression of the anterior cerebral arteries to left.  Despite the broad base along the periphery of the brain, this mass appears intra-axial (as best appreciated on high-resolution slightly motion degraded coronal T2 images).   Electronically Signed   By: Chauncey Cruel M.D.   On: 08/11/2013 21:42    Scheduled Meds: . amitriptyline  25 mg Oral QHS  . dexamethasone  4 mg Intravenous Q6H  . docusate sodium  100 mg Oral BID  . levothyroxine  100 mcg Oral QAC breakfast  . losartan  50 mg Oral Daily  . pantoprazole  40 mg Oral BID  . simvastatin  20 mg Oral q1800  . sodium chloride  3 mL Intravenous Q12H   Continuous Infusions:   Active Problems:   Hemiplegia affecting nondominant side   Brain mass    Time spent: 35 min    VANN, JESSICA  [facility] Pager [phone]. If  7PM-7AM, please contact night-coverage at [email or web address], password [facility] 08/12/2013, 11:08 AM  LOS: 2 days

## 2013-08-12 NOTE — Progress Notes (Signed)
Subjective: The patient notes continued improvement in left arm and leg weakness this morning.  Neurosurgery spoke with the patient yesterday, and plans to have a family discussion today regarding treatment options (including surgery).  Objective: Current vital signs: BP 99/76  Pulse 61  Temp(Src) 98.2 F (36.8 C) (Oral)  Resp 18  Ht 5' 4.8" (1.646 m)  Wt 175 lb 4.3 oz (79.5 kg)  BMI 29.34 kg/m2  SpO2 97%  Neurologic Exam: Mental Status:  A&O x3, speech fluent, no aphasia.  Pt follows commands with minimal difficulty Cranial Nerves:  II - visual fields intact, PERRL III/IV/VI - EOMI  V/VII - no facial numbness or weakness  VIII - hearing intact IX,X - palate elevates bilaterally, normal speech XI: trapezius strength/neck flexion strength normal bilaterally XII: tongue strength normal  Motor: strength 5/5 in right upper and lower extremities.  Strength 5/5 in left upper extremity.  Strength 4+/5 in left lower extremity.  Normal muscle bulk and tone Sensory: intact Deep Tendon Reflexes: 2+ bilaterally Cerebellar: finger-to-nose test intact on right, though somewhat slower on left   Medications: I have reviewed the patient's current medications. Scheduled Meds: . amitriptyline  25 mg Oral QHS  . dexamethasone  4 mg Intravenous Q6H  . docusate sodium  100 mg Oral BID  . levothyroxine  100 mcg Oral QAC breakfast  . losartan  50 mg Oral Daily  . pantoprazole  40 mg Oral BID  . simvastatin  20 mg Oral q1800  . sodium chloride  3 mL Intravenous Q12H   Continuous Infusions:  PRN Meds:.acetaminophen, acetaminophen, ALPRAZolam, HYDROcodone-acetaminophen, methocarbamol, ondansetron (ZOFRAN) IV, ondansetron   Assessment/Plan: The patient is an 79 yo woman, presenting with a large right-sided brain tumor with significant surrounding edema and some herniation.  CT chest/abd/pelvis showed no signs of tumor in other locations.  The cause of this tumor is unknown, but GBM is a strong  possibility.  Of note, the patient has a history of thyroid cancer, s/p resection and radioiodine. -neurosurgery consulted, appreciate recs -neurosurgery to discuss surgery with family today -continue decadron for edema -avoid aspirin, given potential for upcoming surgery  Elnora Morrison, PGY3 Pgr. [phone] 08/12/2013  8:22 AM  Addendum: Patient examined and management discussed with above resident.  On examination left upper extremity at 5-/5 with pronator drift and 5-/5 hand grip.  Left leg 5- to 4+/5.  Patient remains on Steroids and strength improved from initial presentation.  Conversation today concerning possible surgery.   Will continue to follow with you.  Alexis Goodell, MD [facility] [phone] 08/12/2013  9:36 AM

## 2013-08-13 ENCOUNTER — Encounter (HOSPITAL_COMMUNITY): Payer: Medicare Other | Admitting: Anesthesiology

## 2013-08-13 ENCOUNTER — Inpatient Hospital Stay (HOSPITAL_COMMUNITY): Payer: Medicare Other

## 2013-08-13 ENCOUNTER — Inpatient Hospital Stay (HOSPITAL_COMMUNITY): Payer: Medicare Other | Admitting: Anesthesiology

## 2013-08-13 ENCOUNTER — Encounter (HOSPITAL_COMMUNITY)
Admission: EM | Disposition: A | Discharge status: Skilled Nursing Facility | Payer: Self-pay | Source: Home / Self Care | Attending: Internal Medicine

## 2013-08-13 DIAGNOSIS — C73 Malignant neoplasm of thyroid gland: Secondary | ICD-10-CM

## 2013-08-13 HISTORY — PX: CRANIOTOMY: SHX93

## 2013-08-13 LAB — SURGICAL PCR SCREEN: MRSA, PCR: NEGATIVE

## 2013-08-13 SURGERY — CRANIOTOMY TUMOR EXCISION
Anesthesia: General | Site: Head | Laterality: Right

## 2013-08-13 MED ORDER — THROMBIN 5000 UNITS EX SOLR
OROMUCOSAL | Status: DC | PRN
  Administered 2013-08-13 (×2): via TOPICAL

## 2013-08-13 MED ORDER — ONDANSETRON HCL 4 MG PO TABS: 4.0000 mg | ORAL_TABLET | ORAL | Status: DC | PRN

## 2013-08-13 MED ORDER — FENTANYL CITRATE 0.05 MG/ML IJ SOLN
INTRAMUSCULAR | Status: DC | PRN
  Administered 2013-08-13 (×4): 50 ug via INTRAVENOUS
  Administered 2013-08-13: 100 ug via INTRAVENOUS
  Administered 2013-08-13 (×4): 50 ug via INTRAVENOUS

## 2013-08-13 MED ORDER — LIDOCAINE HCL (CARDIAC) 20 MG/ML IV SOLN
INTRAVENOUS | Status: DC | PRN
  Administered 2013-08-13: 60 mg via INTRAVENOUS

## 2013-08-13 MED ORDER — BUPIVACAINE HCL (PF) 0.5 % IJ SOLN
INTRAMUSCULAR | Status: DC | PRN
  Administered 2013-08-13: 7 mL

## 2013-08-13 MED ORDER — THROMBIN 20000 UNITS EX SOLR
CUTANEOUS | Status: DC | PRN
  Administered 2013-08-13 (×2): via TOPICAL

## 2013-08-13 MED ORDER — OXYCODONE HCL 5 MG PO TABS: 5.0000 mg | ORAL_TABLET | Freq: Once | ORAL | Status: DC | PRN

## 2013-08-13 MED ORDER — NEOSTIGMINE METHYLSULFATE 1 MG/ML IJ SOLN
INTRAMUSCULAR | Status: DC | PRN
  Administered 2013-08-13: 5 mg via INTRAVENOUS

## 2013-08-13 MED ORDER — PHENYLEPHRINE HCL 10 MG/ML IJ SOLN
10.0000 mg | INTRAVENOUS | Status: DC | PRN
  Administered 2013-08-13: 20 ug/min via INTRAVENOUS

## 2013-08-13 MED ORDER — BACITRACIN ZINC 500 UNIT/GM EX OINT
TOPICAL_OINTMENT | CUTANEOUS | Status: DC | PRN
  Administered 2013-08-13 (×2): 1 via TOPICAL

## 2013-08-13 MED ORDER — LABETALOL HCL 5 MG/ML IV SOLN
10.0000 mg | INTRAVENOUS | Status: DC | PRN
  Filled 2013-08-13: qty 8

## 2013-08-13 MED ORDER — MANNITOL 25 % IV SOLN
50.0000 g | Freq: Once | INTRAVENOUS | Status: AC
  Administered 2013-08-13: 50 g via INTRAVENOUS
  Filled 2013-08-13: qty 200

## 2013-08-13 MED ORDER — ONDANSETRON HCL 4 MG/2ML IJ SOLN: 4.0000 mg | INTRAMUSCULAR | Status: DC | PRN

## 2013-08-13 MED ORDER — SODIUM CHLORIDE 0.9 % IR SOLN
Status: DC | PRN
  Administered 2013-08-13: 13:00:00

## 2013-08-13 MED ORDER — PANTOPRAZOLE SODIUM 40 MG IV SOLR
40.0000 mg | Freq: Every day | INTRAVENOUS | Status: DC
  Administered 2013-08-13: 40 mg via INTRAVENOUS
  Filled 2013-08-13 (×2): qty 40

## 2013-08-13 MED ORDER — SODIUM CHLORIDE 0.9 % IV SOLN
INTRAVENOUS | Status: DC | PRN
  Administered 2013-08-13 (×2): via INTRAVENOUS

## 2013-08-13 MED ORDER — GLYCOPYRROLATE 0.2 MG/ML IJ SOLN
INTRAMUSCULAR | Status: DC | PRN
  Administered 2013-08-13: .7 mg via INTRAVENOUS

## 2013-08-13 MED ORDER — CEFAZOLIN SODIUM-DEXTROSE 2-3 GM-% IV SOLR
INTRAVENOUS | Status: AC
  Administered 2013-08-13: 2 g via INTRAVENOUS
  Filled 2013-08-13: qty 50

## 2013-08-13 MED ORDER — SODIUM CHLORIDE 0.9 % IV SOLN
1000.0000 mg | INTRAVENOUS | Status: AC
  Administered 2013-08-13: 1000 mg via INTRAVENOUS
  Filled 2013-08-13: qty 10

## 2013-08-13 MED ORDER — NITROGLYCERIN IN D5W 200-5 MCG/ML-% IV SOLN
2.0000 ug/min | INTRAVENOUS | Status: AC
  Filled 2013-08-13: qty 250

## 2013-08-13 MED ORDER — VECURONIUM BROMIDE 10 MG IV SOLR
INTRAVENOUS | Status: DC | PRN
  Administered 2013-08-13: 2 mg via INTRAVENOUS
  Administered 2013-08-13: 1 mg via INTRAVENOUS
  Administered 2013-08-13: 3 mg via INTRAVENOUS

## 2013-08-13 MED ORDER — ONDANSETRON HCL 4 MG/2ML IJ SOLN
INTRAMUSCULAR | Status: DC | PRN
  Administered 2013-08-13: 4 mg via INTRAVENOUS

## 2013-08-13 MED ORDER — MICROFIBRILLAR COLL HEMOSTAT EX PADS
MEDICATED_PAD | CUTANEOUS | Status: DC | PRN
  Administered 2013-08-13: 1 via TOPICAL

## 2013-08-13 MED ORDER — FENTANYL CITRATE 0.05 MG/ML IJ SOLN: 25.0000 ug | INTRAMUSCULAR | Status: DC | PRN

## 2013-08-13 MED ORDER — MIDAZOLAM HCL 5 MG/5ML IJ SOLN
INTRAMUSCULAR | Status: DC | PRN
  Administered 2013-08-13: 1 mg via INTRAVENOUS

## 2013-08-13 MED ORDER — OXYCODONE HCL 5 MG/5ML PO SOLN: 5.0000 mg | Freq: Once | ORAL | Status: DC | PRN

## 2013-08-13 MED ORDER — ROCURONIUM BROMIDE 100 MG/10ML IV SOLN
INTRAVENOUS | Status: DC | PRN
  Administered 2013-08-13: 50 mg via INTRAVENOUS

## 2013-08-13 MED ORDER — PROPOFOL 10 MG/ML IV BOLUS
INTRAVENOUS | Status: DC | PRN
  Administered 2013-08-13: 100 mg via INTRAVENOUS
  Administered 2013-08-13: 50 mg via INTRAVENOUS

## 2013-08-13 MED ORDER — 0.9 % SODIUM CHLORIDE (POUR BTL) OPTIME
TOPICAL | Status: DC | PRN
  Administered 2013-08-13 (×3): 1000 mL

## 2013-08-13 MED ORDER — PROMETHAZINE HCL 12.5 MG PO TABS
12.5000 mg | ORAL_TABLET | ORAL | Status: DC | PRN
  Filled 2013-08-13: qty 2

## 2013-08-13 MED ORDER — HYDRALAZINE HCL 20 MG/ML IJ SOLN
INTRAMUSCULAR | Status: DC | PRN
  Administered 2013-08-13 (×2): 5 mg via INTRAVENOUS

## 2013-08-13 MED ORDER — DEXAMETHASONE SODIUM PHOSPHATE 10 MG/ML IJ SOLN
INTRAMUSCULAR | Status: AC
  Administered 2013-08-13: 10 mg via INTRAVENOUS
  Filled 2013-08-13: qty 1

## 2013-08-13 MED ORDER — LIDOCAINE-EPINEPHRINE 1 %-1:100000 IJ SOLN
INTRAMUSCULAR | Status: DC | PRN
  Administered 2013-08-13: 7 mL

## 2013-08-13 MED ORDER — ONDANSETRON HCL 4 MG/2ML IJ SOLN: 4.0000 mg | Freq: Four times a day (QID) | INTRAMUSCULAR | Status: DC | PRN

## 2013-08-13 MED ORDER — SODIUM CHLORIDE 0.9 % IV SOLN
500.0000 mg | Freq: Two times a day (BID) | INTRAVENOUS | Status: DC
  Administered 2013-08-13 – 2013-08-14 (×2): 500 mg via INTRAVENOUS
  Filled 2013-08-13 (×4): qty 5

## 2013-08-13 MED ORDER — HEPARIN SODIUM (PORCINE) 5000 UNIT/ML IJ SOLN
5000.0000 [IU] | Freq: Three times a day (TID) | INTRAMUSCULAR | Status: DC
  Administered 2013-08-14 – 2013-08-19 (×16): 5000 [IU] via SUBCUTANEOUS
  Filled 2013-08-13 (×19): qty 1

## 2013-08-13 SURGICAL SUPPLY — 102 items
BANDAGE GAUZE 4  KLING STR (GAUZE/BANDAGES/DRESSINGS) ×4 IMPLANT
BANDAGE GAUZE ELAST BULKY 4 IN (GAUZE/BANDAGES/DRESSINGS) ×4 IMPLANT
BENZOIN TINCTURE PRP APPL 2/3 (GAUZE/BANDAGES/DRESSINGS) IMPLANT
BLADE SAW GIGLI 16 STRL (MISCELLANEOUS) IMPLANT
BLADE SURG 15 STRL LF DISP TIS (BLADE) IMPLANT
BLADE SURG 15 STRL SS (BLADE)
BLADE SURG ROTATE 9660 (MISCELLANEOUS) ×2 IMPLANT
BLADE ULTRA TIP 2M (BLADE) IMPLANT
BRUSH SCRUB EZ 1% IODOPHOR (MISCELLANEOUS) IMPLANT
BUR ACORN 6.0 PRECISION (BURR) ×2 IMPLANT
BUR ADDG 1.1 (BURR) IMPLANT
BUR MATCHSTICK NEURO 3.0 LAGG (BURR) IMPLANT
BUR ROUTER D-58 CRANI (BURR) ×2 IMPLANT
CANISTER SUCT 3000ML (MISCELLANEOUS) ×4 IMPLANT
CATH VENTRIC 35X38 W/TROCAR LG (CATHETERS) IMPLANT
CLIP TI MEDIUM 6 (CLIP) IMPLANT
CONT SPEC 4OZ CLIKSEAL STRL BL (MISCELLANEOUS) ×4 IMPLANT
CORDS BIPOLAR (ELECTRODE) ×2 IMPLANT
COVER MAYO STAND STRL (DRAPES) IMPLANT
DECANTER SPIKE VIAL GLASS SM (MISCELLANEOUS) ×2 IMPLANT
DRAIN SNY WOU 7FLT (WOUND CARE) IMPLANT
DRAIN SUBARACHNOID (WOUND CARE) IMPLANT
DRAPE MICROSCOPE LEICA (MISCELLANEOUS) ×2 IMPLANT
DRAPE NEUROLOGICAL W/INCISE (DRAPES) ×2 IMPLANT
DRAPE ORTHO SPLIT 77X108 STRL (DRAPES)
DRAPE PROXIMA HALF (DRAPES) ×2 IMPLANT
DRAPE STERI IOBAN 125X83 (DRAPES) IMPLANT
DRAPE SURG 17X23 STRL (DRAPES) IMPLANT
DRAPE SURG IRRIG POUCH 19X23 (DRAPES) IMPLANT
DRAPE SURG ORHT 6 SPLT 77X108 (DRAPES) IMPLANT
DRAPE WARM FLUID 44X44 (DRAPE) ×2 IMPLANT
DRESSING TELFA 8X3 (GAUZE/BANDAGES/DRESSINGS) IMPLANT
DURAFORM SPONGE 2X2 SINGLE (Neuro Prosthesis/Implant) ×4 IMPLANT
DURAPREP 6ML APPLICATOR 50/CS (WOUND CARE) ×2 IMPLANT
ELECT CAUTERY BLADE 6.4 (BLADE) IMPLANT
ELECT REM PT RETURN 9FT ADLT (ELECTROSURGICAL) ×2
ELECTRODE REM PT RTRN 9FT ADLT (ELECTROSURGICAL) ×1 IMPLANT
EVACUATOR 1/8 PVC DRAIN (DRAIN) IMPLANT
EVACUATOR SILICONE 100CC (DRAIN) IMPLANT
FORCEPS BIPOLAR SPETZLER 8 1.0 (NEUROSURGERY SUPPLIES) ×2 IMPLANT
GAUZE SPONGE 4X4 16PLY XRAY LF (GAUZE/BANDAGES/DRESSINGS) IMPLANT
GLOVE BIOGEL PI IND STRL 7.5 (GLOVE) ×2 IMPLANT
GLOVE BIOGEL PI IND STRL 8 (GLOVE) ×2 IMPLANT
GLOVE BIOGEL PI INDICATOR 7.5 (GLOVE) ×2
GLOVE BIOGEL PI INDICATOR 8 (GLOVE) ×2
GLOVE ECLIPSE 6.5 STRL STRAW (GLOVE) IMPLANT
GLOVE ECLIPSE 7.0 STRL STRAW (GLOVE) ×2 IMPLANT
GLOVE ECLIPSE 7.5 STRL STRAW (GLOVE) ×10 IMPLANT
GLOVE EXAM NITRILE LRG STRL (GLOVE) IMPLANT
GLOVE EXAM NITRILE MD LF STRL (GLOVE) IMPLANT
GLOVE EXAM NITRILE XL STR (GLOVE) IMPLANT
GLOVE EXAM NITRILE XS STR PU (GLOVE) IMPLANT
GOWN BRE IMP SLV AUR LG STRL (GOWN DISPOSABLE) ×2 IMPLANT
GOWN BRE IMP SLV AUR XL STRL (GOWN DISPOSABLE) ×2 IMPLANT
GOWN STRL REIN 2XL LVL4 (GOWN DISPOSABLE) ×2 IMPLANT
HEMOSTAT SURGICEL 2X14 (HEMOSTASIS) ×2 IMPLANT
HOOK DURA (MISCELLANEOUS) ×2 IMPLANT
KIT BASIN OR (CUSTOM PROCEDURE TRAY) ×2 IMPLANT
KIT DRAIN CSF ACCUDRAIN (MISCELLANEOUS) IMPLANT
KIT ROOM TURNOVER OR (KITS) ×2 IMPLANT
MARKER SPHERE PSV REFLC NDI (MISCELLANEOUS) ×4 IMPLANT
NEEDLE HYPO 25X1 1.5 SAFETY (NEEDLE) ×2 IMPLANT
NEEDLE SPNL 18GX3.5 QUINCKE PK (NEEDLE) IMPLANT
NS IRRIG 1000ML POUR BTL (IV SOLUTION) ×6 IMPLANT
PACK CRANIOTOMY (CUSTOM PROCEDURE TRAY) ×2 IMPLANT
PAD EYE OVAL STERILE LF (GAUZE/BANDAGES/DRESSINGS) IMPLANT
PATTIES SURGICAL .25X.25 (GAUZE/BANDAGES/DRESSINGS) IMPLANT
PATTIES SURGICAL .5 X.5 (GAUZE/BANDAGES/DRESSINGS) IMPLANT
PATTIES SURGICAL .5 X3 (DISPOSABLE) ×2 IMPLANT
PATTIES SURGICAL 1/4 X 3 (GAUZE/BANDAGES/DRESSINGS) IMPLANT
PATTIES SURGICAL 1X1 (DISPOSABLE) IMPLANT
PERFORATOR LRG  14-11MM (BIT) ×1
PERFORATOR LRG 14-11MM (BIT) ×1 IMPLANT
PLATE 1.5/0.5 18.5MM BURR HOLE (Plate) ×8 IMPLANT
RUBBERBAND STERILE (MISCELLANEOUS) ×4 IMPLANT
SCREW SELF DRILL HT 1.5/4MM (Screw) ×28 IMPLANT
SET TUBING W/EXT DISP (INSTRUMENTS) ×2 IMPLANT
SPECIMEN JAR SMALL (MISCELLANEOUS) IMPLANT
SPONGE GAUZE 4X4 12PLY (GAUZE/BANDAGES/DRESSINGS) ×2 IMPLANT
SPONGE NEURO XRAY DETECT 1X3 (DISPOSABLE) ×2 IMPLANT
SPONGE SURGIFOAM ABS GEL 100 (HEMOSTASIS) ×4 IMPLANT
STAPLER VISISTAT 35W (STAPLE) ×4 IMPLANT
STOCKINETTE 6  STRL (DRAPES) ×1
STOCKINETTE 6 STRL (DRAPES) ×1 IMPLANT
SUT ETHILON 3 0 FSL (SUTURE) IMPLANT
SUT ETHILON 3 0 PS 1 (SUTURE) IMPLANT
SUT NURALON 4 0 TR CR/8 (SUTURE) ×4 IMPLANT
SUT SILK 0 TIES 10X30 (SUTURE) ×2 IMPLANT
SUT VIC AB 0 CT1 18XCR BRD8 (SUTURE) ×2 IMPLANT
SUT VIC AB 0 CT1 8-18 (SUTURE) ×2
SUT VIC AB 2-0 CT2 18 VCP726D (SUTURE) ×4 IMPLANT
SUT VIC AB 3-0 SH 8-18 (SUTURE) ×8 IMPLANT
SYR 20ML ECCENTRIC (SYRINGE) ×2 IMPLANT
SYR CONTROL 10ML LL (SYRINGE) ×2 IMPLANT
TIP SONASTAR STD MISONIX 1.9 (TRAY / TRAY PROCEDURE) IMPLANT
TIP STRAIGHT 25KHZ (INSTRUMENTS) ×2 IMPLANT
TOWEL OR 17X24 6PK STRL BLUE (TOWEL DISPOSABLE) ×2 IMPLANT
TOWEL OR 17X26 10 PK STRL BLUE (TOWEL DISPOSABLE) ×2 IMPLANT
TRAY FOLEY CATH 14FRSI W/METER (CATHETERS) ×2 IMPLANT
TUBE CONNECTING 12X1/4 (SUCTIONS) ×2 IMPLANT
UNDERPAD 30X30 INCONTINENT (UNDERPADS AND DIAPERS) IMPLANT
WATER STERILE IRR 1000ML POUR (IV SOLUTION) ×2 IMPLANT

## 2013-08-13 NOTE — Progress Notes (Signed)
OT Cancellation Note  Patient Details Name: [name] MRN: [id number] DOB: May 08, 1928   Cancelled Treatment:    Reason Eval/Treat Not Completed: Other (comment) (pt NPO for R frontal tumor resection today)  Carri Spillers A 08/13/2013, 9:02 AM

## 2013-08-13 NOTE — Progress Notes (Signed)
PT Cancellation Note  Patient Details Name: YARNELL KOZLOSKI MRN: [id number] DOB: 1927-12-09   Cancelled Treatment:    Reason Eval/Treat Not Completed: Patient at procedure or test/unavailable. Patient scheduled for OR today. Will follow up in AM as appropriate   Robinette, Adline Potter 08/13/2013, 10:46 AM 08/13/2013 Fredrich Birks PTA [phone] pager [phone] office

## 2013-08-13 NOTE — Anesthesia Postprocedure Evaluation (Signed)
Anesthesia Post Note  Patient: [name]  Procedure(s) Performed: Procedure(s) (LRB): RIGHT FRONTAL CRANIOTOMY FOR RESECTION OF TUMOR (Right)  Anesthesia type: General  Patient location: PACU  Post pain: Pain level controlled and Adequate analgesia  Post assessment: Post-op Vital signs reviewed, Patient's Cardiovascular Status Stable, Respiratory Function Stable, Patent Airway and Pain level controlled  Last Vitals:  Filed Vitals:   08/13/13 1555  BP: 110/45  Pulse: 73  Temp: 36 C  Resp: 17    Post vital signs: Reviewed and stable  Level of consciousness: awake, alert  and oriented  Complications: No apparent anesthesia complications

## 2013-08-13 NOTE — Preoperative (Signed)
Beta Blockers   Reason not to administer Beta Blockers:Not Applicable 

## 2013-08-13 NOTE — Anesthesia Preprocedure Evaluation (Signed)
Anesthesia Evaluation  Patient identified by MRN, date of birth, ID band Patient awake    Reviewed: Allergy & Precautions, H&P , NPO status , Patient's Chart, lab work & pertinent test results  Airway Mallampati: II  Neck ROM: full    Dental   Pulmonary          Cardiovascular hypertension, + Peripheral Vascular Disease     Neuro/Psych  Headaches, Depression Right frontal brain tumor CVA, No Residual Symptoms    GI/Hepatic PUD, GERD-  ,  Endo/Other  Hypothyroidism   Renal/GU      Musculoskeletal  (+) Arthritis -,   Abdominal   Peds  Hematology   Anesthesia Other Findings   Reproductive/Obstetrics                           Anesthesia Physical Anesthesia Plan  ASA: II  Anesthesia Plan: General   Post-op Pain Management:    Induction: Intravenous  Airway Management Planned: Oral ETT  Additional Equipment: Arterial line and CVP  Intra-op Plan:   Post-operative Plan: Extubation in OR  Informed Consent: I have reviewed the patients History and Physical, chart, labs and discussed the procedure including the risks, benefits and alternatives for the proposed anesthesia with the patient or authorized representative who has indicated his/her understanding and acceptance.     Plan Discussed with: CRNA, Anesthesiologist and Surgeon  Anesthesia Plan Comments:         Anesthesia Quick Evaluation

## 2013-08-13 NOTE — Progress Notes (Addendum)
Subjective: The patient notes that her left arm and leg strength are about the same as yesterday, significantly improved compared to admission.  Per neurosurgery, plan for tumor resection this AM.  Patient appears mildly nervous this morning, family present in room.  Objective: Current vital signs: BP 133/70  Pulse 56  Temp(Src) 97.4 F (36.3 C) (Oral)  Resp 20  Ht 5' 4.8" (1.646 m)  Wt 175 lb 4.3 oz (79.5 kg)  BMI 29.34 kg/m2  SpO2 96%  Neurologic Exam: Mental Status:  A&O x3, speech fluent, no aphasia.  Pt follows commands with minimal difficulty Cranial Nerves:  II - visual fields intact, PERRL III/IV/VI - EOMI  V/VII - no facial numbness or weakness  VIII - hearing intact IX,X - palate elevates bilaterally, normal speech XI: trapezius strength/neck flexion strength normal bilaterally XII: tongue strength normal  Motor: strength 5/5 in right upper and lower extremities.  Strength 5-/5 in left upper extremity.  Strength 5-/5 in left lower extremity.  Normal muscle bulk and tone Sensory: intact Deep Tendon Reflexes: 2+ bilaterally Cerebellar: finger-to-nose test intact on right, though somewhat slower on left   Medications: I have reviewed the patient's current medications. Scheduled Meds: . amitriptyline  25 mg Oral QHS  . dexamethasone  4 mg Intravenous Q6H  . docusate sodium  100 mg Oral BID  . levothyroxine  100 mcg Oral QAC breakfast  . losartan  50 mg Oral Daily  . pantoprazole  40 mg Oral BID  . simvastatin  20 mg Oral q1800  . sodium chloride  3 mL Intravenous Q12H   Continuous Infusions:  PRN Meds:.acetaminophen, acetaminophen, ALPRAZolam, HYDROcodone-acetaminophen, methocarbamol, ondansetron (ZOFRAN) IV, ondansetron   Assessment/Plan: The patient is an 79 yo woman, presenting with a large right-sided brain tumor with significant surrounding edema and some herniation.  CT chest/abd/pelvis showed no signs of tumor in other locations.  The cause of this tumor is  unknown, but GBM is a possibility.  Of note, the patient has a history of thyroid cancer, s/p resection and radioiodine.  Recoomendations: 1.  Agree with plan for resection of tumor today, per neurosurgery 2.  Continue decadron  Elnora Morrison, PGY3 Pgr. [phone] 08/13/2013  9:15 AM  Patient seen and examined with above resident.  Clinical course and management discussed.  Necessary edits performed.  I agree with the above.     Alexis Goodell, MD [facility] [phone]  08/13/2013  11:55 AM

## 2013-08-13 NOTE — Transfer of Care (Signed)
Immediate Anesthesia Transfer of Care Note  Patient: [name]  Procedure(s) Performed: Procedure(s) with comments: RIGHT FRONTAL CRANIOTOMY FOR RESECTION OF TUMOR (Right) - right  Patient Location: PACU  Anesthesia Type:General  Level of Consciousness: patient cooperative and responds to stimulation  Airway & Oxygen Therapy: Patient Spontanous Breathing and Patient connected to face mask oxygen  Post-op Assessment: Report given to PACU RN, Post -op Vital signs reviewed and stable and Pt moves R side extremitites, sluggish on L side.  Dr. Conchita Paris aware, at [facility].  Sx consistent with preop assessment.  Post vital signs: Reviewed and stable  Complications: No apparent anesthesia complications

## 2013-08-13 NOTE — Progress Notes (Signed)
[facility] PROGRESS NOTE  [name] WUJ:[id number] DOB: Jan 12, 1928 DOA: 08/10/2013 PCP: Rene Paci, MD  Assessment/Plan: Hemiplegia affecting nondominant side   Brain mass- has history of malignant thyroid; Decadron 4 mg every 6 hours, neuro checks,  CT scan of the abdomen and chest: no source of primary mass  - Holding Plavix which used to be taken for history of CVA -neurosurgery plans for resection on 12/18  HTN- stable  Hypothyroid- home meds  HLD- stable    Code Status: full Family Communication: patient Disposition Plan:    Consultants:  Neurology  neurosurgery  Procedures:    Antibiotics:    HPI/Subjective: Moving arms and legs better  Objective: Filed Vitals:   08/13/13 0604  BP: 133/70  Pulse: 56  Temp: 97.4 F (36.3 C)  Resp: 20    Intake/Output Summary (Last 24 hours) at 08/13/13 0954 Last data filed at 08/12/13 2255  Gross per 24 hour  Intake    723 ml  Output      0 ml  Net    723 ml   Filed Weights   08/11/13 0052  Weight: 79.5 kg (175 lb 4.3 oz)    Exam:   General:  Pleasant/cooperative, NAD  Cardiovascular: rrr  Respiratory: clear anterior  Abdomen: +BS, soft  Musculoskeletal: 4/4 strength   Data Reviewed: Basic Metabolic Panel:  Recent Labs Lab 08/10/13 1929 08/11/13 0414  NA 137 136  K 4.3 4.9  CL 103 103  CO2 26 26  GLUCOSE 84 152*  BUN 17 17  CREATININE 1.27* 1.23*  CALCIUM 9.0 9.2  MG  --  2.4  PHOS  --  2.8   Liver Function Tests:  Recent Labs Lab 08/11/13 0414  AST 18  ALT 14  ALKPHOS 88  BILITOT 0.4  PROT 6.7  ALBUMIN 3.3*   No results found for this basename: LIPASE, AMYLASE,  in the last 168 hours No results found for this basename: AMMONIA,  in the last 168 hours CBC:  Recent Labs Lab 08/10/13 1929 08/11/13 0414  WBC 7.3 7.6  NEUTROABS 4.8  --   HGB 12.1 12.8  HCT 37.0 39.1  MCV 94.9 95.1  PLT 224 225   Cardiac Enzymes: No results found for  this basename: CKTOTAL, CKMB, CKMBINDEX, TROPONINI,  in the last 168 hours BNP (last 3 results) No results found for this basename: PROBNP,  in the last 8760 hours CBG: No results found for this basename: GLUCAP,  in the last 168 hours  Recent Results (from the past 240 hour(s))  URINE CULTURE     Status: None   Collection Time    08/10/13  9:05 PM      Result Value Range Status   Specimen Description URINE, CATHETERIZED   Final   Special Requests NONE   Final   Culture  Setup Time     Final   Value: 08/11/2013 09:04     Performed at [facility] Count     Final   Value: NO GROWTH     Performed at [facility]   Culture     Final   Value: NO GROWTH     Performed at [facility]   Report Status 08/12/2013 FINAL   Final  SURGICAL PCR SCREEN     Status: None   Collection Time    08/12/13 11:30 PM      Result Value Range Status   MRSA, PCR NEGATIVE  NEGATIVE Final  Staphylococcus aureus NEGATIVE  NEGATIVE Final   Comment:            The Xpert SA Assay (FDA     approved for NASAL specimens     in patients over 10 years of age),     is one component of     a comprehensive surveillance     program.  Test performance has     been validated by [employer] for patients greater     than or equal to 43 year old.     It is not intended     to diagnose infection nor to     guide or monitor treatment.     Studies: Ct Abdomen Pelvis Wo Contrast  08/11/2013   CLINICAL DATA:  Recently diagnosed brain neoplasm this study is performed for staging evaluation  EXAM: CT CHEST WITHOUT CONTRAST; CT ABDOMEN AND PELVIS WITHOUT CONTRAST  TECHNIQUE: Multidetector CT imaging of the chest was performed following the standard protocol without IV contrast.; Multidetector CT imaging of the abdomen and pelvis was performed following the standard protocol without intravenous contrast.  COMPARISON:  None.  FINDINGS: Noncontrast evaluation thoracic inlet is  unremarkable.  There is no evidence of mediastinal nor hilar adenopathy nor masses. Trace bilateral pleural effusions are appreciated. A very small hiatal hernia is identified. There is mild-to-moderate multichamber cardiac enlargement. Coronary artery atherosclerotic calcifications are identified as well as atherosclerotic calcifications within the aorta. There is no evidence of a thoracic aortic aneurysm.  There is small bibasilar pleural effusions are identified as well as minimal atelectasis versus trace infiltrates. There is no evidence of a pulmonary masses or nodules. Small calcified pleural plaques are identified posteriorly within the lung apices. There is no evidence of pulmonary nodules, masses, infiltrates. The central airways are patent.  Noncontrast evaluation of the liver demonstrates very small punctate calcified densities within the dome the right lobe of the liver. The liver parenchyma is otherwise unremarkable. Patient status post cholecystectomy. The spleen, adrenals, pancreas, kidneys are unremarkable.  There is no evidence of bowel obstruction. There no secondary signs reflecting enteritis, colitis, diverticulitis, normal appendicitis.  There is no evidence of abdominal or pelvic adenopathy, masses, nor free fluid. The urinary bladder is distended and appears to be filled with urine.  The osseous structures demonstrate multilevel multifactorial degenerative changes throughout the thoracolumbar spine without evidence of aggressive appearing lytic or sclerotic lesions.  There is no evidence of an abdominal aortic aneurysm. Atherosclerotic calcifications are identified within the abdominal aorta. The  IMPRESSION: 1. Trace bilateral pleural effusions with findings likely representing mild areas of atelectasis within the lung bases. Minimal infiltrates and clinically appropriate cannot be excluded. Otherwise no evidence of pulmonary nodules nor masses. 2. There is no evidence of masses nor nodules  within the chest abdomen or pelvis. There is no evidence of pathologic size adenopathy. 3. Moderate amount of fecal retention without evidence of obstructive or inflammatory abnormalities. 4. Small calcified pleural plaques within the lung apices findings may represent sequela prior asbestos exposure. 5. Urinary bladder is dilated consistent with the patient's history of urinary incontinence.   Electronically Signed   By: Salome Holmes M.D.   On: 08/11/2013 13:49   Ct Chest Wo Contrast  08/11/2013   CLINICAL DATA:  Recently diagnosed brain neoplasm this study is performed for staging evaluation  EXAM: CT CHEST WITHOUT CONTRAST; CT ABDOMEN AND PELVIS WITHOUT CONTRAST  TECHNIQUE: Multidetector CT imaging of the chest was performed following  the standard protocol without IV contrast.; Multidetector CT imaging of the abdomen and pelvis was performed following the standard protocol without intravenous contrast.  COMPARISON:  None.  FINDINGS: Noncontrast evaluation thoracic inlet is unremarkable.  There is no evidence of mediastinal nor hilar adenopathy nor masses. Trace bilateral pleural effusions are appreciated. A very small hiatal hernia is identified. There is mild-to-moderate multichamber cardiac enlargement. Coronary artery atherosclerotic calcifications are identified as well as atherosclerotic calcifications within the aorta. There is no evidence of a thoracic aortic aneurysm.  There is small bibasilar pleural effusions are identified as well as minimal atelectasis versus trace infiltrates. There is no evidence of a pulmonary masses or nodules. Small calcified pleural plaques are identified posteriorly within the lung apices. There is no evidence of pulmonary nodules, masses, infiltrates. The central airways are patent.  Noncontrast evaluation of the liver demonstrates very small punctate calcified densities within the dome the right lobe of the liver. The liver parenchyma is otherwise unremarkable. Patient  status post cholecystectomy. The spleen, adrenals, pancreas, kidneys are unremarkable.  There is no evidence of bowel obstruction. There no secondary signs reflecting enteritis, colitis, diverticulitis, normal appendicitis.  There is no evidence of abdominal or pelvic adenopathy, masses, nor free fluid. The urinary bladder is distended and appears to be filled with urine.  The osseous structures demonstrate multilevel multifactorial degenerative changes throughout the thoracolumbar spine without evidence of aggressive appearing lytic or sclerotic lesions.  There is no evidence of an abdominal aortic aneurysm. Atherosclerotic calcifications are identified within the abdominal aorta. The  IMPRESSION: 1. Trace bilateral pleural effusions with findings likely representing mild areas of atelectasis within the lung bases. Minimal infiltrates and clinically appropriate cannot be excluded. Otherwise no evidence of pulmonary nodules nor masses. 2. There is no evidence of masses nor nodules within the chest abdomen or pelvis. There is no evidence of pathologic size adenopathy. 3. Moderate amount of fecal retention without evidence of obstructive or inflammatory abnormalities. 4. Small calcified pleural plaques within the lung apices findings may represent sequela prior asbestos exposure. 5. Urinary bladder is dilated consistent with the patient's history of urinary incontinence.   Electronically Signed   By: Salome Holmes M.D.   On: 08/11/2013 13:49   Mr Laqueta Jean ZO Contrast  08/11/2013   CLINICAL DATA:  Evaluate frontal tumor.  EXAM: MRI HEAD WITHOUT AND WITH CONTRAST  TECHNIQUE: Multiplanar, multiecho pulse sequences of the brain and surrounding structures were obtained without and with intravenous contrast.  CONTRAST:  8mL MULTIHANCE GADOBENATE DIMEGLUMINE 529 MG/ML IV SOLN. Half dose contrast was utilized secondary to patient's recent low GFR.  COMPARISON:  08/10/2013 MR and CTA.  FINDINGS: 5.1 x 4.6 x 4.5 cm anterior  medial right frontal lobe intra-axial appearing partially necrotic mass with peripheral enhancement with marked surrounding vasogenic edema. This causing significant compression of the lateral ventricle inferiorly and to left with 1.1 cm midline shift to left. Findings are suspicious for primary tumor such as glioblastoma. Despite the restricted motion, abscess is felt to be a much less likely consideration. Solitary metastatic lesion felt unlikely although not excluded.  The edema extends into the corpus callosum to left of midline. This surrounding vasogenic edema may contain tumor cells without surrounding enhancement.  Marked compression of the anterior cerebral arteries to left.  Mild small vessel disease type changes.  A tiny amount of hemorrhage within the right frontal mass cannot be excluded. No other areas of intracranial hemorrhage noted.  Small left vertebral artery. Major intracranial vascular  structures are patent.  Degenerative changes C1-2 articulation or transverse ligament hypertrophy.  IMPRESSION: IMPRESSION  5.1 x 4.6 x 4.5 cm anterior medial right frontal lobe intra-axial appearing partially necrotic mass with peripheral enhancement with marked surrounding vasogenic edema. This causing significant compression of the lateral ventricle inferiorly and to left with 1.1 cm midline shift to left. Findings are suspicious for primary tumor such as glioblastoma. Despite the restricted motion, abscess is felt to be a much less likely consideration. Solitary metastatic lesion felt unlikely although not excluded.  The edema extends into the corpus callosum to left of midline. This surrounding vasogenic edema may contain tumor cells without surrounding enhancement.  Marked compression of the anterior cerebral arteries to left.  Despite the broad base along the periphery of the brain, this mass appears intra-axial (as best appreciated on high-resolution slightly motion degraded coronal T2 images).    Electronically Signed   By: Bridgett Larsson M.D.   On: 08/11/2013 21:42    Scheduled Meds: . amitriptyline  25 mg Oral QHS  . dexamethasone  4 mg Intravenous Q6H  . docusate sodium  100 mg Oral BID  . levothyroxine  100 mcg Oral QAC breakfast  . losartan  50 mg Oral Daily  . pantoprazole  40 mg Oral BID  . simvastatin  20 mg Oral q1800  . 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   HYPOTHYROIDISM   HYPERTENSION   Hemiplegia affecting nondominant side   Brain mass    Time spent: 35 min    Marvelyn Bouchillon  [facility] Pager [phone]. If 7PM-7AM, please contact night-coverage at [email or web address], password [facility] 08/13/2013, 9:54 AM  LOS: 3 days

## 2013-08-13 NOTE — Progress Notes (Signed)
CSW order received to assist patient with completion of Health Care Power of [place]. CSW will need clarification of capacity- is she oriented?  Prior notes indicate confusion and document cannot be notarized unless patient is oriented for full understanding of the documents and their content.  MD- Please advise re: this and I will be glad to facilitate.  Thanks!  Lorri Frederick. [place]  [phone]

## 2013-08-13 NOTE — Anesthesia Procedure Notes (Signed)
Procedure Name: Intubation Date/Time: 08/13/2013 11:05 AM Performed by: Sharlene Dory E Pre-anesthesia Checklist: Patient identified, Emergency Drugs available, Suction available, Patient being monitored and Timeout performed Patient Re-evaluated:Patient Re-evaluated prior to inductionOxygen Delivery Method: Circle system utilized Preoxygenation: Pre-oxygenation with 100% oxygen Intubation Type: IV induction Ventilation: Mask ventilation without difficulty Laryngoscope Size: Mac and 3 Grade View: Grade III Tube type: Oral Tube size: 7.5 mm Number of attempts: 1 Airway Equipment and Method: Stylet Placement Confirmation: positive ETCO2 and breath sounds checked- equal and bilateral Secured at: 22 cm Tube secured with: Tape Dental Injury: Teeth and Oropharynx as per pre-operative assessment  Comments: DL x1 with MAC 3, unable to visualize vocal cords d/t neck immobility. AOI on 2nd DL using bougie. +ETCO2 & BBS=.

## 2013-08-13 NOTE — Op Note (Signed)
PREOP DIAGNOSIS: Right frontal tumor   POSTOP DIAGNOSIS: Same  PROCEDURE: 1. Stereotactic right frontal craniotomy for resection of tumor 2. Use of intraoperative microscope for microdissection  SURGEON: Dr. Lisbeth Renshaw, MD  ASSISTANT: Dr. Marikay Alar, MD  ANESTHESIA: General Endotracheal  EBL: 350cc  SPECIMENS: Right frontal tumor for frozen and permanent pathology  DRAINS: None  COMPLICATIONS: None immediate  CONDITION: Hemodynamically stable to post anesthesia care  HISTORY: [name] is a 79 y.o. female initially seen in consultation after admission for confusion and left hemiparesis. Workup included CT scan and MRI of the brain which demonstrated a large ring-enhancing right frontal tumor, and CT chest abdomen and pelvis did not demonstrate any evidence of extracranial tumor. 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 Given these findings, surgical resection for relief of mass effect and diagnosis was indicated. The risks and benefits of the procedure were explained in detail to the patient and her daughters and after all their questions were answered verbal and written consent was obtained and placed in the chart.  PROCEDURE IN DETAIL: After informed consent was obtained and witnessed, the patient was brought to the operating room. After induction of general anesthesia, the patient was positioned on the operative table in the supine position. The Mayfield head holder was then applied to the patient she was positioned appropriately, all pressure points are padded and the Mayfield was affixed to the table. Using the preoperative stereotactic CT scan, surface markers were correct sterile until satisfactory accuracy was achieved. A curvilinear skin incision was then planned out to allow access to the entirety of the tumor, as was the midline. Skin incision was then marked out and  prepped and draped in the usual sterile fashion.  After timeout was conducted, skin incision was made sharply, and Bovie electrocautery was used to dissect the subcutaneous tissue and incised the galea. Raney clips were used for hemostasis and skin edges. The periosteum as well as the temporal fascia and the temporalis muscle was then incised with Bovie, and these single piece my cutaneous flap was elevated and retracted anteriorly. Using the stereotactic system, the superior sagittal sinus was marked out, and a craniotomy was planned to allow access to the entire tumor. Using the perforator bit, multiple bur holes were created along the midline as well as laterally and connected with the craniotome. Craniotomy flap was then elevated with the dura noted to be intact. There was some mild bleeding above the superior sagittal sinus which was controlled with Gelfoam. At this point the microscope was draped sterilely and brought into the field, and the remainder of the case was done under the microscope using microdissection.  The dura was opened in a horseshoe fashion based medially, and reflected medially. The tumor was visible on the brain surface as a discolored mass, slightly darker than the surrounding white matter, and appeared to be slightly hypervascular. The pia was cauterized using bipolar electrocautery and microscissors were used to incise the pia around the tumor and the anterior, lateral, and posterior margins. Using a combination of the ultrasonic aspirator and bipolar electrocautery, the tumor capsule was identified as slightly more firm than the surrounding white matter. The anterior, lateral, and posterior margins of the tumor were then slowly dissected. Attention was then turned to the medial border of the tumor by initially identifying the falx, and the anterior cerebral arteries. The pia along the medial margin of the tumor was then  coagulated and cut. The tumor was then slowly dissected in its  deep margin, and followed medially and noted to be crossing the midline underneath the falx. Once the medial attachment was dissected free, the tumor was removed en bloc. Tumor was then sent for frozen and permanent pathology, and frozen section was consistent with high-grade glioma.  At this point the tumor margins were inspected and and appeared to be free of identifiable tumor.  At this point the wound is irrigated with copious amounts of normal saline irrigation. Good hemostasis was confirmed on the brain surface using a combination of bipolar electrocautery and morcellized Gelfoam and thrombin.. The dura was then closed using a combination of interrupted and continuous 4-0 Nurolon stitches. Dural onlay graft was then placed. Bleeding over the superior sagittal sinus was controlled using Gelfoam.  Muscle was then closed using interrupted 0 Vicryl stitches, and the galea was closed using interrupted 3-0 Vicryl sutures. The skin was closed using standard surgical skin staples. Sterile dressing was then applied after the Mayfield head holder was removed. The patient was then transferred to the stretcher and taken to the PACU in stable hemodynamic condition.  At the end of the case all sponge, needle, and instrument counts were correct.

## 2013-08-14 ENCOUNTER — Inpatient Hospital Stay (HOSPITAL_COMMUNITY): Payer: Medicare Other

## 2013-08-14 LAB — BASIC METABOLIC PANEL
BUN: 25 mg/dL — ABNORMAL HIGH (ref 6–23)
Calcium: 8 mg/dL — ABNORMAL LOW (ref 8.4–10.5)
GFR calc Af Amer: 45 mL/min — ABNORMAL LOW (ref >90)
GFR calc non Af Amer: 39 mL/min — ABNORMAL LOW (ref >90)
Glucose, Bld: 108 mg/dL — ABNORMAL HIGH (ref 70–99)
Potassium: 4.3 mEq/L (ref 3.5–5.1)

## 2013-08-14 LAB — CBC
Hemoglobin: 10.6 g/dL — ABNORMAL LOW (ref 12.0–15.0)
MCHC: 32.4 g/dL (ref 30.0–36.0)
Platelets: 217 10*3/uL (ref 150–400)
RDW: 14.9 % (ref 11.5–15.5)
WBC: 12.3 10*3/uL — ABNORMAL HIGH (ref 4.0–10.5)

## 2013-08-14 MED ORDER — PANTOPRAZOLE SODIUM 40 MG PO TBEC
40.0000 mg | DELAYED_RELEASE_TABLET | Freq: Every day | ORAL | Status: DC
  Administered 2013-08-14 – 2013-08-19 (×6): 40 mg via ORAL
  Filled 2013-08-14 (×4): qty 1

## 2013-08-14 MED ORDER — LEVETIRACETAM 500 MG PO TABS
500.0000 mg | ORAL_TABLET | Freq: Two times a day (BID) | ORAL | Status: DC
  Administered 2013-08-14 – 2013-08-19 (×11): 500 mg via ORAL
  Filled 2013-08-14 (×13): qty 1

## 2013-08-14 MED ORDER — GADOBENATE DIMEGLUMINE 529 MG/ML IV SOLN
15.0000 mL | Freq: Once | INTRAVENOUS | Status: AC
  Administered 2013-08-14: 15 mL via INTRAVENOUS

## 2013-08-14 NOTE — Progress Notes (Signed)
Patient is back in the room from MRI. Patient is alert and stable.

## 2013-08-14 NOTE — Progress Notes (Signed)
CSW order received requesting assistance with HCPOA. Nursing notes report patient having confusion.  Note sent to MD requesting clarification if patient has capacity to understand and be able to complete documents.  CSW will follow.  Lorri Frederick. [place]  [phone]

## 2013-08-14 NOTE — Progress Notes (Signed)
OT Cancellation Note  Patient Details Name: [name] MRN: [id number] DOB: Jan 08, 1928   Cancelled Treatment:    Reason Eval/Treat Not Completed: Other (comment) Pt in transit to 4N. Will attempt to see in am [facility], OTR/L  [phone] 08/14/2013 08/14/2013, 4:59 PM

## 2013-08-14 NOTE — Progress Notes (Signed)
Pt seen and examined. No issues overnight. Pt denies any new c/o, HA under control.No visual changes, SZ.  EXAM: Temp:  [96.8 F (36 C)-99.6 F (37.6 C)] 97.7 F (36.5 C) (12/19 1200) Pulse Rate:  [54-76] 68 (12/19 1400) Resp:  [13-27] 27 (12/19 1400) BP: (100-137)/(42-95) 110/54 mmHg (12/19 1400) SpO2:  [89 %-100 %] 96 % (12/19 1400) Arterial Line BP: (117-178)/(43-68) 142/56 mmHg (12/19 0900) Weight:  [80.3 kg (177 lb 0.5 oz)] 80.3 kg (177 lb 0.5 oz) (12/18 1719) Intake/Output     12/18 0701 - 12/19 0700 12/19 0701 - 12/20 0700   P.O.     I.V. (mL/kg) 2200 (27.4)    Other 20    IV Piggyback 210    Total Intake(mL/kg) 2430 (30.3)    Urine (mL/kg/hr) 2935 (1.5) 330 (0.5)   Blood 350 (0.2)    Total Output 3285 330   Net -855 -330         Awake, alert, oriented CN Intact 5/5 RUE/RLE 4/5 LUE/LLE Wound c/d/i  LABS: Lab Results  Component Value Date   CREATININE 1.24* 08/14/2013   BUN 25* 08/14/2013   NA 142 08/14/2013   K 4.3 08/14/2013   CL 111 08/14/2013   CO2 24 08/14/2013   Lab Results  Component Value Date   WBC 12.3* 08/14/2013   HGB 10.6* 08/14/2013   HCT 32.7* 08/14/2013   MCV 95.9 08/14/2013   PLT 217 08/14/2013    IMAGING: Postop MRI Pending  IMPRESSION: - 79 y.o. female POD# 1 s/p right frontal craniotomy for likely HGG - Neurologically at baseline with stable left hemiparesis  PLAN: - Transfer to neuroscience floor - Cont decadron 4q6 x 3 more days, then can taper  - Cont keppra 500mg  PO BID - Begin PT/OT and start D/C Planning - Will need evaluation by medical oncology and radiation oncology either as inpatient or outpatient.

## 2013-08-14 NOTE — Progress Notes (Signed)
Physical Therapy Treatment Patient Details Name: [name] MRN: [id number] DOB: [birth year] Today's Date: 08/14/2013 Time: 4098-1191 PT Time Calculation (min): 38 min  PT Assessment / Plan / Recommendation  History of Present Illness HPI: [name] is a 79 y.o. female with a history of increasing confusion and weakness over the past few months who was found in the ED to have a large right frontal mass. She has been making errors with her checking account and acting confused as well. She presented today after a home health nurse checked on her and found her to be weak on the left and advised her to come to the ED.   PT Comments   Patient limited by cognitive status and easily distracted throughout treatment needing redirection and cues.  Will most definitely need HHPT and 24/5 supervision for safety at d/c.  Follow Up Recommendations  Home health PT;Supervision/Assistance - 24 hour     Does the patient have the potential to tolerate intense rehabilitation   N/a  Barriers to Discharge  None      Equipment Recommendations  Rolling walker with 5" wheels    Recommendations for Other Services  None  Frequency Min 4X/week   Progress towards PT Goals Progress towards PT goals: Progressing toward goals  Plan Current plan remains appropriate    Precautions / Restrictions Precautions Precautions: Fall   Pertinent Vitals/Pain Denies pain currently; + mild nose bleed during tx    Mobility  Bed Mobility Supine to Sit: 3: Mod assist Sitting - Scoot to Edge of Bed: 3: Mod assist Details for Bed Mobility Assistance: increased time, multimodal cues and increased time given for motor responses Transfers Sit to Stand: 4: Min assist;3: Mod assist;From bed Stand to Sit: To chair/3-in-1;With upper extremity assist;4: Min assist;To toilet Details for Transfer Assistance: cues for safety, hand placement, at times assist for left hand on/off walker or grabbar or chair   Ambulation/Gait Ambulation/Gait Assistance: 3: Mod assist Ambulation Distance (Feet): 30 Feet (x 2) Assistive device: Rolling walker Ambulation/Gait Assistance Details: max cues and guidance for direction/wayfinding to bathroom and back to chair.  Perseverates using toilet and washing hands needing guidance to follow through to next activity; decreased awarenss left side Gait Pattern: Step-through pattern;Shuffle;Decreased stride length     PT Goals (current goals can now be found in the care plan section)    Visit Information  Last PT Received On: 08/14/13 Assistance Needed: +1 History of Present Illness: HPI: [name] is a 79 y.o. female with a history of increasing confusion and weakness over the past few months who was found in the ED to have a large right frontal mass. She has been making errors with her checking account and acting confused as well. She presented today after a home health nurse checked on her and found her to be weak on the left and advised her to come to the ED.    Subjective Data      Cognition  Cognition Arousal/Alertness: Awake/alert Behavior During Therapy: WFL for tasks assessed/performed Overall Cognitive Status: Impaired/Different from baseline Area of Impairment: Safety/judgement;Awareness;Problem solving Memory: Decreased recall of precautions;Decreased short-term memory Safety/Judgement: Decreased awareness of deficits Awareness: Intellectual Problem Solving: Requires verbal cues;Requires tactile cues;Slow processing General Comments: hyperverbal    Balance  Static Sitting Balance Static Sitting - Balance Support: Feet supported Static Sitting - Level of Assistance: 5: Stand by assistance Static Sitting - Comment/# of Minutes: sitting on toilet  End of Session PT - End of Session  Equipment Utilized During Treatment: Gait belt Activity Tolerance: Patient tolerated treatment well Patient left: in chair;with call bell/phone within reach  (RN aware)   GP     WYNN,CYNDI 08/14/2013, 2:58 PM  Sheran Lawless, PT [phone] 08/14/2013

## 2013-08-15 DIAGNOSIS — C719 Malignant neoplasm of brain, unspecified: Secondary | ICD-10-CM | POA: Diagnosis present

## 2013-08-15 DIAGNOSIS — I10 Essential (primary) hypertension: Secondary | ICD-10-CM

## 2013-08-15 DIAGNOSIS — E785 Hyperlipidemia, unspecified: Secondary | ICD-10-CM

## 2013-08-15 DIAGNOSIS — G939 Disorder of brain, unspecified: Secondary | ICD-10-CM

## 2013-08-15 MED ORDER — TRAMADOL-ACETAMINOPHEN 37.5-325 MG PO TABS
2.0000 | ORAL_TABLET | Freq: Four times a day (QID) | ORAL | Status: DC | PRN
  Administered 2013-08-15 (×2): 2 via ORAL
  Filled 2013-08-15 (×2): qty 2

## 2013-08-15 NOTE — Progress Notes (Signed)
Filed Vitals:   08/14/13 1642 08/14/13 2120 08/15/13 0202 08/15/13 0607  BP: 127/63 152/69 161/75 153/74  Pulse: 61 63 55 60  Temp: 98.6 F (37 C) 97.5 F (36.4 C) 97.7 F (36.5 C) 98 F (36.7 C)  TempSrc: Oral Oral Oral Oral  Resp: 18 18 18 18   Height: 5\' 4"  (1.626 m)     Weight: 80.1 kg (176 lb 9.4 oz)     SpO2: 100% 97% 98% 99%    CBC  Recent Labs  08/14/13 0600  WBC 12.3*  HGB 10.6*  HCT 32.7*  PLT 217   BMET  Recent Labs  08/14/13 0600  NA 142  K 4.3  CL 111  CO2 24  GLUCOSE 108*  BUN 25*  CREATININE 1.24*  CALCIUM 8.0*    Patient resting comfortably in bed. Denies headache. Mild left hemiparesis. Surgical pathology describes glioblastoma multiform.  Plan: Doing well following craniotomy 2 days ago. Plans per Dr. Val Riles 08/14/13 note. Will need followup with him in his office in about a week and a half.   Hewitt Shorts, MD 08/15/2013, 9:36 AM

## 2013-08-15 NOTE — Progress Notes (Signed)
Subjective: Patient alert and awake.  Complains of headache.  Reports that she feels her left sided weakness is worse since surgery.  Surgical path shows a grade IV GBM.  Objective: Current vital signs: BP 146/81  Pulse 68  Temp(Src) 98.1 F (36.7 C) (Oral)  Resp 18  Ht 5\' 4"  (1.626 m)  Wt 80.1 kg (176 lb 9.4 oz)  BMI 30.30 kg/m2  SpO2 100% Vital signs in last 24 hours: Temp:  [97.5 F (36.4 C)-98.6 F (37 C)] 98.1 F (36.7 C) (12/20 0951) Pulse Rate:  [55-69] 68 (12/20 0951) Resp:  [18-27] 18 (12/20 0951) BP: (110-161)/(54-81) 146/81 mmHg (12/20 0951) SpO2:  [94 %-100 %] 100 % (12/20 0951) Weight:  [80.1 kg (176 lb 9.4 oz)] 80.1 kg (176 lb 9.4 oz) (12/19 1642)  Intake/Output from previous day: 12/19 0701 - 12/20 0700 In: 120 [P.O.:120] Out: 330 [Urine:330] Intake/Output this shift:   Nutritional status: Cardiac  Neurologic Exam: Mental Status:  A&O x3, speech fluent, no aphasia. Pt follows commands with minimal difficulty. ? Mild left neglect  Cranial Nerves:  II - visual fields intact, PERRL  III/IV/VI - EOMI  V/VII - no facial numbness or weakness  VIII - hearing intact  IX,X - palate elevates bilaterally, normal speech  XI: trapezius strength/neck flexion strength normal bilaterally  XII: tongue strength normal  Motor: strength 5/5 in right upper and lower extremities. Strength 5-/5 in left upper extremity. Strength 5-/5 in left lower extremity. Normal muscle bulk and tone  Sensory: intact  Deep Tendon Reflexes: 2+ bilaterally  Cerebellar: finger-to-nose test intact on right, though somewhat slower on left    Lab Results: Basic Metabolic Panel:  Recent Labs Lab 08/10/13 1929 08/11/13 0414 08/14/13 0600  NA 137 136 142  K 4.3 4.9 4.3  CL 103 103 111  CO2 26 26 24   GLUCOSE 84 152* 108*  BUN 17 17 25*  CREATININE 1.27* 1.23* 1.24*  CALCIUM 9.0 9.2 8.0*  MG  --  2.4  --   PHOS  --  2.8  --     Liver Function Tests:  Recent Labs Lab  08/11/13 0414  AST 18  ALT 14  ALKPHOS 88  BILITOT 0.4  PROT 6.7  ALBUMIN 3.3*   No results found for this basename: LIPASE, AMYLASE,  in the last 168 hours No results found for this basename: AMMONIA,  in the last 168 hours  CBC:  Recent Labs Lab 08/10/13 1929 08/11/13 0414 08/14/13 0600  WBC 7.3 7.6 12.3*  NEUTROABS 4.8  --   --   HGB 12.1 12.8 10.6*  HCT 37.0 39.1 32.7*  MCV 94.9 95.1 95.9  PLT 224 225 217    Cardiac Enzymes: No results found for this basename: CKTOTAL, CKMB, CKMBINDEX, TROPONINI,  in the last 168 hours  Lipid Panel: No results found for this basename: CHOL, TRIG, HDL, CHOLHDL, VLDL, LDLCALC,  in the last 168 hours  CBG: No results found for this basename: GLUCAP,  in the last 168 hours  Microbiology: Results for orders placed during the hospital encounter of 08/10/13  URINE CULTURE     Status: None   Collection Time    08/10/13  9:05 PM      Result Value Range Status   Specimen Description URINE, CATHETERIZED   Final   Special Requests NONE   Final   Culture  Setup Time     Final   Value: 08/11/2013 09:04     Performed at [facility]  Colony Count     Final   Value: NO GROWTH     Performed at [facility]   Culture     Final   Value: NO GROWTH     Performed at [facility]   Report Status 08/12/2013 FINAL   Final  SURGICAL PCR SCREEN     Status: None   Collection Time    08/12/13 11:30 PM      Result Value Range Status   MRSA, PCR NEGATIVE  NEGATIVE Final   Staphylococcus aureus NEGATIVE  NEGATIVE Final   Comment:            The Xpert SA Assay (FDA     approved for NASAL specimens     in patients over 45 years of age),     is one component of     a comprehensive surveillance     program.  Test performance has     been validated by  [employer] for patients greater     than or equal to 85 year old.     It is not intended     to diagnose infection nor to     guide or monitor treatment.     Coagulation Studies:  Recent Labs  08/12/13 1445  LABPROT 13.1  INR 1.01    Imaging: Mr Jeri Cos QI Contrast  08/15/2013   CLINICAL DATA:  Followup stereotactic right frontal craniotomy for resection of tumor  EXAM: MRI HEAD WITHOUT AND WITH CONTRAST  TECHNIQUE: Multiplanar, multiecho pulse sequences of the brain and surrounding structures were obtained without and with intravenous contrast.  CONTRAST:  80mL MULTIHANCE GADOBENATE DIMEGLUMINE 529 MG/ML IV SOLN  COMPARISON:  Prior MRI from 08/11/2013 and CT from 08/13/2013  FINDINGS: Postoperative changes from interval right frontal craniotomy are seen. Skin staples overlie the right frontal scalp. There is swelling and edema within the scalp soft tissues overlying the craniotomy site. T2 hyperintense signal intensity with scattered blood products are seen within the surgical resection bed within the right frontal lobe. A 1.6 x 1.3 cm T1 hyperintense, T2 hypointense nonenhancing ovoid region seen at the lateral margin the resection cavity likely represents a small hematoma. Peripheral hypo intense signal intensity seen about this area on the gradient echo sequence. Hypointense signal intensity seen layering along the primary resection cavity on gradient echo sequence is also compatible with blood products. There is increased prominence of the extra axial space overlying the right frontal lobe as compared to prior exam, likely related to tumor debulking/resection. No extra-axial fluid collection/hematoma identified.  There is mild peripheral post-contrast enhancement about the posterior and lateral margin of the resection cavity, most evident on series 11, image 45. This may be related to postoperative changes. No definite residual tumor is identified. Enhancement of the overlying dura is also present, likely reactive. Associated vasogenic edema within the right frontal lobe persists but is overall slightly improved relative to the prior exam. Edema is  again seen crossing the corpus callosum. Secondary right-to-left midline shift is improved now measuring approximately 1.2 cm, previously 1.8 cm. The basilar cisterns remain patent. No hydrocephalus.  A 7 mm focus of restricted diffusion is seen at the posterior margin of the resection cavity in the right frontal lobe (series 5, image 24), consistent with infarct. In addition, serpiginous restricted diffusion is seen along the posterolateral margin of the resection cavity (series 5, image 25). No other diffusion abnormality identified. Normal flow voids are seen within the  intracranial vasculature.  No new mass or other acute abnormality identified within the brain.  Paranasal sinuses and mastoid air cells remain clear.  IMPRESSION: 1. Postoperative changes from interval right frontal craniotomy with resection of right frontal lobe mass without complication. 2. Mild peripheral rim enhancement about the resection cavity, most prevalent along its posteroateral margin, which may be postoperative in nature. No definite residual tumor identified. This study will serve as a baseline for future examinations. 3. Persistent but mildly improved vasogenic edema throughout the right frontal lobe with slightly improved right-to-left midline shift now measuring 1.2 cm, previously 1.8 cm. No hydrocephalus. 4. Restricted diffusion along the posterolateral margin of the resection cavity within the right frontal lobe, consistent with small infarcts.   Electronically Signed   By: Jeannine Boga M.D.   On: 08/15/2013 04:56    Medications:  I have reviewed the patient's current medications. Scheduled: . amitriptyline  25 mg Oral QHS  . dexamethasone  4 mg Intravenous Q6H  . docusate sodium  100 mg Oral BID  . heparin subcutaneous  5,000 Units Subcutaneous Q8H  . levETIRAcetam  500 mg Oral BID  . levothyroxine  100 mcg Oral QAC breakfast  . losartan  50 mg Oral Daily  . pantoprazole  40 mg Oral Daily  . simvastatin   20 mg Oral q1800  . sodium chloride  3 mL Intravenous Q12H    Assessment/Plan: Patient s/p surgical resection of GBM.  On Decadron.  Strength on left stable-some possible neglect noted.    Recommendations: 1.  Oncology consult   LOS: 5 days   Alexis Goodell, MD [facility] [phone] 08/15/2013  1:25 PM

## 2013-08-15 NOTE — Consult Note (Signed)
#   [id number] is consult note.  She clearly needs adjuvant XRT.  I will call Rad Onc on Monday.  They can see her as an outpatient.  Need to have path run MGMT analysis.  This will dictate whether or not she would benefit from Temodar with XRT.  At 79yo, we must be cautious with how aggressive we need to be.  I doubt this is a curable situation, even though Dr. Conchita Paris did a great job in resecting out this tumor!!!  We had an excellent prayer session.  [place] to church [place] from [place] church in [place].  Pete E.  Isaiah 41:10

## 2013-08-15 NOTE — Progress Notes (Addendum)
[facility] PROGRESS NOTE  [name] WGN:[id number] DOB: 03-17-1928 DOA: 08/10/2013 PCP: Rene Paci, MD  Brief narrative 79 year old female with history of CVA, arthritis, depression, degenerative disc disease, GERD, hypertension, hypothyroidism, typical for disease, papillary thyroid carcinoma and osteoporosis who presented with progressively sided weakness over past 5 weeks associated with frontal headaches. She also started having frequent falls recently. She was found by her home health nurse to have weakness on her left side and was sent to the ED. A CT scan of the head done in the ED showed a right frontal lobe mass. MRI of the brain was done which showed a large medial right frontal mass with some midline shift. She was started on Decadron. Patient seen by neurosurgery and taken to OR on 12/18 for resection of the brain mass. Biopsy showing glioblastoma.   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 Followup MRI shows  persistent vasogenic edema with improvement in the right-to-left midline shift. Continue current dose of Decadron for now with plan to taper from 12/22 -appreciate onc consult.recommends adjuvant chemoradiation. Will involve radiation oncology.  -PT evaluation recommends CIR consult -Pain control with Vicodin and Ultracet -Continue Keppra for seizure prophylaxis. -PPI for GI prophylaxis.   Anxiety/depression Continue Xanax and Elavil  Hypertension Continue losartan   Hypothyroidism Continue Synthroid  Hyperlipidemia Continue Zocor     Code Status: Full code Family Communication: None at bedside Disposition Plan: CIR vs SNF   Consultants:  Neurology  Neurosurgery  Oncology  Procedures:  craniotomy with resection of right frontal lobe mass on 12/18  Antibiotics:  None  HPI/Subjective: Headaches improved. left Sided  weakness improving.  Objective: Filed Vitals:   08/15/13 1339  BP: 130/76  Pulse: 73  Temp: 98.5 F (36.9 C)  Resp: 20    Intake/Output Summary (Last 24 hours) at 08/15/13 1547 Last data filed at 08/14/13 1824  Gross per 24 hour  Intake    120 ml  Output      0 ml  Net    120 ml   Filed Weights   08/11/13 0052 08/13/13 1719 08/14/13 1642  Weight: 79.5 kg (175 lb 4.3 oz) 80.3 kg (177 lb 0.5 oz) 80.1 kg (176 lb 9.4 oz)    Exam:   General:  Elderly female lying in bed in no acute distress  HEENT: Staples over right craniotomy, No pallor, moist oral mucosa  Cardiovascular: Normal S1 and S2, no murmurs or gallop  Chest: Clear to auscultation bilaterally, no added sounds  Abdomen: Soft, nontender, nondistended, bowel sounds present  Extremities: Warm, no edema  CNS: AAO x3, power over left extremity 4+/5   Data Reviewed: Basic Metabolic Panel:  Recent Labs Lab 08/10/13 1929 08/11/13 0414 08/14/13 0600  NA 137 136 142  K 4.3 4.9 4.3  CL 103 103 111  CO2 26 26 24   GLUCOSE 84 152* 108*  BUN 17 17 25*  CREATININE 1.27* 1.23* 1.24*  CALCIUM 9.0 9.2 8.0*  MG  --  2.4  --   PHOS  --  2.8  --    Liver Function Tests:  Recent Labs Lab 08/11/13 0414  AST 18  ALT 14  ALKPHOS 88  BILITOT 0.4  PROT 6.7  ALBUMIN 3.3*   No results found for this basename: LIPASE, AMYLASE,  in the last 168 hours No results found for this basename: AMMONIA,  in the last 168 hours CBC:  Recent Labs Lab  08/10/13 1929 08/11/13 0414 08/14/13 0600  WBC 7.3 7.6 12.3*  NEUTROABS 4.8  --   --   HGB 12.1 12.8 10.6*  HCT 37.0 39.1 32.7*  MCV 94.9 95.1 95.9  PLT 224 225 217   Cardiac Enzymes: No results found for this basename: CKTOTAL, CKMB, CKMBINDEX, TROPONINI,  in the last 168 hours BNP (last 3 results) No results found for this basename: PROBNP,  in the last 8760 hours CBG: No results found for this basename: GLUCAP,  in the last 168 hours  Recent Results (from the  past 240 hour(s))  URINE CULTURE     Status: None   Collection Time    08/10/13  9:05 PM      Result Value Range Status   Specimen Description URINE, CATHETERIZED   Final   Special Requests NONE   Final   Culture  Setup Time     Final   Value: 08/11/2013 09:04     Performed at [facility] Count     Final   Value: NO GROWTH     Performed at [facility]   Culture     Final   Value: NO GROWTH     Performed at [facility]   Report Status 08/12/2013 FINAL   Final  SURGICAL PCR SCREEN     Status: None   Collection Time    08/12/13 11:30 PM      Result Value Range Status   MRSA, PCR NEGATIVE  NEGATIVE Final   Staphylococcus aureus NEGATIVE  NEGATIVE Final   Comment:            The Xpert SA Assay (FDA     approved for NASAL specimens     in patients over 61 years of age),     is one component of     a comprehensive surveillance     program.  Test performance has     been validated by [employer] for patients greater     than or equal to 46 year old.     It is not intended     to diagnose infection nor to     guide or monitor treatment.     Studies: Mr Laqueta Jean Wo Contrast  08-21-13   CLINICAL DATA:  Followup stereotactic right frontal craniotomy for resection of tumor  EXAM: MRI HEAD WITHOUT AND WITH CONTRAST  TECHNIQUE: Multiplanar, multiecho pulse sequences of the brain and surrounding structures were obtained without and with intravenous contrast.  CONTRAST:  15mL MULTIHANCE GADOBENATE DIMEGLUMINE 529 MG/ML IV SOLN  COMPARISON:  Prior MRI from 08/11/2013 and CT from 08/13/2013  FINDINGS: Postoperative changes from interval right frontal craniotomy are seen. Skin staples overlie the right frontal scalp. There is swelling and edema within the scalp soft tissues overlying the craniotomy site. T2 hyperintense signal intensity with scattered blood products are seen within the surgical resection bed within the right frontal lobe. A 1.6 x 1.3 cm  T1 hyperintense, T2 hypointense nonenhancing ovoid region seen at the lateral margin the resection cavity likely represents a small hematoma. Peripheral hypo intense signal intensity seen about this area on the gradient echo sequence. Hypointense signal intensity seen layering along the primary resection cavity on gradient echo sequence is also compatible with blood products. There is increased prominence of the extra axial space overlying the right frontal lobe as compared to prior exam, likely related to tumor debulking/resection. No extra-axial fluid collection/hematoma identified.  There is mild peripheral post-contrast enhancement about the posterior and lateral margin of the resection cavity, most evident on series 11, image 45. This may be related to postoperative changes. No definite residual tumor is identified. Enhancement of the overlying dura is also present, likely reactive. Associated vasogenic edema within the right frontal lobe persists but is overall slightly improved relative to the prior exam. Edema is again seen crossing the corpus callosum. Secondary right-to-left midline shift is improved now measuring approximately 1.2 cm, previously 1.8 cm. The basilar cisterns remain patent. No hydrocephalus.  A 7 mm focus of restricted diffusion is seen at the posterior margin of the resection cavity in the right frontal lobe (series 5, image 24), consistent with infarct. In addition, serpiginous restricted diffusion is seen along the posterolateral margin of the resection cavity (series 5, image 25). No other diffusion abnormality identified. Normal flow voids are seen within the intracranial vasculature.  No new mass or other acute abnormality identified within the brain.  Paranasal sinuses and mastoid air cells remain clear.  IMPRESSION: 1. Postoperative changes from interval right frontal craniotomy with resection of right frontal lobe mass without complication. 2. Mild peripheral rim enhancement about  the resection cavity, most prevalent along its posteroateral margin, which may be postoperative in nature. No definite residual tumor identified. This study will serve as a baseline for future examinations. 3. Persistent but mildly improved vasogenic edema throughout the right frontal lobe with slightly improved right-to-left midline shift now measuring 1.2 cm, previously 1.8 cm. No hydrocephalus. 4. Restricted diffusion along the posterolateral margin of the resection cavity within the right frontal lobe, consistent with small infarcts.   Electronically Signed   By: Rise Mu M.D.   On: 08/15/2013 04:56    Scheduled Meds: . amitriptyline  25 mg Oral QHS  . dexamethasone  4 mg Intravenous Q6H  . docusate sodium  100 mg Oral BID  . heparin subcutaneous  5,000 Units Subcutaneous Q8H  . levETIRAcetam  500 mg Oral BID  . levothyroxine  100 mcg Oral QAC breakfast  . losartan  50 mg Oral Daily  . pantoprazole  40 mg Oral Daily  . simvastatin  20 mg Oral q1800  . sodium chloride  3 mL Intravenous Q12H   Continuous Infusions:     Time spent: 25 minutes    Aijalon Kirtz  [facility] Pager [phone] If 7PM-7AM, please contact night-coverage at [email or web address], password [facility] 08/15/2013, 3:47 PM  LOS: 5 days

## 2013-08-15 NOTE — Progress Notes (Addendum)
Ambulated patient about 30 feet down the hallway with 1 assist, gait belt, and walker. Patient with evident left sided deficits and despite constant reminders could not keep her left leg inside of the walker during ambulation. Back to room and into chair with chair alarm. Call bell and phone in reach. Will cont to monitor

## 2013-08-15 NOTE — Evaluation (Signed)
Occupational Therapy RE- Evaluation Patient Details Name: AVIYA JARVIE MRN: [id number] DOB: 23-Oct-1927 Today's Date: 08/15/2013 Time: 9147-8295 OT Time Calculation (min): 26 min  OT Assessment / Plan / Recommendation History of present illness HPI: ARTHEA NOBEL is a 79 y.o. female with a history of increasing confusion and weakness over the past few months who was found in the ED to have a large right frontal mass. She has been making errors with her checking account and acting confused as well. She presented today after a home health nurse checked on her and found her to be weak on the left and advised her to come to the ED. Pt s/p tumor resection   Clinical Impression   Patient is s/p tumore resection surgery resulting in functional limitations due to the deficits listed below (see OT problem list).  Patient will benefit from skilled OT acutely to increase independence and safety with ADLS to allow discharge CIR. Pt with cognitive deficits, left inattention, dragging LT LE and balance deficits with decr problem solving. Pt is an excellent CIR candidate at this time.     OT Assessment       Follow Up Recommendations  CIR;Supervision/Assistance - 24 hour    Barriers to Discharge      Equipment Recommendations  3 in 1 bedside comode    Recommendations for Other Services    Frequency  Min 2X/week    Precautions / Restrictions Precautions Precautions: Fall   Pertinent Vitals/Pain None reported    ADL  Grooming: Wash/dry hands;Minimal assistance Where Assessed - Grooming: Supported standing Toilet Transfer: Moderate assistance Toilet Transfer Method: Sit to stand Toilet Transfer Equipment: Regular height toilet;Grab bars Toileting - Clothing Manipulation and Hygiene: Moderate assistance Where Assessed - Toileting Clothing Manipulation and Hygiene: Sit to stand from 3-in-1 or toilet Equipment Used: Gait belt;Rolling walker Transfers/Ambulation Related to ADLs: Pt  with urgency to all transfers with Lt Le weakness. Pt dragging Lt LE. pt walking with LT LE outside the RW. Pt with left inattention. pt walking into door frame with RW. Pt with Lt UE wrist flexion and rotation on RW with supination.  ADL Comments: pt unaware of left side deficits.Pt with incr risk of falls with urgency to void bladder. Pt with incontinence. Pt reports constant need to void and odor to her urine. Question UTI? Pt needed cues for hand hygiene    OT Diagnosis:    OT Problem List:   OT Treatment Interventions:     OT Goals(Current goals can be found in the care plan section) Acute Rehab OT Goals Patient Stated Goal: to have better balance OT Goal Formulation: With patient Time For Goal Achievement: 08/29/13 Potential to Achieve Goals: Good ADL Goals Pt Will Perform Grooming: with min guard assist;standing Pt Will Transfer to Toilet: with min guard assist;bedside commode Pt Will Perform Toileting - Clothing Manipulation and hygiene: with min guard assist;sit to/from stand Additional ADL Goal #1: Pt will complete 2 step command without visual cues or repetition of instructions min guard (A)  Visit Information  Last OT Received On: 08/15/13 Assistance Needed: +1 History of Present Illness: HPI: LITZI BINNING is a 79 y.o. female with a history of increasing confusion and weakness over the past few months who was found in the ED to have a large right frontal mass. She has been making errors with her checking account and acting confused as well. She presented today after a home health nurse checked on her and found her to be weak  on the left and advised her to come to the ED. Pt s/p tumor resection       Prior Functioning               Vision/Perception     Cognition  Cognition Arousal/Alertness: Awake/alert Behavior During Therapy: WFL for tasks assessed/performed Overall Cognitive Status: Impaired/Different from baseline Area of Impairment:  Safety/judgement;Awareness;Problem solving Memory: Decreased recall of precautions;Decreased short-term memory Safety/Judgement: Decreased awareness of deficits Awareness: Emergent;Anticipatory Problem Solving: Requires verbal cues;Requires tactile cues;Slow processing General Comments: hyperverbal    Extremity/Trunk Assessment       Mobility Bed Mobility Sit to Supine: 3: Mod assist;HOB flat;With rail Details for Bed Mobility Assistance: required (A) for bil LE and sequence Transfers Transfers: Sit to Stand;Stand to Sit Sit to Stand: 4: Min assist;With upper extremity assist;From chair/3-in-1 Stand to Sit: 4: Min assist;With upper extremity assist;To chair/3-in-1 Details for Transfer Assistance: cues for hand placement     Exercise     Balance     End of Session OT - End of Session Activity Tolerance: Patient tolerated treatment well Patient left: in chair;with call bell/phone within reach Nurse Communication: Mobility status;Precautions  GO     Harolyn Rutherford 08/15/2013, 3:26 PM Pager: [phone]

## 2013-08-16 MED ORDER — HYDRALAZINE HCL 10 MG PO TABS
10.0000 mg | ORAL_TABLET | Freq: Four times a day (QID) | ORAL | Status: DC | PRN
  Filled 2013-08-16: qty 1

## 2013-08-16 NOTE — Consult Note (Signed)
NAMEMarland Kitchen  KHADIJA, THIER NO.:  [id number]  MEDICAL RECORD NO.:  [id number]  LOCATION:  [id number]                        FACILITY:  [facility]  PHYSICIAN:  Volanda Napoleon, M.D.  DATE OF BIRTH:  June 24, 1928  DATE OF CONSULTATION:  08/15/2013 DATE OF DISCHARGE:                                CONSULTATION   REASON FOR CONSULTATION:  Right frontal lobe glioblastoma multiforme.  HISTORY OF PRESENT ILLNESS:  Ms. Billard is an very charming 79 year old, white female.  She is from [facility].  She has been fairly healthy. She is followed by Dr. Asa Lente for Internal Medicine.  She has history of a past CVA.  I do not think she was affected by this.  She has history of depression and hyperlipidemia.  She also some hypothyroidism.  She had a thyroidectomy back in February 2010.  She was found to have some progressive weakness on her left side.  She lives with her daughter.  This has been going on for about 4 weeks or 5 weeks.  She has had a little bit of headache.  She was starting  to have some falling episodes.  A home health nurse came out to see her.  There is some left-sided weakness.  The patient subsequently went to the emergency room.  A CT scan of the head was done on August 10, 2013.  This showed a mass in the right frontal lobe measuring 4 x 3 x 3 cm.  There is no hemorrhage. No other abnormalities were noted.  She was then admitted to [facility].  The patient had MRI on the 16th.  This showed a 5.1 x 4.6 x 4.5 cm medial anterior parietal frontal lobe mass.  This is partially necrotic.  There is peripheral enhancement.  There was surrounding vasogenic edema.  She had significant shift of 1.1 cm.  She was then seen by Neurosurgery.  Dr. Kathyrn Sheriff [facility] [place] [facility] saw her.  He went ahead and did a craniotomy and resected the tumor out.  This was done stereotactically. Although, it is hard to tell from the operative note, it looks  like he may have gotten all the tumor out.  This was done in piece meal fashion. According to the operative report, the tumor was removed en block.  Pathology came back on the tumor.  The pathological report ([id number]) showed a glioblastoma that was grade 4.  She has recovered up pretty nicely.  She still has some left-sided weakness.  She is alert and oriented when I talked to her.  We were asked to see her to establish oncologic follow up and recommendations for care.  She is on Keppra for seizure prophylaxis.  PAST MEDICAL HISTORY:  Remarkable for; 1. Hypertension. 2. Papillary carcinoma of the thyroid, status post hypothyroidism. 3. Depression. 4. TIA/CVA with no residual deficits. 5. Peptic ulcer disease.  ALLERGIES:  Iodine.  ADMISSION MEDICATIONS: 1. Xanax 0.25 mg p.o. t.i.d. p.r.n. 2. Elavil 25 mg p.o. at bedtime. 3. Aspirin 81 mg p.o. daily. 4. Plavix 75 mg p.o. every day. 5. Synthroid 0.1 mg p.o. daily. 6. Cozaar 50 mg p.o. daily. 7. Robaxin 500 mg p.o. q.8 hours p.r.n. 8. Zocor  20 mg p.o. daily. 9. Ultracet (37.5/325) 1 p.o. b.i.d. p.r.n. 10.Protonix 40 mg p.o. b.i.d.  SOCIAL HISTORY:  Negative for I think tobacco or alcohol use.  She has no obvious occupational exposures.  FAMILY HISTORY:  Breast cancer in maternal grandmother.  There is a history of "cancer" in her sister.  REVIEW OF SYSTEMS:  As stated in the history of present illness.  No additional findings noted on 12-system review.  PHYSICAL EXAMINATION:  General:  This is a well-developed, well- nourished, elderly female, in no obvious distress.  Vital Signs: Temperature of 98.5, pulse 73, respiratory rate 20, blood pressure 146/81, weight was 177 pounds.  Head and Neck:  Dressing over the right frontal region of the scalp.  She has some slight ptosis of the right eye.  Her pupils react appropriately.  She has no oral lesions.  There is no mucositis.  She has no adenopathy on her neck.  Lungs:   Clear bilaterally.  Cardiac:  Regular rate and rhythm with normal S1, S2. There are no murmurs, rubs, or bruits.  Abdomen:  Soft.  She has good bowel sounds.  There is no fluid wave.  There is no palpable abdominal mass.  There is no palpable hepatosplenomegaly.  Back:  No tenderness over the spine, ribs, or hips.  Extremities:  Some weakness on the left side.  She has no edema on the left side.  There is some osteoarthritic changes in her joints.  Skin:  No rashes, ecchymosis, or petechia. Neurologic:  Decrease in strength on the left side.  LABORATORY STUDIES:  White cell count 12.3, hemoglobin 10.6, hematocrit 32.7, platelet count 217.  BUN 25, creatinine 1.24.  Her LFTs are normal.  Albumin is 3.3.  IMPRESSION:  Ms. Baldridge is an very charming 79 year old female.  She has a resected glioblastoma multiforme of the right frontal lobe.  Again, it looks like Dr. Kathyrn Sheriff did a fantastic job in resecting this.  It looks like it was resected totally.  She is still at high-risk for recurrence.  She is 79 years old.  Her performance status before her neurological decline was ECOG 1-2.  She will clearly benefit from radiation therapy.  As far as Temodar with radiation, I think this is up for debate.  We will have her tumor sent off for MGMT analysis.  I think if she is positive for the methylation of MGMT, then we might make a case for low- dose Temodar with radiation and possibly full dose Temodar post radiation.  Again, she is 79 years old, so we do have to be careful and consider toxicity with treatment.  She needs to be seen by Radiation Oncology.  However, all this can be done as an outpatient.  I would not expect any radiation to start for another 3-4 weeks.  We will plan to see her as an outpatient, probably in about a month or so ourselves.  I do not see that any additional staging from my point of view that needs to be done.  I think she did have an MRI post  treatment and post surgery.  There was not found to be any obvious residual disease noted.  Again, Dr. Kathyrn Sheriff did a fantastic job with operating on her and resecting out this tumor.  I think this probably was the best thing that could have happened to her and probably the greatest or most important prognostic factor that she will have.  We had an excellent prayer session.  She actually goes  to church very close to where my church is up in [place].  She has a very strong faith.  She understands that this is God's plan and she is certainly not afraid of what is ahead.     Volanda Napoleon, M.D.     PRE/MEDQ  D:  08/15/2013  T:  08/16/2013  Job:  [id number]  cc:   Mateo Flow A. Asa Lente, MD Consuella Lose, MD

## 2013-08-16 NOTE — Progress Notes (Signed)
Filed Vitals:   08/15/13 1811 08/15/13 2200 08/16/13 0127 08/16/13 0548  BP: 151/70 154/70 174/83 172/75  Pulse: 66 60 56 60  Temp: 97.9 F (36.6 C) 98.4 F (36.9 C) 98 F (36.7 C) 98.6 F (37 C)  TempSrc: Oral Oral Oral Oral  Resp: 18 18 18 18   Height:      Weight:      SpO2: 98% 97% 96% 95%    CBC  Recent Labs  08/14/13 0600  WBC 12.3*  HGB 10.6*  HCT 32.7*  PLT 217   BMET  Recent Labs  08/14/13 0600  NA 142  K 4.3  CL 111  CO2 24  GLUCOSE 108*  BUN 25*  CREATININE 1.24*  CALCIUM 8.0*    Patient resting in bed, having a bath by staff. Denies headache. Wound clean dry, healing well. Seen by medical oncology yesterday, and to be seen by radiation oncology per his note.  Plan: Continues to do well following craniotomy 3 days ago. We'll need to followup with him the week of December 29 for staple removal, etc..  Hewitt Shorts, MD 08/16/2013, 9:25 AM

## 2013-08-17 ENCOUNTER — Encounter: Payer: Self-pay | Admitting: Radiation Oncology

## 2013-08-17 ENCOUNTER — Ambulatory Visit
Admit: 2013-08-17 | Discharge: 2013-08-17 | Disposition: A | Discharge status: Home / Self Care | Payer: Medicare Other | Attending: Radiation Oncology | Admitting: Radiation Oncology

## 2013-08-17 DIAGNOSIS — C719 Malignant neoplasm of brain, unspecified: Secondary | ICD-10-CM

## 2013-08-17 LAB — BASIC METABOLIC PANEL
CO2: 23 mEq/L (ref 19–32)
Calcium: 8.6 mg/dL (ref 8.4–10.5)
Creatinine, Ser: 1.25 mg/dL — ABNORMAL HIGH (ref 0.50–1.10)
GFR calc Af Amer: 45 mL/min — ABNORMAL LOW (ref >90)
Glucose, Bld: 143 mg/dL — ABNORMAL HIGH (ref 70–99)
Sodium: 133 mEq/L — ABNORMAL LOW (ref 135–145)

## 2013-08-17 MED ORDER — DEXAMETHASONE 4 MG PO TABS
4.0000 mg | ORAL_TABLET | Freq: Three times a day (TID) | ORAL | Status: DC
  Administered 2013-08-17 – 2013-08-18 (×4): 4 mg via ORAL
  Filled 2013-08-17 (×9): qty 1

## 2013-08-17 NOTE — Progress Notes (Signed)
Agree with PTA.    Marry Kusch, PT [phone]  

## 2013-08-17 NOTE — Progress Notes (Signed)
[facility] PROGRESS NOTE  [name]:[id number] DOB: 07/10/1928 DOA: 08/10/2013 PCP: Rene Paci, MD  Brief narrative  79 year old female with history of CVA, arthritis, depression, degenerative disc disease, GERD, hypertension, hypothyroidism, typical for disease, papillary thyroid carcinoma and osteoporosis who presented with progressively sided weakness over past 5 weeks associated with frontal headaches. She also started having frequent falls recently. She was found by her home health nurse to have weakness on her left side and was sent to the ED. A CT scan of the head done in the ED showed a right frontal lobe mass. MRI of the brain was done which showed a large medial right frontal mass with some midline shift. She was started on Decadron. Patient seen by neurosurgery and taken to OR on 12/18 for resection of the brain mass. Biopsy showing glioblastoma.   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 Followup MRI shows persistent vasogenic edema with improvement in the right-to-left midline shift.  Continue current dose of Decadron for now with plan to taper from 12/22  -appreciate onc consult.recommends adjuvant chemoradiation. Will involve radiation oncology.  -PT evaluation recommends CIR consult  -Pain control with Vicodin and Ultracet  -Continue Keppra for seizure prophylaxis.  -PPI for GI prophylaxis.   Anxiety/depression  Continue Xanax and Elavil   Hypertension  Continue losartan   Hypothyroidism  Continue Synthroid   Hyperlipidemia  Continue Zocor   Code Status: Full code  Family Communication: None at bedside   Disposition Plan: SNF as per family request. SW consulted  Consultants:  Neurology  Neurosurgery  Oncology   Procedures:  craniotomy with resection of right frontal lobe mass on 12/18  Antibiotics:  None   HPI/Subjective:    left Sided weakness minimal   Objective: Filed Vitals:   08/17/13 1040  BP: 152/61  Pulse: 74  Temp: 97.8 F (36.6 C)  Resp: 18   No intake or output data in the 24 hours ending 08/17/13 1149 Filed Weights   08/11/13 0052 08/13/13 1719 08/14/13 1642  Weight: 79.5 kg (175 lb 4.3 oz) 80.3 kg (177 lb 0.5 oz) 80.1 kg (176 lb 9.4 oz)    Exam:  General: Elderly female lying in bed in no acute distress  HEENT: Staples over right craniotomy, No pallor, moist oral mucosa  Cardiovascular: Normal S1 and S2, no murmurs or gallop  Chest: Clear to auscultation bilaterally, no added sounds  Abdomen: Soft, nontender, nondistended, bowel sounds present  Extremities: Warm, no edema  CNS: AAO x3, power over left extremity 4+/5   Data Reviewed: Basic Metabolic Panel:  Recent Labs Lab 08/10/13 1929 08/11/13 0414 08/14/13 0600 08/17/13 0533  NA 137 136 142 133*  K 4.3 4.9 4.3 4.1  CL 103 103 111 101  CO2 26 26 24 23   GLUCOSE 84 152* 108* 143*  BUN 17 17 25* 33*  CREATININE 1.27* 1.23* 1.24* 1.25*  CALCIUM 9.0 9.2 8.0* 8.6  MG  --  2.4  --   --   PHOS  --  2.8  --   --    Liver Function Tests:  Recent Labs Lab 08/11/13 0414  AST 18  ALT 14  ALKPHOS 88  BILITOT 0.4  PROT 6.7  ALBUMIN 3.3*   No results found for this basename: LIPASE, AMYLASE,  in the last 168 hours No results found for this basename: AMMONIA,  in the last 168 hours CBC:  Recent Labs  Lab 08/10/13 1929 08/11/13 0414 08/14/13 0600  WBC 7.3 7.6 12.3*  NEUTROABS 4.8  --   --   HGB 12.1 12.8 10.6*  HCT 37.0 39.1 32.7*  MCV 94.9 95.1 95.9  PLT 224 225 217   Cardiac Enzymes: No results found for this basename: CKTOTAL, CKMB, CKMBINDEX, TROPONINI,  in the last 168 hours BNP (last 3 results) No results found for this basename: PROBNP,  in the last 8760 hours CBG: No results found for this basename: GLUCAP,  in the last 168 hours  Recent Results (from the past 240 hour(s))  URINE CULTURE      Status: None   Collection Time    08/10/13  9:05 PM      Result Value Range Status   Specimen Description URINE, CATHETERIZED   Final   Special Requests NONE   Final   Culture  Setup Time     Final   Value: 08/11/2013 09:04     Performed at [facility] Count     Final   Value: NO GROWTH     Performed at [facility]   Culture     Final   Value: NO GROWTH     Performed at [facility]   Report Status 08/12/2013 FINAL   Final  SURGICAL PCR SCREEN     Status: None   Collection Time    08/12/13 11:30 PM      Result Value Range Status   MRSA, PCR NEGATIVE  NEGATIVE Final   Staphylococcus aureus NEGATIVE  NEGATIVE Final   Comment:            The Xpert SA Assay (FDA     approved for NASAL specimens     in patients over 64 years of age),     is one component of     a comprehensive surveillance     program.  Test performance has     been validated by [employer] for patients greater     than or equal to 77 year old.     It is not intended     to diagnose infection nor to     guide or monitor treatment.     Studies: No results found.  Scheduled Meds: . amitriptyline  25 mg Oral QHS  . dexamethasone  4 mg Intravenous Q6H  . docusate sodium  100 mg Oral BID  . heparin subcutaneous  5,000 Units Subcutaneous Q8H  . levETIRAcetam  500 mg Oral BID  . levothyroxine  100 mcg Oral QAC breakfast  . losartan  50 mg Oral Daily  . pantoprazole  40 mg Oral Daily  . simvastatin  20 mg Oral q1800  . sodium chloride  3 mL Intravenous Q12H   Continuous Infusions:     Time spent: 25 minutes    Shin Lamour  [facility] Pager [phone]. If 7PM-7AM, please contact night-coverage at [email or web address], password [facility] 08/17/2013, 11:49 AM  LOS: 7 days

## 2013-08-17 NOTE — Progress Notes (Signed)
Physical Therapy Treatment Patient Details Name: [name] MRN: [id number] DOB: Mar 23, 1928 Today's Date: 08/17/2013 Time: 1191-4782 PT Time Calculation (min): 43 min  PT Assessment / Plan / Recommendation  History of Present Illness HPI: [name] is a 79 y.o. female with a history of increasing confusion and weakness over the past few months who was found in the ED to have a large right frontal mass. She has been making errors with her checking account and acting confused as well. She presented today after a home health nurse checked on her and found her to be weak on the left and advised her to come to the ED. Pt s/p tumor resection   PT Comments   Patient agreeable and excited to ambulate. Still having some high risk safety issues and needing A for safety as patient also very impulsive. Patients daughter present and has decided they will not be able to assist with patient at home and they are now planning to look at SNF once medically ready. CSW and Case Manager are aware. Updated plan accordingly. Patient incontinent several times throughout session. Supplied patient with pad and mesh panties  Follow Up Recommendations  SNF     Does the patient have the potential to tolerate intense rehabilitation     Barriers to Discharge        Equipment Recommendations  Rolling walker with 5" wheels    Recommendations for Other Services    Frequency Min 4X/week   Progress towards PT Goals Progress towards PT goals: Progressing toward goals  Plan Discharge plan needs to be updated    Precautions / Restrictions Precautions Precautions: Fall   Pertinent Vitals/Pain no apparent distress     Mobility  Bed Mobility Supine to Sit: 5: Supervision;With rails Transfers Sit to Stand: 4: Min assist;With upper extremity assist;From chair/3-in-1;From bed Stand to Sit: 4: Min assist;With upper extremity assist;To chair/3-in-1;To bed Details for Transfer Assistance: cues for hand  placement. Stood x4 for education on technique  Ambulation/Gait Ambulation/Gait Assistance: 4: Min Environmental consultant (Feet): 150 Feet Assistive device: Rolling walker Ambulation/Gait Assistance Details: Patient with better control and use of RW. Still required Min A for balance at time, especially with turns. Cues to avoid obstacles with use of RW Gait Pattern: Step-through pattern;Decreased stride length    Exercises     PT Diagnosis:    PT Problem List:   PT Treatment Interventions:     PT Goals (current goals can now be found in the care plan section)    Visit Information  Last PT Received On: 08/17/13 Assistance Needed: +1 History of Present Illness: HPI: [name] is a 79 y.o. female with a history of increasing confusion and weakness over the past few months who was found in the ED to have a large right frontal mass. She has been making errors with her checking account and acting confused as well. She presented today after a home health nurse checked on her and found her to be weak on the left and advised her to come to the ED. Pt s/p tumor resection    Subjective Data      Cognition  Cognition Arousal/Alertness: Awake/alert Behavior During Therapy: WFL for tasks assessed/performed Overall Cognitive Status: Impaired/Different from baseline Area of Impairment: Safety/judgement;Awareness;Problem solving Memory: Decreased recall of precautions;Decreased short-term memory Safety/Judgement: Decreased awareness of deficits Problem Solving: Requires verbal cues;Requires tactile cues;Slow processing General Comments: needs multiple cues for safety and sequencing activities    Balance  Dynamic Standing  Balance Dynamic Standing - Level of Assistance: 3: Mod assist Dynamic Standing - Balance Activities: Reaching for objects;Reaching across midline;Forward lean/weight shifting;Lateral lean/weight shifting  End of Session PT - End of Session Equipment Utilized  During Treatment: Gait belt Activity Tolerance: Patient tolerated treatment well Patient left: in chair;with call bell/phone within reach Nurse Communication: Mobility status   GP     Fredrich Birks 08/17/2013, 12:59 PM

## 2013-08-17 NOTE — Consult Note (Signed)
Physical Medicine and Rehabilitation Consult Reason for Consult: Right frontal lobe glioblastoma multi-forme Referring Physician: [facility]   HPI: [name] is a 79 y.o. right-handed female admitted 08/10/2013 with progressive left-sided weakness over the past 4-5 weeks as well as frontal headaches with frequent falls. The home health nurse noted increasing left-sided weakness. CT scan of the head showed a right frontal lobe mass. MRI of the brain showed large ring enhancing right frontal tumor. CT chest abdomen did not demonstrate any evidence of extracranial tumor. MRI of the brain concerning for high-grade glioma. Underwent stereotactic right frontal craniotomy for resection of tumor 08/13/2013 per Dr. Conchita Paris. Maintained on Decadron protocol. Oncology services Dr. Myna Hidalgo with recommendations of radiation therapy and await plan for radiation oncology followup. Placed on subcutaneous heparin for DVT prophylaxis. Physical and occupational therapy evaluations completed patient's mobility continues to wax and wane. M.D. is requested physical medicine rehabilitation consult to consider inpatient rehabilitation services  Review of Systems  Gastrointestinal:       GERD  Genitourinary:       Urinary incontinence  Musculoskeletal: Positive for falls and myalgias.  Neurological: Positive for headaches.  Psychiatric/Behavioral: Positive for depression.  All other systems reviewed and are negative.   Past Medical History  Diagnosis Date  . CVA 1998    "mini" cva, no residual deficts  . ARTHRITIS   . URINARY INCONTINENCE   . DEPRESSION   . DDD (degenerative disc disease)     Cervical spine & knees  . GERD   . DYSLIPIDEMIA   . HYPERTENSION   . HYPOTHYROIDISM   . PEPTIC ULCER DISEASE 11/2009  . CARCINOMA, THYROID GLAND, PAPILLARY 09/2008    s/p total thyroidectomy  . Osteoporosis    Past Surgical History  Procedure Laterality Date  . Cholecystectomy    . Abdominal hysterectomy     . Thyroidectomy  2/30/10    papillary, s/p total  . Breast biopsy    . Back surgery    . Tonsillectomy    . Appendectomy    . Incontinence surgery    . Wrist surgery      LEFT  . Cardiac catheterization  03/30/2005    NORMAL EF. NORMAL CORONARIES  . Cardiovascular stress test  01/27/2003    NO EVIDENCE OF ISCHEMIA. EF 74%  . US echocardiography  03/16/2009    EF 55-60%  . US echocardiography  01/26/2003    EF 60-65%   Family History  Problem Relation Age of Onset  . Arthritis Mother   . Diabetes Mother   . Hyperlipidemia Mother   . Arthritis Father   . Diabetes Father   . Hyperlipidemia Father   . Breast cancer Maternal Grandmother   . Cancer Sister     breast   Social History:  reports that she has never smoked. She does not have any smokeless tobacco history on file. She reports that she does not drink alcohol or use illicit drugs. Allergies:  Allergies  Allergen Reactions  . Iodine     REACTION: Nausea   Medications Prior to Admission  Medication Sig Dispense Refill  . ALPRAZolam (XANAX) 0.25 MG tablet Take 1 tablet (0.25 mg total) by mouth 3 (three) times daily as needed for anxiety.  90 tablet  1  . amitriptyline (ELAVIL) 25 MG tablet Take 1 tablet (25 mg total) by mouth at bedtime.  30 tablet  5  . aspirin 81 MG tablet Take 81 mg by mouth daily.        Marland Kitchen  clopidogrel (PLAVIX) 75 MG tablet Take 1 tablet (75 mg total) by mouth daily with breakfast.  30 tablet  11  . fluocinonide-emollient (LIDEX-E) 0.05 % cream Apply 1 application topically 2 (two) times a week.       . indomethacin (INDOCIN) 25 MG capsule Take 25 mg by mouth daily as needed for moderate pain.      Marland Kitchen levothyroxine (SYNTHROID, LEVOTHROID) 100 MCG tablet Take 1 tablet (100 mcg total) by mouth daily.  30 tablet  11  . losartan (COZAAR) 50 MG tablet Take 1 tablet (50 mg total) by mouth daily.  30 tablet  11  . methocarbamol (ROBAXIN) 500 MG tablet Take 500-1,000 mg by mouth every 8 (eight) hours as needed for  muscle spasms.      . pravastatin (PRAVACHOL) 40 MG tablet Take 40 mg by mouth daily.      . promethazine (PHENERGAN) 25 MG tablet Take 25 mg by mouth every 8 (eight) hours as needed for nausea or vomiting.      . traMADol-acetaminophen (ULTRACET) 37.5-325 MG per tablet Take 1 tablet by mouth 2 (two) times daily as needed for moderate pain.      . pantoprazole (PROTONIX) 40 MG tablet Take 40 mg by mouth 2 (two) times daily.          Home: Home Living Family/patient expects to be discharged to:: Private residence Living Arrangements: Children Available Help at Discharge: Available 24 hours/day Type of Home: House Home Access: Stairs to enter [employer] of Steps: 7 Entrance Stairs-Rails: Right;Left Home Layout: Multi-level;Bed/bath upstairs Home Equipment: Environmental consultant - 4 wheels;Walker - standard;Cane - single point Additional Comments: walk in shower with threshold, door, standard commode  Functional History:   Functional Status:  Mobility: Bed Mobility Bed Mobility: Supine to Sit;Sitting - Scoot to Edge of Bed;Sit to Supine Supine to Sit: 3: Mod assist Sitting - Scoot to Edge of Bed: 3: Mod assist Sit to Supine: 3: Mod assist;HOB flat;With rail Transfers Transfers: Sit to Stand;Stand to Sit Sit to Stand: 4: Min assist;With upper extremity assist;From chair/3-in-1 Stand to Sit: 4: Min assist;With upper extremity assist;To chair/3-in-1 Ambulation/Gait Ambulation/Gait Assistance: 3: Mod assist Ambulation Distance (Feet): 30 Feet (x 2) Assistive device: Rolling walker Ambulation/Gait Assistance Details: max cues and guidance for direction/wayfinding to bathroom and back to chair.  Perseverates using toilet and washing hands needing guidance to follow through to next activity; decreased awarenss left side Gait Pattern: Step-through pattern;Shuffle;Decreased stride length    ADL: ADL Eating/Feeding: NPO Grooming: Wash/dry hands;Minimal assistance Where Assessed -  Grooming: Supported standing Lower Body Bathing: Moderate assistance Where Assessed - Lower Body Bathing: Supported sit to stand Upper Body Dressing: Moderate assistance Where Assessed - Upper Body Dressing: Supported sitting Lower Body Dressing: Moderate assistance Where Assessed - Lower Body Dressing: Supported sit to Pharmacist, hospital: Moderate assistance Toilet Transfer Method: Sit to stand Toilet Transfer Equipment: Regular height toilet;Grab bars Equipment Used: Gait belt;Rolling walker Transfers/Ambulation Related to ADLs: Pt with urgency to all transfers with Lt Le weakness. Pt dragging Lt LE. pt walking with LT LE outside the RW. Pt with left inattention. pt walking into door frame with RW. Pt with Lt UE wrist flexion and rotation on RW with supination.  ADL Comments: pt unaware of left side deficits.Pt with incr risk of falls with urgency to void bladder. Pt with incontinence. Pt reports constant need to void and odor to her urine. Question UTI? Pt needed cues for hand hygiene  Cognition: Cognition Overall Cognitive  Status: Impaired/Different from baseline Orientation Level: Oriented X4 Cognition Arousal/Alertness: Awake/alert Behavior During Therapy: WFL for tasks assessed/performed Overall Cognitive Status: Impaired/Different from baseline Area of Impairment: Safety/judgement;Awareness;Problem solving Memory: Decreased recall of precautions;Decreased short-term memory Safety/Judgement: Decreased awareness of deficits Awareness: Emergent;Anticipatory Problem Solving: Requires verbal cues;Requires tactile cues;Slow processing General Comments: hyperverbal  Blood pressure 129/57, pulse 61, temperature 97.8 F (36.6 C), temperature source Oral, resp. rate 17, height 5\' 4"  (1.626 m), weight 80.1 kg (176 lb 9.4 oz), SpO2 95.00%. Physical Exam  Vitals reviewed. Constitutional: She is oriented to person, place, and time.  Eyes: EOM are normal.  Neck: Normal range of motion.  Neck supple. No thyromegaly present.  Cardiovascular: Normal rate and regular rhythm.   Respiratory: Effort normal and breath sounds normal. No respiratory distress.  GI: Soft. Bowel sounds are normal. She exhibits no distension.  Neurological: She is alert and oriented to person, place, and time.  Mood is flat but appropriate. She does follow simple commands. She has a fair awareness of her deficits and hospital course. Anxious, distractible. Impulsive. Moves all 4's fairly equally. No gross sensory deficits  Skin:  Craniotomy site clean and dry  Psychiatric:  anxious    No results found for this or any previous visit (from the past 24 hour(s)). No results found.  Assessment/Plan: Diagnosis: right glioma, resection 1. Does the need for close, 24 hr/day medical supervision in concert with the patient's rehab needs make it unreasonable for this patient to be served in a less intensive setting? No 2. Co-Morbidities requiring supervision/potential complications: htn, hypothyroid 3. Due to bladder management, bowel management, safety, skin/wound care, disease management, medication administration, pain management and patient education, does the patient require 24 hr/day rehab nursing? No 4. Does the patient require coordinated care of a physician, rehab nurse, PT (1-2 hrs/day, 5 days/week), OT (1-2 hrs/day, 5 days/week) and SLP (1-2 hrs/day, 5 days/week) to address physical and functional deficits in the context of the above medical diagnosis(es)? No Addressing deficits in the following areas: balance, endurance, locomotion, strength, transferring, bowel/bladder control, bathing, dressing, feeding, grooming, toileting, cognition and psychosocial support 5. Can the patient actively participate in an intensive therapy program of at least 3 hrs of therapy per day at least 5 days per week? Yes 6. The potential for patient to make measurable gains while on inpatient rehab is good and  fair 7. Anticipated functional outcomes upon discharge from inpatient rehab are supervision with PT, supervision to min assist with OT, supervision to min assist with SLP. 8. Estimated rehab length of stay to reach the above functional goals is: n/a 9. Does the patient have adequate social supports to accommodate these discharge functional goals? No 10. Anticipated D/C setting: Home 11. Anticipated post D/C treatments: HH therapy 12. Overall Rehab/Functional Prognosis: good and fair  RECOMMENDATIONS: This patient's condition is appropriate for continued rehabilitative care in the following setting: SNF Patient has agreed to participate in recommended program. Yes Note that insurance prior authorization may be required for reimbursement for recommended care.  Comment: spoke with daughter. She has no intentions of bringing her home. Can't justify an inpatient rehab admission otherwise.    Ranelle Oyster, MD, [place] [facility]     08/17/2013

## 2013-08-17 NOTE — Progress Notes (Signed)
Occupational Therapy Treatment Patient Details Name: BRANDACE CARGLE MRN: [id number] DOB: Feb 16, 1928 Today's Date: 08/17/2013 Time: 4098-1191 OT Time Calculation (min): 16 min  OT Assessment / Plan / Recommendation  History of present illness HPI: LINSI HUMANN is a 79 y.o. female with a history of increasing confusion and weakness over the past few months who was found in the ED to have a large right frontal mass. She has been making errors with her checking account and acting confused as well. She presented today after a home health nurse checked on her and found her to be weak on the left and advised her to come to the ED. Pt s/p tumor resection   OT comments  Pt progressing towards goals. Recommending SNF for additional rehab prior to d/c home.   Follow Up Recommendations  SNF;Supervision/Assistance - 24 hour    Barriers to Discharge       Equipment Recommendations  3 in 1 bedside comode    Recommendations for Other Services    Frequency Min 2X/week   Progress towards OT Goals Progress towards OT goals: Progressing toward goals  Plan Discharge plan remains appropriate    Precautions / Restrictions Precautions Precautions: Fall Restrictions Weight Bearing Restrictions: No   Pertinent Vitals/Pain Pain in back. Repositioned.     ADL  Grooming: Wash/dry hands;Teeth care;Minimal assistance Where Assessed - Grooming: Supported standing Lower Body Bathing: Min guard Where Assessed - Lower Body Bathing: Supported standing Toilet Transfer: Min Sports administrator Method: Sit to Barista: Raised toilet seat with arms (or 3-in-1 over toilet) Toileting - Clothing Manipulation and Hygiene: Minimal assistance Where Assessed - Glass blower/designer Manipulation and Hygiene: Standing;Sit on 3-in-1 or toilet Equipment Used: Gait belt;Rolling walker Transfers/Ambulation Related to ADLs: Min guard/Min A for ambulation and  transfers. ADL Comments: Pt incontinent of bladder once standing. Min A for balance at sink and to open mouthwash container. Difficulty when given two commands (decreased memory).  Cues to get closer to the sink. Pt stood at sink and washed off legs and peri area.    OT Diagnosis:    OT Problem List:   OT Treatment Interventions:     OT Goals(current goals can now be found in the care plan section) Acute Rehab OT Goals Patient Stated Goal: not stated OT Goal Formulation: With patient Time For Goal Achievement: 08/29/13 Potential to Achieve Goals: Good ADL Goals Pt Will Perform Grooming: with min guard assist;standing Pt Will Transfer to Toilet: with min guard assist;bedside commode Pt Will Perform Toileting - Clothing Manipulation and hygiene: with min guard assist;sit to/from stand Additional ADL Goal #1: Pt will complete 2 step command without visual cues or repetition of instructions min guard (A)  Visit Information  Last OT Received On: 08/17/13 Assistance Needed: +1 History of Present Illness: HPI: NOVALI VOLLMAN is a 79 y.o. female with a history of increasing confusion and weakness over the past few months who was found in the ED to have a large right frontal mass. She has been making errors with her checking account and acting confused as well. She presented today after a home health nurse checked on her and found her to be weak on the left and advised her to come to the ED. Pt s/p tumor resection    Subjective Data      Prior Functioning       Cognition  Cognition Arousal/Alertness: Awake/alert Behavior During Therapy: WFL for tasks assessed/performed Overall Cognitive Status: Impaired/Different from baseline Area  of Impairment: Problem solving;Memory;Following commands Memory: Decreased short-term memory Following Commands: Follows multi-step commands inconsistently Safety/Judgement: Decreased awareness of safety Problem Solving: Slow processing;Requires verbal  cues;Requires tactile cues    Mobility  Bed Mobility Bed Mobility: Sit to Supine Sit to Supine: 4: Min guard Details for Bed Mobility Assistance: Cues for technique. Transfers Transfers: Stand to Sit;Sit to Stand Sit to Stand: 4: Min guard;With upper extremity assist;From chair/3-in-1 Stand to Sit: 4: Min guard;4: Min assist;To chair/3-in-1;To bed Details for Transfer Assistance: Cues for hand placement.    Exercises      Balance     End of Session OT - End of Session Equipment Utilized During Treatment: Gait belt;Rolling walker Activity Tolerance: Patient tolerated treatment well Patient left: in bed;with call bell/phone within reach;with bed alarm set  GO     Earlie Raveling OTR/L [phone] 08/17/2013, 5:45 PM

## 2013-08-17 NOTE — Progress Notes (Signed)
Radiation Oncology         [phone] ________________________________  Initial inpatient Consultation  Name: SHAQUANDRA GALANO MRN: [id number]  Date: 08/17/2013  DOB: July 15, 1928  WU:[id number] Felicity Coyer, MD  Josph Macho, MD , Lisbeth Renshaw, MD  REFERRING PHYSICIAN: Josph Macho, MD  DIAGNOSIS: Glioblastoma multiforme of the right frontal brain  HISTORY OF PRESENT ILLNESS::Laurajean H Market is a 79 y.o. female who is seen out courtesy of Dr. Arlan Organ for an opinion concerning radiation therapy as part of management of patient's recently diagnosed GBM presenting in the right frontal brain. The patient presented with a recent history of left-sided weakness and difficulty walking. Prior to her presentation to the emergency room she also was having frequent falls.  A CT scan was performed which revealed a mass within the right frontal lobe area with extensive surrounding vasogenic edema. There was a midline shift to the left. An MRI was performed which confirmed a heterogeneous right frontal lobe tumor measuring 3.9 x 4.5 x 4.1 cm.  CT scan of the chest abdomen and pelvis did not show any evidence of a potential primary site with subsequent brain metastasis. Patient was felt to have a primary brain tumor presenting in the right frontal lobe. She was seen by neurosurgery and proceeded to undergo gross total resection on 08/13/2013. She was found to have a glioblastoma, WHO grade IV tumor.  Patient has done well in the postoperative period with improvement in her left-sided weakness. She was able to ambulate with the assistance of physical therapy today. A postoperative MRI was performed which showed  enhancement along the posterior lateral margin likely be postoperative in nature without any definite residual tumor identified. Radiation therapy has been consulted for consideration for postoperative treatments.  PREVIOUS RADIATION THERAPY: No external beam radiation treatments,  possibly radioactive iodine treatment  PAST MEDICAL HISTORY:  has a past medical history of CVA (1998); ARTHRITIS; URINARY INCONTINENCE; DEPRESSION; DDD (degenerative disc disease); GERD; DYSLIPIDEMIA; HYPERTENSION; HYPOTHYROIDISM; PEPTIC ULCER DISEASE (11/2009); CARCINOMA, THYROID GLAND, PAPILLARY (09/2008); and Osteoporosis.    PAST SURGICAL HISTORY: Past Surgical History  Procedure Laterality Date  . Cholecystectomy    . Abdominal hysterectomy    . Thyroidectomy  2/30/10    papillary, s/p total  . Breast biopsy    . Back surgery    . Tonsillectomy    . Appendectomy    . Incontinence surgery    . Wrist surgery      LEFT  . Cardiac catheterization  03/30/2005    NORMAL EF. NORMAL CORONARIES  . Cardiovascular stress test  01/27/2003    NO EVIDENCE OF ISCHEMIA. EF 74%  . US echocardiography  03/16/2009    EF 55-60%  . US echocardiography  01/26/2003    EF 60-65%    FAMILY HISTORY: family history includes Arthritis in her father and mother; Breast cancer in her maternal grandmother; Cancer in her sister; Diabetes in her father and mother; Hyperlipidemia in her father and mother.  SOCIAL HISTORY:  reports that she has never smoked. She does not have any smokeless tobacco history on file. She reports that she does not drink alcohol or use illicit drugs. she lives with her youngest daughter in the [place] area  ALLERGIES: Iodine  MEDICATIONS:  No current facility-administered medications for this encounter.   No current outpatient prescriptions on file.   Facility-Administered Medications Ordered in Other Encounters  Medication Dose Route Frequency Provider Last Rate Last Dose  . acetaminophen (TYLENOL) tablet 650 mg  650 mg Oral Q6H PRN Toy Baker, MD   650 mg at 08/16/13 1659   Or  . acetaminophen (TYLENOL) suppository 650 mg  650 mg Rectal Q6H PRN Toy Baker, MD   650 mg at 08/11/13 0119  . ALPRAZolam Duanne Moron) tablet 0.25 mg  0.25 mg Oral TID PRN Toy Baker,  MD   0.25 mg at 08/15/13 2140  . amitriptyline (ELAVIL) tablet 25 mg  25 mg Oral QHS Toy Baker, MD   25 mg at 08/16/13 2230  . dexamethasone (DECADRON) injection 4 mg  4 mg Intravenous Q6H Toy Baker, MD   4 mg at 08/17/13 1300  . docusate sodium (COLACE) capsule 100 mg  100 mg Oral BID Toy Baker, MD   100 mg at 08/17/13 1042  . heparin injection 5,000 Units  5,000 Units Subcutaneous Q8H Consuella Lose, MD   5,000 Units at 08/17/13 1500  . hydrALAZINE (APRESOLINE) tablet 10 mg  10 mg Oral Q6H PRN Nishant Dhungel, MD      . HYDROcodone-acetaminophen (NORCO/VICODIN) 5-325 MG per tablet 1-2 tablet  1-2 tablet Oral Q4H PRN Toy Baker, MD   2 tablet at 08/16/13 1950  . levETIRAcetam (KEPPRA) tablet 500 mg  500 mg Oral BID Consuella Lose, MD   500 mg at 08/17/13 1041  . levothyroxine (SYNTHROID, LEVOTHROID) tablet 100 mcg  100 mcg Oral QAC breakfast Toy Baker, MD   100 mcg at 08/17/13 1041  . losartan (COZAAR) tablet 50 mg  50 mg Oral Daily Geradine Girt, DO   50 mg at 08/17/13 1041  . methocarbamol (ROBAXIN) tablet 500-1,000 mg  500-1,000 mg Oral Q8H PRN Toy Baker, MD      . ondansetron (ZOFRAN) tablet 4 mg  4 mg Oral Q4H PRN Consuella Lose, MD       Or  . ondansetron (ZOFRAN) injection 4 mg  4 mg Intravenous Q4H PRN Consuella Lose, MD      . ondansetron (ZOFRAN) tablet 4 mg  4 mg Oral Q6H PRN Toy Baker, MD      . pantoprazole (PROTONIX) EC tablet 40 mg  40 mg Oral Daily Consuella Lose, MD   40 mg at 08/17/13 1042  . promethazine (PHENERGAN) tablet 12.5-25 mg  12.5-25 mg Oral Q4H PRN Consuella Lose, MD      . simvastatin (ZOCOR) tablet 20 mg  20 mg Oral q1800 Toy Baker, MD   20 mg at 08/16/13 1659  . sodium chloride 0.9 % injection 3 mL  3 mL Intravenous Q12H Toy Baker, MD   3 mL at 08/17/13 1525  . traMADol-acetaminophen (ULTRACET) 37.5-325 MG per tablet 2 tablet  2 tablet Oral Q6H PRN Louellen Molder, MD    2 tablet at 08/15/13 1813    REVIEW OF SYSTEMS:  A 15 point review of systems is documented in the electronic medical record. This was obtained by the nursing staff. However, I reviewed this with the patient to discuss relevant findings and make appropriate changes.  She presented with left-sided weakness and difficulty with ambulation as above. Patient admitted to dragging her left foot prior to surgery. She denies any problems with headache this time but a tight sensation along her right frontal scalp.   PHYSICAL EXAM: Pulse is 74 respirations 18 temperature 97.8 oxygen saturation 95% on room air. Blood pressure is 152/61 This is a very pleasant alert and cooperative 79 year old female in no acute distress. She responds promptly to most questions. She has some mild confusion. She is accompanied by a good  friend on evaluation this afternoon. The patient has a dressing in place along the right frontal scalp area.  On neurological examination she appears regained strength along the right side with plantar flexion and dorsiflexion equal bilaterally. She is able to raise both legs off the hospital bed without difficulty. Grip strength is 5/5,  proximal upper motor strength is 5 out of 5.    ECOG = 2  2 - Symptomatic, <50% in bed during the day (Ambulatory and capable of all self care but unable to carry out any work activities. Up and about more than 50% of waking hours)   LABORATORY DATA:  Lab Results  Component Value Date   WBC 12.3* 08/14/2013   HGB 10.6* 08/14/2013   HCT 32.7* 08/14/2013   MCV 95.9 08/14/2013   PLT 217 08/14/2013   Lab Results  Component Value Date   NA 133* 08/17/2013   K 4.1 08/17/2013   CL 101 08/17/2013   CO2 23 08/17/2013   Lab Results  Component Value Date   ALT 14 08/11/2013   AST 18 08/11/2013   ALKPHOS 88 08/11/2013   BILITOT 0.4 08/11/2013     RADIOGRAPHY: Ct Abdomen Pelvis Wo Contrast  08/11/2013   CLINICAL DATA:  Recently diagnosed brain  neoplasm this study is performed for staging evaluation  EXAM: CT CHEST WITHOUT CONTRAST; CT ABDOMEN AND PELVIS WITHOUT CONTRAST  TECHNIQUE: Multidetector CT imaging of the chest was performed following the standard protocol without IV contrast.; Multidetector CT imaging of the abdomen and pelvis was performed following the standard protocol without intravenous contrast.  COMPARISON:  None.  FINDINGS: Noncontrast evaluation thoracic inlet is unremarkable.  There is no evidence of mediastinal nor hilar adenopathy nor masses. Trace bilateral pleural effusions are appreciated. A very small hiatal hernia is identified. There is mild-to-moderate multichamber cardiac enlargement. Coronary artery atherosclerotic calcifications are identified as well as atherosclerotic calcifications within the aorta. There is no evidence of a thoracic aortic aneurysm.  There is small bibasilar pleural effusions are identified as well as minimal atelectasis versus trace infiltrates. There is no evidence of a pulmonary masses or nodules. Small calcified pleural plaques are identified posteriorly within the lung apices. There is no evidence of pulmonary nodules, masses, infiltrates. The central airways are patent.  Noncontrast evaluation of the liver demonstrates very small punctate calcified densities within the dome the right lobe of the liver. The liver parenchyma is otherwise unremarkable. Patient status post cholecystectomy. The spleen, adrenals, pancreas, kidneys are unremarkable.  There is no evidence of bowel obstruction. There no secondary signs reflecting enteritis, colitis, diverticulitis, normal appendicitis.  There is no evidence of abdominal or pelvic adenopathy, masses, nor free fluid. The urinary bladder is distended and appears to be filled with urine.  The osseous structures demonstrate multilevel multifactorial degenerative changes throughout the thoracolumbar spine without evidence of aggressive appearing lytic or  sclerotic lesions.  There is no evidence of an abdominal aortic aneurysm. Atherosclerotic calcifications are identified within the abdominal aorta. The  IMPRESSION: 1. Trace bilateral pleural effusions with findings likely representing mild areas of atelectasis within the lung bases. Minimal infiltrates and clinically appropriate cannot be excluded. Otherwise no evidence of pulmonary nodules nor masses. 2. There is no evidence of masses nor nodules within the chest abdomen or pelvis. There is no evidence of pathologic size adenopathy. 3. Moderate amount of fecal retention without evidence of obstructive or inflammatory abnormalities. 4. Small calcified pleural plaques within the lung apices findings may represent sequela prior asbestos exposure.  5. Urinary bladder is dilated consistent with the patient's history of urinary incontinence.   Electronically Signed   By: Margaree Mackintosh M.D.   On: 08/11/2013 13:49   Ct Head Wo Contrast  08/13/2013   CLINICAL DATA:  Brain tumor.  Abnormal MRI scan.  Surgical planning.  EXAM: CT HEAD WITHOUT CONTRAST  TECHNIQUE: Contiguous axial images were obtained from the base of the skull through the vertex without intravenous contrast.  COMPARISON:  MRI brain 08/11/2013  FINDINGS: The anterior right frontal lobe mass lesion measures 3.8 x 4.0 cm without IV contrast. Extensive surrounding vasogenic edema is again noted, extending into the external capsule. There are no significant calcifications associated with the lesion. Midline shift is again noted, measuring up to 9 mm. No significant extra-axial fluid collections are present. There is asymmetric effacement of the sulci on the right.  The paranasal sinuses and mastoid air cells are clear. The osseous skull is intact.  IMPRESSION: 1. Right frontal lobe tumor localized for surgical planning purposes. As previously stated, this most likely represents a high-grade primary dizziness neoplasm such as a the GBM. 2. Diffuse vasogenic  edema as described. 3. Persistent midline shift of 9 mm.   Electronically Signed   By: Lawrence Santiago M.D.   On: 08/13/2013 12:26   Ct Head Wo Contrast  08/10/2013   CLINICAL DATA:  Dizziness and headache  EXAM: CT HEAD WITHOUT CONTRAST  TECHNIQUE: Contiguous axial images were obtained from the base of the skull through the vertex without intravenous contrast. Study was performed within 24 hr of patient's arrival at the emergency department.  COMPARISON:  June 30, 2008  FINDINGS: There is widespread vasogenic edema throughout the right frontal lobe extending into the anterior right parietal lobe. Within the right frontal lobe, there is a masslike area measuring 4.0 by 3.3 cm. There is marked effacement of the lateral ventricles with midline shift toward the left by approximately 16 mm.  There is no hemorrhage. There is no subdural or epidural fluid. Bony calvarium appears intact. The mastoid air cells are clear.  IMPRESSION: Findings felt to represent mass in the right frontal lobe with extensive surrounding vasogenic edema which effaces the lateral ventricles and causes midline shift toward the left. Neoplasm is the most likely etiology for this finding, although abscess is a differential consideration on this study. Given these findings, contrast enhanced CT or pre and post-contrast MR to further assess would be warranted.  Critical Value/emergent results were called by telephone at the time of interpretation on 08/10/2013 at 8:22 PM to Dr. Tanna Furry , who verbally acknowledged these results.   Electronically Signed   By: Lowella Grip M.D.   On: 08/10/2013 20:23   Ct Chest Wo Contrast  08/11/2013   CLINICAL DATA:  Recently diagnosed brain neoplasm this study is performed for staging evaluation  EXAM: CT CHEST WITHOUT CONTRAST; CT ABDOMEN AND PELVIS WITHOUT CONTRAST  TECHNIQUE: Multidetector CT imaging of the chest was performed following the standard protocol without IV contrast.; Multidetector CT  imaging of the abdomen and pelvis was performed following the standard protocol without intravenous contrast.  COMPARISON:  None.  FINDINGS: Noncontrast evaluation thoracic inlet is unremarkable.  There is no evidence of mediastinal nor hilar adenopathy nor masses. Trace bilateral pleural effusions are appreciated. A very small hiatal hernia is identified. There is mild-to-moderate multichamber cardiac enlargement. Coronary artery atherosclerotic calcifications are identified as well as atherosclerotic calcifications within the aorta. There is no evidence of a thoracic aortic aneurysm.  There is small bibasilar pleural effusions are identified as well as minimal atelectasis versus trace infiltrates. There is no evidence of a pulmonary masses or nodules. Small calcified pleural plaques are identified posteriorly within the lung apices. There is no evidence of pulmonary nodules, masses, infiltrates. The central airways are patent.  Noncontrast evaluation of the liver demonstrates very small punctate calcified densities within the dome the right lobe of the liver. The liver parenchyma is otherwise unremarkable. Patient status post cholecystectomy. The spleen, adrenals, pancreas, kidneys are unremarkable.  There is no evidence of bowel obstruction. There no secondary signs reflecting enteritis, colitis, diverticulitis, normal appendicitis.  There is no evidence of abdominal or pelvic adenopathy, masses, nor free fluid. The urinary bladder is distended and appears to be filled with urine.  The osseous structures demonstrate multilevel multifactorial degenerative changes throughout the thoracolumbar spine without evidence of aggressive appearing lytic or sclerotic lesions.  There is no evidence of an abdominal aortic aneurysm. Atherosclerotic calcifications are identified within the abdominal aorta. The  IMPRESSION: 1. Trace bilateral pleural effusions with findings likely representing mild areas of atelectasis within the  lung bases. Minimal infiltrates and clinically appropriate cannot be excluded. Otherwise no evidence of pulmonary nodules nor masses. 2. There is no evidence of masses nor nodules within the chest abdomen or pelvis. There is no evidence of pathologic size adenopathy. 3. Moderate amount of fecal retention without evidence of obstructive or inflammatory abnormalities. 4. Small calcified pleural plaques within the lung apices findings may represent sequela prior asbestos exposure. 5. Urinary bladder is dilated consistent with the patient's history of urinary incontinence.   Electronically Signed   By: Margaree Mackintosh M.D.   On: 08/11/2013 13:49   Mr Brain Wo Contrast  08/10/2013   CLINICAL DATA:  Abnormal CT, mass.  EXAM: MRI HEAD WITHOUT CONTRAST  TECHNIQUE: Multiplanar, multiecho pulse sequences of the brain and surrounding structures were obtained without intravenous contrast. Please note, due to a low GFR of 39, no contrast was administered.  COMPARISON:  CT of the head August 10, 2012 at 2012 hr. MRI of the brain May 18, 2005  FINDINGS: Heterogeneous right frontal lobe 3.9 x 4.5 x 4.1 cm intraparenchymal mass. Associated reduced diffusion, with the lower signal superior component, the T2 bright more cystic component inferiorly shows less reduced diffusion, there is corresponding low ADC values. Surrounding T2 bright vasogenic edema, with local mass effect, effacing the sulci, with 13 mm of right to left subfalcine herniation. No intrinsic T1 shortening. Due to mass effect, the anterior cerebral arteries are displaced to the left. Minimal susceptibility artifact along the inferior aspect of the mass.  No hydrocephalus or ventricular entrapment. A few subcentimeter scattered supratentorial white matter T2 hyperintensities exclusive of the right frontal lobe process suggest sequela of chronic small vessel ischemic disease. Remote tiny right cerebellar lacunar infarct. No abnormal extra-axial fluid  collections.  Status post bilateral ocular lens implants. Visualized paranasal sinuses and mastoid air cells appear well-aerated. No suspicious calvarial bone marrow signal. No abnormal sellar expansion. Craniocervical junction maintained.  IMPRESSION: 3.9 x 4.5 x 4.1 cm right frontal lobe intraparenchymal heterogeneous mass (the cystic component may reflect degeneration) with imaging characteristics highly concerning for hypercellular tumor such as glioblastoma multiforme, this less likely reflects abscess, or metastasis. Extensive surrounding FLAIR abnormality may reflect edema and, possibly infiltrating tumor. 1.3 cm of right to left subfalcine herniation, displacing the anterior cerebral arteries to the left, without ACA territory infarct.  Critical Value/emergent results were called by telephone  at the time of interpretation on 08/10/2013 at 11:06 PM to Dr. Rolland Porter , who verbally acknowledged these results.   Electronically Signed   By: Awilda Metro   On: 08/10/2013 23:07   Mr Brain W Wo Contrast  08/15/2013   CLINICAL DATA:  Followup stereotactic right frontal craniotomy for resection of tumor  EXAM: MRI HEAD WITHOUT AND WITH CONTRAST  TECHNIQUE: Multiplanar, multiecho pulse sequences of the brain and surrounding structures were obtained without and with intravenous contrast.  CONTRAST:  15mL MULTIHANCE GADOBENATE DIMEGLUMINE 529 MG/ML IV SOLN  COMPARISON:  Prior MRI from 08/11/2013 and CT from 08/13/2013  FINDINGS: Postoperative changes from interval right frontal craniotomy are seen. Skin staples overlie the right frontal scalp. There is swelling and edema within the scalp soft tissues overlying the craniotomy site. T2 hyperintense signal intensity with scattered blood products are seen within the surgical resection bed within the right frontal lobe. A 1.6 x 1.3 cm T1 hyperintense, T2 hypointense nonenhancing ovoid region seen at the lateral margin the resection cavity likely represents a small  hematoma. Peripheral hypo intense signal intensity seen about this area on the gradient echo sequence. Hypointense signal intensity seen layering along the primary resection cavity on gradient echo sequence is also compatible with blood products. There is increased prominence of the extra axial space overlying the right frontal lobe as compared to prior exam, likely related to tumor debulking/resection. No extra-axial fluid collection/hematoma identified.  There is mild peripheral post-contrast enhancement about the posterior and lateral margin of the resection cavity, most evident on series 11, image 45. This may be related to postoperative changes. No definite residual tumor is identified. Enhancement of the overlying dura is also present, likely reactive. Associated vasogenic edema within the right frontal lobe persists but is overall slightly improved relative to the prior exam. Edema is again seen crossing the corpus callosum. Secondary right-to-left midline shift is improved now measuring approximately 1.2 cm, previously 1.8 cm. The basilar cisterns remain patent. No hydrocephalus.  A 7 mm focus of restricted diffusion is seen at the posterior margin of the resection cavity in the right frontal lobe (series 5, image 24), consistent with infarct. In addition, serpiginous restricted diffusion is seen along the posterolateral margin of the resection cavity (series 5, image 25). No other diffusion abnormality identified. Normal flow voids are seen within the intracranial vasculature.  No new mass or other acute abnormality identified within the brain.  Paranasal sinuses and mastoid air cells remain clear.  IMPRESSION: 1. Postoperative changes from interval right frontal craniotomy with resection of right frontal lobe mass without complication. 2. Mild peripheral rim enhancement about the resection cavity, most prevalent along its posteroateral margin, which may be postoperative in nature. No definite residual  tumor identified. This study will serve as a baseline for future examinations. 3. Persistent but mildly improved vasogenic edema throughout the right frontal lobe with slightly improved right-to-left midline shift now measuring 1.2 cm, previously 1.8 cm. No hydrocephalus. 4. Restricted diffusion along the posterolateral margin of the resection cavity within the right frontal lobe, consistent with small infarcts.   Electronically Signed   By: Rise Mu M.D.   On: 08/15/2013 04:56   Mr Laqueta Jean ZO Contrast  08/11/2013   CLINICAL DATA:  Evaluate frontal tumor.  EXAM: MRI HEAD WITHOUT AND WITH CONTRAST  TECHNIQUE: Multiplanar, multiecho pulse sequences of the brain and surrounding structures were obtained without and with intravenous contrast.  CONTRAST:  8mL MULTIHANCE GADOBENATE DIMEGLUMINE 529 MG/ML IV  SOLN. Half dose contrast was utilized secondary to patient's recent low GFR.  COMPARISON:  08/10/2013 MR and CTA.  FINDINGS: 5.1 x 4.6 x 4.5 cm anterior medial right frontal lobe intra-axial appearing partially necrotic mass with peripheral enhancement with marked surrounding vasogenic edema. This causing significant compression of the lateral ventricle inferiorly and to left with 1.1 cm midline shift to left. Findings are suspicious for primary tumor such as glioblastoma. Despite the restricted motion, abscess is felt to be a much less likely consideration. Solitary metastatic lesion felt unlikely although not excluded.  The edema extends into the corpus callosum to left of midline. This surrounding vasogenic edema may contain tumor cells without surrounding enhancement.  Marked compression of the anterior cerebral arteries to left.  Mild small vessel disease type changes.  A tiny amount of hemorrhage within the right frontal mass cannot be excluded. No other areas of intracranial hemorrhage noted.  Small left vertebral artery. Major intracranial vascular structures are patent.  Degenerative changes C1-2  articulation or transverse ligament hypertrophy.  IMPRESSION: IMPRESSION  5.1 x 4.6 x 4.5 cm anterior medial right frontal lobe intra-axial appearing partially necrotic mass with peripheral enhancement with marked surrounding vasogenic edema. This causing significant compression of the lateral ventricle inferiorly and to left with 1.1 cm midline shift to left. Findings are suspicious for primary tumor such as glioblastoma. Despite the restricted motion, abscess is felt to be a much less likely consideration. Solitary metastatic lesion felt unlikely although not excluded.  The edema extends into the corpus callosum to left of midline. This surrounding vasogenic edema may contain tumor cells without surrounding enhancement.  Marked compression of the anterior cerebral arteries to left.  Despite the broad base along the periphery of the brain, this mass appears intra-axial (as best appreciated on high-resolution slightly motion degraded coronal T2 images).   Electronically Signed   By: Bridgett Larsson M.D.   On: 08/11/2013 21:42      IMPRESSION: Glioblastoma multiforme  of the right frontal brain.  She appears to have had a gross total resection and is improved neurologically since her surgery.  The patient is at high risk for recurrence given her diagnosis and I would recommend postoperative radiation treatments directed at the right frontal brain. In light of the patient's advanced age I may consider a more condensed course of radiation therapy possibly 3-4 weeks versus 6 weeks.    PLAN: The patient will be scheduled for outpatient visit in 7-10 days and will tentatively start postoperative treatments approximately 3 weeks out from surgery.  I spent 40 minutes minutes face to face with the patient and more than 50% of that time was spent in counseling and/or coordination of care.   ------------------------------------------------  -----------------------------------  Billie Lade, PhD, MD

## 2013-08-17 NOTE — Care Management Note (Unsigned)
    Page 1 of 1   08/17/2013     12:07:52 PM   CARE MANAGEMENT NOTE 08/17/2013  Patient:  [name], [name]   Account Number:  [id number]  Date Initiated:  08/11/2013  Documentation initiated by:  Jiles Crocker  Subjective/Objective Assessment:   ADMITTED WITH RT FRONTAL MASS     Action/Plan:   CM FOLLOWING FOR DCP   Anticipated DC Date:  08/17/2013   Anticipated DC Plan:  HOME W HOME HEALTH SERVICES      DC Planning Services  CM consult      Choice offered to / List presented to:             Status of service:  In process, will continue to follow Medicare Important Message given?  NA - LOS <3 / Initial given by admissions (If response is "NO", the following Medicare IM given date fields will be blank) Date Medicare IM given:   Date Additional Medicare IM given:    Discharge Disposition:    Per UR Regulation:  Reviewed for med. necessity/level of care/duration of stay  If discussed at Long Length of Stay Meetings, dates discussed:    Comments:  08/17/13 1130 Elmer Bales RN, MSN, CM- CM recieved a message stating that patient is not a CIR candidate and SNF is recommended at discharge.  CM met with patient's daughter [name] (patient is confused). Per daughter, SNF is preferred over home health as patient is confused and weak. Additionally, patient's daughter expressed concerns about safety in the home environment.  These concerns were shared with CSW, who will be meeting with patient and daughter regarding SNF placement.  Text message sent to Dr Gonzella Lex to update on discharge planning.  12/16/2014Abelino Derrick RN,BSN,MHA [phone]

## 2013-08-17 NOTE — Progress Notes (Signed)
OT NOTE  Ot spoke with daughter present in the room this AM. Daughter requesting SNF placement and inability to manage patients care at home. Question the need for a palliative consult. Ot to continue to follow acutely.   SNF RECOMMENDATION- SW Consult required   Mateo Flow   OTR/L Pager: [phone] Office: [phone] .

## 2013-08-17 NOTE — Progress Notes (Signed)
Rehab admissions - Evaluated for possible admission.  Please see rehab consult done today by Dr. Riley Kill recommending SNF.  Agree with need for SNF.  Call me for questions.  #[phone]

## 2013-08-18 MED ORDER — ENSURE COMPLETE PO LIQD: 237.0000 mL | Freq: Every day | ORAL | Status: DC | PRN

## 2013-08-18 NOTE — Progress Notes (Signed)
Physical Therapy Treatment Patient Details Name: MONA AYARS MRN: [id number] DOB: Nov 29, 1927 Today's Date: 08/18/2013 Time: 1000-1030 PT Time Calculation (min): 30 min  PT Assessment / Plan / Recommendation  History of Present Illness HPI: MAZIKEEN HEHN is a 79 y.o. female with a history of increasing confusion and weakness over the past few months who was found in the ED to have a large right frontal mass. She has been making errors with her checking account and acting confused as well. She presented today after a home health nurse checked on her and found her to be weak on the left and advised her to come to the ED. Pt s/p tumor resection   PT Comments   Patient agreeable to ambulate and get OOB with some encouragement. Patient in bed attempting to eat and drink however, patient with spilt items on her needing assistance to organize tray and clean up some prior to ambulation. Patient also attempting to enter multiple rooms while ambulating, patient educated that she could walk into room and she stated that "I just wanted to say [facility] Christmas". Patient attempted x3 to walk into patient rooms, Cues required for redirection. Patients family awaiting SNF placement for patient due to decreased safety and lack of assistance needed at home.   Follow Up Recommendations  SNF     Does the patient have the potential to tolerate intense rehabilitation     Barriers to Discharge        Equipment Recommendations  Rolling walker with 5" wheels    Recommendations for Other Services    Frequency Min 4X/week   Progress towards PT Goals Progress towards PT goals: Progressing toward goals  Plan Discharge plan needs to be updated    Precautions / Restrictions Precautions Precautions: Fall   Pertinent Vitals/Pain Patient with no complaints of pain    Mobility  Bed Mobility Supine to Sit: 5: Supervision;With rails Transfers Sit to Stand: 4: Min guard;With upper extremity assist;From  bed Stand to Sit: 4: Min guard;To chair/3-in-1;With upper extremity assist Details for Transfer Assistance: Cues for hand placement. Ambulation/Gait Ambulation/Gait Assistance: 4: Min guard Ambulation Distance (Feet): 160 Feet Assistive device: Rolling walker Ambulation/Gait Assistance Details: Better control of RW this session. Cues for pathfinding back to her room. No LOB.  Gait Pattern: Step-through pattern;Decreased stride length    Exercises     PT Diagnosis:    PT Problem List:   PT Treatment Interventions:     PT Goals (current goals can now be found in the care plan section)    Visit Information  Last PT Received On: 08/18/13 Assistance Needed: +1 History of Present Illness: HPI: NEVAYAH FAUST is a 79 y.o. female with a history of increasing confusion and weakness over the past few months who was found in the ED to have a large right frontal mass. She has been making errors with her checking account and acting confused as well. She presented today after a home health nurse checked on her and found her to be weak on the left and advised her to come to the ED. Pt s/p tumor resection    Subjective Data      Cognition  Cognition Arousal/Alertness: Awake/alert Behavior During Therapy: WFL for tasks assessed/performed Overall Cognitive Status: Impaired/Different from baseline Area of Impairment: Problem solving;Memory;Following commands Memory: Decreased short-term memory Following Commands: Follows multi-step commands inconsistently Safety/Judgement: Decreased awareness of safety    Balance     End of Session PT - End of Session Equipment  Utilized During Treatment: Gait belt Activity Tolerance: Patient tolerated treatment well Patient left: in chair;with call bell/phone within reach Nurse Communication: Mobility status   GP     Fredrich Birks 08/18/2013, 11:40 AM 08/18/2013 Fredrich Birks PTA [phone] pager [phone] office

## 2013-08-18 NOTE — Clinical Social Work Placement (Addendum)
    Clinical Social Work Department CLINICAL SOCIAL WORK PLACEMENT NOTE 08/20/2013  Patient:  [name], [name]  Account Number:  [id number] Admit date:  08/10/2013  Clinical Social Worker:  Lupita Leash Gissela Bloch, LCSWA  Date/time:  08/18/2013 11:35 PM  Clinical Social Work is seeking post-discharge placement for this patient at the following level of care:   SKILLED NURSING   (*CSW will update this form in Epic as items are completed)   08/18/2013  Patient/family provided with [facility] Department of Clinical Social Work's list of facilities offering this level of care within the geographic area requested by the patient (or if unable, by the patient's family).  08/18/2013  Patient/family informed of their freedom to choose among providers that offer the needed level of care, that participate in Medicare, Medicaid or managed care program needed by the patient, have an available bed and are willing to accept the patient.  08/18/2013  Patient/family informed of [facility]' ownership interest in [facility], as well as of the fact that they are under no obligation to receive care at this facility.  PASARR submitted to EDS on 08/17/2013 PASARR number received from EDS on 08/18/2013  FL2 transmitted to all facilities in geographic area requested by pt/family on  08/18/2013 FL2 transmitted to all facilities within larger geographic area on   Patient informed that his/her managed care company has contracts with or will negotiate with  certain facilities, including the following:   NA     Patient/family informed of bed offers received:  08/18/2013 Patient chooses bed at [facility] Physician recommends and patient chooses bed at    Patient to be transferred to [facility] on  08/19/2013 Patient to be transferred to facility by Ambulance  Sharin Mons)  The following physician request were entered in Epic:   Additional  Comments: 08/19/13  DC to SNF today. OK per patient, daughter, SNF and patient's nurse who will call report. No further CSW needs identified. CSW signing off.  Lorri Frederick. Mecca Guitron, LCSWA [phone]

## 2013-08-18 NOTE — Progress Notes (Signed)
[facility] PROGRESS NOTE  ASUSENA SIGLEY GNF:[id number] DOB: Apr 08, 1928 DOA: 08/10/2013 PCP: Rene Paci, MD  Brief narrative  79 year old female with history of CVA, arthritis, depression, degenerative disc disease, GERD, hypertension, hypothyroidism, typical for disease, papillary thyroid carcinoma and osteoporosis who presented with progressively sided weakness over past 5 weeks associated with frontal headaches. She also started having frequent falls recently. She was found by her home health nurse to have weakness on her left side and was sent to the ED. A CT scan of the head done in the ED showed a right frontal lobe mass. MRI of the brain was done which showed a large medial right frontal mass with some midline shift. She was started on Decadron. Patient seen by neurosurgery and taken to OR on 12/18 for resection of the brain mass. Biopsy showing glioblastoma.   Assessment/Plan:  High grade Glioblastoma of the brain with left-sided weakness  Symptoms of left-sided weakness now improving post surgery and on Decadron. Followup MRI shows persistent vasogenic edema with improvement in the right-to-left midline shift.  -on tapering  dose of Decadron -appreciate onc consult.recommends adjuvant chemoradiation. radiation oncology consulted. -PT evaluation recommends CIR consult  -Pain control with Vicodin and Ultracet  -Continue Keppra for seizure prophylaxis.  -PPI for GI prophylaxis.   Anxiety/depression  Continue Xanax and Elavil   Hypertension  Continue losartan   Hypothyroidism  Continue Synthroid   Hyperlipidemia  Continue Zocor   Code Status: Full code  Family Communication: None at bedside  Disposition Plan: SNF as per family request. SW consulted   Consultants:  Neurology  Neurosurgery  Oncology  Procedures:  craniotomy with resection of right frontal lobe mass on 12/18   Antibiotics:  None  HPI/Subjective:  left Sided weakness minimal       Objective: Filed Vitals:   08/18/13 1425  BP: 115/69  Pulse: 72  Temp: 97.9 F (36.6 C)  Resp: 18   No intake or output data in the 24 hours ending 08/18/13 1437 Filed Weights   08/11/13 0052 08/13/13 1719 08/14/13 1642  Weight: 79.5 kg (175 lb 4.3 oz) 80.3 kg (177 lb 0.5 oz) 80.1 kg (176 lb 9.4 oz)    Exam: General: Elderly female lying in bed in no acute distress  HEENT: Staples over right craniotomy, No pallor, moist oral mucosa Cardiovascular: Normal S1 and S2, no murmurs or gallop  Chest: Clear to auscultation bilaterally, no added sounds  Abdomen: Soft, nontender, nondistended, bowel sounds present  Extremities: Warm, no edema  CNS: AAO x3, power over left extremity 4+/5   Data Reviewed: Basic Metabolic Panel:  Recent Labs Lab 08/14/13 0600 08/17/13 0533  NA 142 133*  K 4.3 4.1  CL 111 101  CO2 24 23  GLUCOSE 108* 143*  BUN 25* 33*  CREATININE 1.24* 1.25*  CALCIUM 8.0* 8.6   Liver Function Tests: No results found for this basename: AST, ALT, ALKPHOS, BILITOT, PROT, ALBUMIN,  in the last 168 hours No results found for this basename: LIPASE, AMYLASE,  in the last 168 hours No results found for this basename: AMMONIA,  in the last 168 hours CBC:  Recent Labs Lab 08/14/13 0600  WBC 12.3*  HGB 10.6*  HCT 32.7*  MCV 95.9  PLT 217   Cardiac Enzymes: No results found for this basename: CKTOTAL, CKMB, CKMBINDEX, TROPONINI,  in the last 168 hours BNP (last 3 results) No results found for this basename: PROBNP,  in the last 8760 hours CBG: No results found for this  basename: GLUCAP,  in the last 168 hours  Recent Results (from the past 240 hour(s))  URINE CULTURE     Status: None   Collection Time    08/10/13  9:05 PM      Result Value Range Status   Specimen Description URINE, CATHETERIZED   Final   Special Requests NONE   Final   Culture  Setup Time     Final   Value: 08/11/2013 09:04     Performed at [facility] Count      Final   Value: NO GROWTH     Performed at [facility]   Culture     Final   Value: NO GROWTH     Performed at [facility]   Report Status 08/12/2013 FINAL   Final  SURGICAL PCR SCREEN     Status: None   Collection Time    08/12/13 11:30 PM      Result Value Range Status   MRSA, PCR NEGATIVE  NEGATIVE Final   Staphylococcus aureus NEGATIVE  NEGATIVE Final   Comment:            The Xpert SA Assay (FDA     approved for NASAL specimens     in patients over 78 years of age),     is one component of     a comprehensive surveillance     program.  Test performance has     been validated by [employer] for patients greater     than or equal to 82 year old.     It is not intended     to diagnose infection nor to     guide or monitor treatment.     Studies: No results found.  Scheduled Meds: . amitriptyline  25 mg Oral QHS  . dexamethasone  4 mg Oral Q8H  . docusate sodium  100 mg Oral BID  . heparin subcutaneous  5,000 Units Subcutaneous Q8H  . levETIRAcetam  500 mg Oral BID  . levothyroxine  100 mcg Oral QAC breakfast  . losartan  50 mg Oral Daily  . pantoprazole  40 mg Oral Daily  . simvastatin  20 mg Oral q1800  . sodium chloride  3 mL Intravenous Q12H   Continuous Infusions:     Time spent: 25 minutes    Eddie North  [facility] Pager [phone] If 7PM-7AM, please contact night-coverage at [email or web address], password [facility] 08/18/2013, 2:37 PM  LOS: 8 days

## 2013-08-18 NOTE — Progress Notes (Signed)
INITIAL NUTRITION ASSESSMENT  DOCUMENTATION CODES Per approved criteria  -Obesity Unspecified   INTERVENTION: Ensure Complete daily PRN (350 kcals, 13 gm protein per 8 fl oz bottle) RD to follow for nutrition care plan  NUTRITION DIAGNOSIS: Increased nutrient needs related to catabolic illness as evidenced by estimated nutrition needs   Goal: Pt to meet >/= 90% of their estimated nutrition needs   Monitor:  PO & supplemental intake, weight, labs, I/O's  Reason for Assessment: Malnutrition Screening Tool Report  79 y.o. female  Admitting Dx: headache and left leg weakness  ASSESSMENT: Patient with PMH of CVA, arthritis, depression, degenerative disc disease, HTN and osteoporosis who presented with progressively sided weakness over past 5 weeks associated with frontal headaches; CT scan of the head done in the ED showed a right frontal lobe mass.  MRI of the brain showed a large medial right frontal mass with some midline shift.   RD spoke briefly with patient at bedside; s/p bedside swallow evaluation 12/16; reports she's eating well; lunch tray observed -- patient consumed ~ 75%; weight has been stable; patent declined supplements; RD feels would benefit from addition of nutrition supplements on as needed basis -- RD to order.  Radiation Oncology note reviewed -- patient will be scheduled for outpatient visit in 7-10 days and will tentatively start postoperative treatments approximately 3 weeks out from surgery.  Height: Ht Readings from Last 1 Encounters:  08/14/13 5\' 4"  (1.626 m)    Weight: Wt Readings from Last 1 Encounters:  08/14/13 176 lb 9.4 oz (80.1 kg)    Ideal Body Weight: 120 lb  % Ideal Body Weight: 146%  Wt Readings from Last 10 Encounters:  08/14/13 176 lb 9.4 oz (80.1 kg)  08/14/13 176 lb 9.4 oz (80.1 kg)  08/03/13 179 lb 1.3 oz (81.23 kg)  07/18/13 174 lb (78.926 kg)  04/24/13 174 lb 4 oz (79.039 kg)  10/24/12 164 lb 3.2 oz (74.481 kg)  09/22/12  160 lb 12.8 oz (72.938 kg)  12/24/11 169 lb 3.2 oz (76.749 kg)  11/26/11 166 lb (75.297 kg)  11/05/11 167 lb 8 oz (75.978 kg)    Usual Body Weight: 174 lb  % Usual Body Weight: 101%  BMI:  Body mass index is 30.3 kg/(m^2).  Estimated Nutritional Needs: Kcal: 1850-2050 Protein: 85-95 gm Fluid: 1.8-2.0 L  Skin: Intact  Diet Order: Cardiac  EDUCATION NEEDS: -No education needs identified at this time  Labs:   Recent Labs Lab 08/14/13 0600 08/17/13 0533  NA 142 133*  K 4.3 4.1  CL 111 101  CO2 24 23  BUN 25* 33*  CREATININE 1.24* 1.25*  CALCIUM 8.0* 8.6  GLUCOSE 108* 143*    Scheduled Meds: . amitriptyline  25 mg Oral QHS  . dexamethasone  4 mg Oral Q8H  . docusate sodium  100 mg Oral BID  . heparin subcutaneous  5,000 Units Subcutaneous Q8H  . levETIRAcetam  500 mg Oral BID  . levothyroxine  100 mcg Oral QAC breakfast  . losartan  50 mg Oral Daily  . pantoprazole  40 mg Oral Daily  . simvastatin  20 mg Oral q1800  . sodium chloride  3 mL Intravenous Q12H    Continuous Infusions:   Past Medical History  Diagnosis Date  . CVA 1998    "mini" cva, no residual deficts  . ARTHRITIS   . URINARY INCONTINENCE   . DEPRESSION   . DDD (degenerative disc disease)     Cervical spine & knees  .  GERD   . DYSLIPIDEMIA   . HYPERTENSION   . HYPOTHYROIDISM   . PEPTIC ULCER DISEASE 11/2009  . CARCINOMA, THYROID GLAND, PAPILLARY 09/2008    s/p total thyroidectomy  . Osteoporosis     Past Surgical History  Procedure Laterality Date  . Cholecystectomy    . Abdominal hysterectomy    . Thyroidectomy  2/30/10    papillary, s/p total  . Breast biopsy    . Back surgery    . Tonsillectomy    . Appendectomy    . Incontinence surgery    . Wrist surgery      LEFT  . Cardiac catheterization  03/30/2005    NORMAL EF. NORMAL CORONARIES  . Cardiovascular stress test  01/27/2003    NO EVIDENCE OF ISCHEMIA. EF 74%  . US echocardiography  03/16/2009    EF 55-60%  . US  echocardiography  01/26/2003    EF 60-65%    Maureen Chatters, RD, LDN Pager #: [phone] After-Hours Pager #: [phone]

## 2013-08-18 NOTE — Progress Notes (Signed)
Subjective: Patient stable.  No complaints of pain.  Radiation oncology and oncology have evaluated patient.    Objective: Current vital signs: BP 149/61  Pulse 56  Temp(Src) 97.6 F (36.4 C) (Oral)  Resp 18  Ht 5\' 4"  (1.626 m)  Wt 80.1 kg (176 lb 9.4 oz)  BMI 30.30 kg/m2  SpO2 96% Vital signs in last 24 hours: Temp:  [97.6 F (36.4 C)-98.5 F (36.9 C)] 97.6 F (36.4 C) (12/23 0605) Pulse Rate:  [55-58] 56 (12/23 0605) Resp:  [18-20] 18 (12/23 0605) BP: (122-149)/(58-67) 149/61 mmHg (12/23 0605) SpO2:  [96 %-100 %] 96 % (12/23 0605)  Intake/Output from previous day:   Intake/Output this shift:   Nutritional status: Cardiac  Neurologic Exam: Mental Status:  A&O x3, speech fluent, no aphasia. Pt follows commands with minimal difficulty. ? Mild left neglect  Cranial Nerves:  II - visual fields intact, PERRL  III/IV/VI - EOMI  V/VII - no facial numbness or weakness  VIII - hearing intact  IX,X - palate elevates bilaterally, normal speech  XI: trapezius strength/neck flexion strength normal bilaterally  XII: tongue strength normal  Motor: strength 5/5 in right upper and lower extremities. Strength 5-/5 in left upper extremity. Strength 5-/5 in left lower extremity. Normal muscle bulk and tone  Sensory: intact  Deep Tendon Reflexes: 2+ bilaterally  Cerebellar: finger-to-nose test intact on right, though somewhat slower on left      Lab Results: Basic Metabolic Panel:  Recent Labs Lab 08/14/13 0600 08/17/13 0533  NA 142 133*  K 4.3 4.1  CL 111 101  CO2 24 23  GLUCOSE 108* 143*  BUN 25* 33*  CREATININE 1.24* 1.25*  CALCIUM 8.0* 8.6    Liver Function Tests: No results found for this basename: AST, ALT, ALKPHOS, BILITOT, PROT, ALBUMIN,  in the last 168 hours No results found for this basename: LIPASE, AMYLASE,  in the last 168 hours No results found for this basename: AMMONIA,  in the last 168 hours  CBC:  Recent Labs Lab 08/14/13 0600  WBC 12.3*   HGB 10.6*  HCT 32.7*  MCV 95.9  PLT 217    Cardiac Enzymes: No results found for this basename: CKTOTAL, CKMB, CKMBINDEX, TROPONINI,  in the last 168 hours  Lipid Panel: No results found for this basename: CHOL, TRIG, HDL, CHOLHDL, VLDL, LDLCALC,  in the last 168 hours  CBG: No results found for this basename: GLUCAP,  in the last 168 hours  Microbiology: Results for orders placed during the hospital encounter of 08/10/13  URINE CULTURE     Status: None   Collection Time    08/10/13  9:05 PM      Result Value Range Status   Specimen Description URINE, CATHETERIZED   Final   Special Requests NONE   Final   Culture  Setup Time     Final   Value: 08/11/2013 09:04     Performed at [facility]     Final   Value: NO GROWTH     Performed at [facility]   Culture     Final   Value: NO GROWTH     Performed at [facility]   Report Status 08/12/2013 FINAL   Final  SURGICAL PCR SCREEN     Status: None   Collection Time    08/12/13 11:30 PM      Result Value Range Status   MRSA, PCR NEGATIVE  NEGATIVE Final   Staphylococcus  aureus NEGATIVE  NEGATIVE Final   Comment:            The Xpert SA Assay (FDA     approved for NASAL specimens     in patients over 73 years of age),     is one component of     a comprehensive surveillance     program.  Test performance has     been validated by [employer] for patients greater     than or equal to 67 year old.     It is not intended     to diagnose infection nor to     guide or monitor treatment.    Coagulation Studies: No results found for this basename: LABPROT, INR,  in the last 72 hours  Imaging: No results found.  Medications:  I have reviewed the patient's current medications. Scheduled: . amitriptyline  25 mg Oral QHS  . dexamethasone  4 mg Oral Q8H  . docusate sodium  100 mg Oral BID  . heparin subcutaneous  5,000 Units Subcutaneous Q8H  . levETIRAcetam  500 mg Oral BID   . levothyroxine  100 mcg Oral QAC breakfast  . losartan  50 mg Oral Daily  . pantoprazole  40 mg Oral Daily  . simvastatin  20 mg Oral q1800  . sodium chloride  3 mL Intravenous Q12H    Assessment/Plan: 79 year old with GBM.  Oncology and radiation oncology involved and directing care.  Patient on Keppra and tolerating well.    Recommendations: 1.  Would continue on Keppra at current dose.   2.  No further neurologic intervention is recommended at this time.  If further questions arise, please call or page at that time.  Thank you for allowing neurology to participate in the care of this patient.    LOS: 8 days   Alexis Goodell, MD [facility] [phone] 08/18/2013  10:52 AM

## 2013-08-18 NOTE — Progress Notes (Signed)
CSW met with patient this afternoon to discuss bed search and plan for short term SNF. Patient agrees to SNF but wants to defer to her daughter  [name] re: bed choice.  CSW was unable to talk with daughter today but will attempt in the a.m.  PASARR number now in place; bed offers in place.  Will discuss with MD if he feels patient is medically stable for d/c.  Fl2 on chart for MD's signature.  MD: is patient ready for d/c?  Please advise.  Thanks!  Marland Kitchendcsign

## 2013-08-19 ENCOUNTER — Encounter (HOSPITAL_COMMUNITY): Payer: Self-pay | Admitting: Neurosurgery

## 2013-08-19 ENCOUNTER — Telehealth: Payer: Self-pay | Admitting: Hematology & Oncology

## 2013-08-19 MED ORDER — LEVETIRACETAM 500 MG PO TABS: 500.0000 mg | ORAL_TABLET | Freq: Two times a day (BID) | ORAL | Status: DC

## 2013-08-19 MED ORDER — TRAMADOL-ACETAMINOPHEN 37.5-325 MG PO TABS: 1.0000 | ORAL_TABLET | Freq: Four times a day (QID) | ORAL | Status: DC | PRN

## 2013-08-19 MED ORDER — DEXAMETHASONE 4 MG PO TABS: 4.0000 mg | ORAL_TABLET | ORAL | Status: DC

## 2013-08-19 MED ORDER — ENSURE COMPLETE PO LIQD: 237.0000 mL | Freq: Every day | ORAL | Status: DC | PRN

## 2013-08-19 MED ORDER — DSS 100 MG PO CAPS: 100.0000 mg | ORAL_CAPSULE | Freq: Two times a day (BID) | ORAL | Status: DC

## 2013-08-19 NOTE — Discharge Summary (Signed)
Physician Discharge Summary  [name] VOZ:[id number] DOB: 01-18-1928 DOA: 08/10/2013  PCP: Rene Paci, MD  Admit date: 08/10/2013 Discharge date: 08/19/2013  Time spent: 40 minutes  Recommendations for Outpatient Follow-up:  SNF Follow up with neurosurgery, oncology and radiation oncology as scheduled outpatient  Discharge Diagnoses:  Principal Problem:   Glioblastoma multiforme of brain,  grade IV  Active Problems:   HYPOTHYROIDISM   HYPERTENSION   Hemiplegia affecting nondominant side   Brain tumor   Discharge Condition: fair  Diet recommendation: low sodium  Filed Weights   08/11/13 0052 08/13/13 1719 08/14/13 1642  Weight: 79.5 kg (175 lb 4.3 oz) 80.3 kg (177 lb 0.5 oz) 80.1 kg (176 lb 9.4 oz)    History of present illness:  Please refer to admission h&P for details, but in brief, 79 year old female with history of CVA, arthritis, depression, degenerative disc disease, GERD, hypertension, hypothyroidism, typical for disease, papillary thyroid carcinoma and osteoporosis who presented with progressively sided weakness over past 5 weeks associated with frontal headaches. She also started having frequent falls recently. She was found by her home health nurse to have weakness on her left side and was sent to the ED. A CT scan of the head done in the ED showed a right frontal lobe mass. MRI of the brain was done which showed a large medial right frontal mass with some midline shift. She was started on Decadron. Patient seen by neurosurgery and taken to OR on 12/18 for resection of the brain mass. Biopsy showing high grade  Glioblastoma.   Hospital Course:  High grade Glioblastoma of the brain with left-sided weakness  Symptoms of left-sided weakness now improving post surgery and on Decadron. Followup MRI shows persistent vasogenic edema with improvement in the right-to-left midline shift.  -on tapering dose of Decadron. Will follow with neurosurgery in 1  week for staples removal.  -appreciate onc consult.recommends adjuvant chemoradiation. radiation oncology will schedule for radiation in 1 week.  -PT evaluation recommends SNF -Pain control with  Ultracet  -Continue Keppra for seizure prophylaxis.  -PPI for GI prophylaxis.   Anxiety/depression  Continue Xanax and Elavil   Hypertension  Continue losartan   Hypothyroidism  Continue Synthroid   Hyperlipidemia  Continue Zocor   Code Status: Full code  Family Communication: None at bedside  Disposition Plan: SNF   Consultants:  Neurology  Neurosurgery  Oncology  radiation oncology   Procedures:  craniotomy with resection of right frontal lobe mass on 12/18    Antibiotics:  None    Discharge Exam: Filed Vitals:   08/19/13 0550  BP: 120/77  Pulse: 54  Temp: 98 F (36.7 C)  Resp: 20  General: Elderly female lying in bed in no acute distress  HEENT: Staples over right craniotomy, No pallor, moist oral mucosa  Cardiovascular: Normal S1 and S2, no murmurs or gallop  Chest: Clear to auscultation bilaterally, no added sounds  Abdomen: Soft, nontender, nondistended, bowel sounds present  Extremities: Warm, no edema  CNS: AAO x3, power over left extremity 4+/5   Discharge Instructions   Future Appointments Provider Department Dept Phone   09/23/2013 2:00 PM Newt Lukes, MD [facility] [phone]       Medication List         ALPRAZolam 0.25 MG tablet  Commonly known as:  XANAX  Take 1 tablet (0.25 mg total) by mouth 3 (three) times daily as needed for anxiety.     amitriptyline 25 MG tablet  Commonly  known as:  ELAVIL  Take 1 tablet (25 mg total) by mouth at bedtime.     aspirin 81 MG tablet  Take 81 mg by mouth daily.     clopidogrel 75 MG tablet  Commonly known as:  PLAVIX  Take 1 tablet (75 mg total) by mouth daily with breakfast.     dexamethasone 4 MG tablet  Commonly known as:  DECADRON  Take 1 tablet (4 mg  total) by mouth as directed.( 4 mg tid for 1 days,then 4 mg bid for 4 days, then 2 mg bid for next 4 days, then 2 mg daily for next 4 days then stop)     DSS 100 MG Caps  Take 100 mg by mouth 2 (two) times daily.     feeding supplement (ENSURE COMPLETE) Liqd  Take 237 mLs by mouth daily as needed (suboptimal intake).     fluocinonide-emollient 0.05 % cream  Commonly known as:  LIDEX-E  Apply 1 application topically 2 (two) times a week.     indomethacin 25 MG capsule  Commonly known as:  INDOCIN  Take 25 mg by mouth daily as needed for moderate pain.     levETIRAcetam 500 MG tablet  Commonly known as:  KEPPRA  Take 1 tablet (500 mg total) by mouth 2 (two) times daily.     levothyroxine 100 MCG tablet  Commonly known as:  SYNTHROID, LEVOTHROID  Take 1 tablet (100 mcg total) by mouth daily.     losartan 50 MG tablet  Commonly known as:  COZAAR  Take 1 tablet (50 mg total) by mouth daily.     methocarbamol 500 MG tablet  Commonly known as:  ROBAXIN  Take 500-1,000 mg by mouth every 8 (eight) hours as needed for muscle spasms.     pantoprazole 40 MG tablet  Commonly known as:  PROTONIX  Take 40 mg by mouth 2 (two) times daily.     pravastatin 40 MG tablet  Commonly known as:  PRAVACHOL  Take 40 mg by mouth daily.     promethazine 25 MG tablet  Commonly known as:  PHENERGAN  Take 25 mg by mouth every 8 (eight) hours as needed for nausea or vomiting.     traMADol-acetaminophen 37.5-325 MG per tablet  Commonly known as:  ULTRACET  Take 1 tablet by mouth every 6 (six) hours as needed for moderate pain.       Allergies  Allergen Reactions  . Iodine     REACTION: Nausea       Follow-up Information   Follow up with Rene Paci, MD In 1 week. (after dsicharge from rehab)    Specialty:  Internal Medicine   Contact information:   [address] [place] [address] [phone]       Follow up with Josph Macho, MD In 1 month.    Specialty:  Oncology   Contact information:   [address] [place] [address] [phone]       Follow up with Hewitt Shorts, MD. Call in 1 week. (for f/up and staple removal)    Specialty:  Neurosurgery   Contact information:   [address] [place] [address] [phone]       Follow up with Billie Lade, MD In 1 week.   Specialty:  Radiation Oncology   Contact information:   [address] [place] [address] [phone]  The results of significant diagnostics from this hospitalization (including imaging, microbiology, ancillary and laboratory) are listed below for reference.    Significant Diagnostic Studies: Ct Abdomen Pelvis Wo Contrast  08/11/2013   CLINICAL DATA:  Recently diagnosed brain neoplasm this study is performed for staging evaluation  EXAM: CT CHEST WITHOUT CONTRAST; CT ABDOMEN AND PELVIS WITHOUT CONTRAST  TECHNIQUE: Multidetector CT imaging of the chest was performed following the standard protocol without IV contrast.; Multidetector CT imaging of the abdomen and pelvis was performed following the standard protocol without intravenous contrast.  COMPARISON:  None.  FINDINGS: Noncontrast evaluation thoracic inlet is unremarkable.  There is no evidence of mediastinal nor hilar adenopathy nor masses. Trace bilateral pleural effusions are appreciated. A very small hiatal hernia is identified. There is mild-to-moderate multichamber cardiac enlargement. Coronary artery atherosclerotic calcifications are identified as well as atherosclerotic calcifications within the aorta. There is no evidence of a thoracic aortic aneurysm.  There is small bibasilar pleural effusions are identified as well as minimal atelectasis versus trace infiltrates. There is no evidence of a pulmonary masses or nodules. Small calcified pleural plaques are identified posteriorly within the lung apices.  There is no evidence of pulmonary nodules, masses, infiltrates. The central airways are patent.  Noncontrast evaluation of the liver demonstrates very small punctate calcified densities within the dome the right lobe of the liver. The liver parenchyma is otherwise unremarkable. Patient status post cholecystectomy. The spleen, adrenals, pancreas, kidneys are unremarkable.  There is no evidence of bowel obstruction. There no secondary signs reflecting enteritis, colitis, diverticulitis, normal appendicitis.  There is no evidence of abdominal or pelvic adenopathy, masses, nor free fluid. The urinary bladder is distended and appears to be filled with urine.  The osseous structures demonstrate multilevel multifactorial degenerative changes throughout the thoracolumbar spine without evidence of aggressive appearing lytic or sclerotic lesions.  There is no evidence of an abdominal aortic aneurysm. Atherosclerotic calcifications are identified within the abdominal aorta. The  IMPRESSION: 1. Trace bilateral pleural effusions with findings likely representing mild areas of atelectasis within the lung bases. Minimal infiltrates and clinically appropriate cannot be excluded. Otherwise no evidence of pulmonary nodules nor masses. 2. There is no evidence of masses nor nodules within the chest abdomen or pelvis. There is no evidence of pathologic size adenopathy. 3. Moderate amount of fecal retention without evidence of obstructive or inflammatory abnormalities. 4. Small calcified pleural plaques within the lung apices findings may represent sequela prior asbestos exposure. 5. Urinary bladder is dilated consistent with the patient's history of urinary incontinence.   Electronically Signed   By: Salome Holmes M.D.   On: 08/11/2013 13:49   Ct Head Wo Contrast  08/13/2013   CLINICAL DATA:  Brain tumor.  Abnormal MRI scan.  Surgical planning.  EXAM: CT HEAD WITHOUT CONTRAST  TECHNIQUE: Contiguous axial images were obtained from  the base of the skull through the vertex without intravenous contrast.  COMPARISON:  MRI brain 08/11/2013  FINDINGS: The anterior right frontal lobe mass lesion measures 3.8 x 4.0 cm without IV contrast. Extensive surrounding vasogenic edema is again noted, extending into the external capsule. There are no significant calcifications associated with the lesion. Midline shift is again noted, measuring up to 9 mm. No significant extra-axial fluid collections are present. There is asymmetric effacement of the sulci on the right.  The paranasal sinuses and mastoid air cells are clear. The osseous skull is intact.  IMPRESSION: 1. Right frontal lobe tumor localized for surgical planning purposes. As previously  stated, this most likely represents a high-grade primary dizziness neoplasm such as a the GBM. 2. Diffuse vasogenic edema as described. 3. Persistent midline shift of 9 mm.   Electronically Signed   By: Lawrence Santiago M.D.   On: 08/13/2013 12:26   Ct Head Wo Contrast  08/10/2013   CLINICAL DATA:  Dizziness and headache  EXAM: CT HEAD WITHOUT CONTRAST  TECHNIQUE: Contiguous axial images were obtained from the base of the skull through the vertex without intravenous contrast. Study was performed within 24 hr of patient's arrival at the emergency department.  COMPARISON:  June 30, 2008  FINDINGS: There is widespread vasogenic edema throughout the right frontal lobe extending into the anterior right parietal lobe. Within the right frontal lobe, there is a masslike area measuring 4.0 by 3.3 cm. There is marked effacement of the lateral ventricles with midline shift toward the left by approximately 16 mm.  There is no hemorrhage. There is no subdural or epidural fluid. Bony calvarium appears intact. The mastoid air cells are clear.  IMPRESSION: Findings felt to represent mass in the right frontal lobe with extensive surrounding vasogenic edema which effaces the lateral ventricles and causes midline shift toward the  left. Neoplasm is the most likely etiology for this finding, although abscess is a differential consideration on this study. Given these findings, contrast enhanced CT or pre and post-contrast MR to further assess would be warranted.  Critical Value/emergent results were called by telephone at the time of interpretation on 08/10/2013 at 8:22 PM to Dr. Tanna Furry , who verbally acknowledged these results.   Electronically Signed   By: Lowella Grip M.D.   On: 08/10/2013 20:23   Ct Chest Wo Contrast  08/11/2013   CLINICAL DATA:  Recently diagnosed brain neoplasm this study is performed for staging evaluation  EXAM: CT CHEST WITHOUT CONTRAST; CT ABDOMEN AND PELVIS WITHOUT CONTRAST  TECHNIQUE: Multidetector CT imaging of the chest was performed following the standard protocol without IV contrast.; Multidetector CT imaging of the abdomen and pelvis was performed following the standard protocol without intravenous contrast.  COMPARISON:  None.  FINDINGS: Noncontrast evaluation thoracic inlet is unremarkable.  There is no evidence of mediastinal nor hilar adenopathy nor masses. Trace bilateral pleural effusions are appreciated. A very small hiatal hernia is identified. There is mild-to-moderate multichamber cardiac enlargement. Coronary artery atherosclerotic calcifications are identified as well as atherosclerotic calcifications within the aorta. There is no evidence of a thoracic aortic aneurysm.  There is small bibasilar pleural effusions are identified as well as minimal atelectasis versus trace infiltrates. There is no evidence of a pulmonary masses or nodules. Small calcified pleural plaques are identified posteriorly within the lung apices. There is no evidence of pulmonary nodules, masses, infiltrates. The central airways are patent.  Noncontrast evaluation of the liver demonstrates very small punctate calcified densities within the dome the right lobe of the liver. The liver parenchyma is otherwise  unremarkable. Patient status post cholecystectomy. The spleen, adrenals, pancreas, kidneys are unremarkable.  There is no evidence of bowel obstruction. There no secondary signs reflecting enteritis, colitis, diverticulitis, normal appendicitis.  There is no evidence of abdominal or pelvic adenopathy, masses, nor free fluid. The urinary bladder is distended and appears to be filled with urine.  The osseous structures demonstrate multilevel multifactorial degenerative changes throughout the thoracolumbar spine without evidence of aggressive appearing lytic or sclerotic lesions.  There is no evidence of an abdominal aortic aneurysm. Atherosclerotic calcifications are identified within the abdominal aorta. The  IMPRESSION: 1. Trace bilateral pleural effusions with findings likely representing mild areas of atelectasis within the lung bases. Minimal infiltrates and clinically appropriate cannot be excluded. Otherwise no evidence of pulmonary nodules nor masses. 2. There is no evidence of masses nor nodules within the chest abdomen or pelvis. There is no evidence of pathologic size adenopathy. 3. Moderate amount of fecal retention without evidence of obstructive or inflammatory abnormalities. 4. Small calcified pleural plaques within the lung apices findings may represent sequela prior asbestos exposure. 5. Urinary bladder is dilated consistent with the patient's history of urinary incontinence.   Electronically Signed   By: Salome Holmes M.D.   On: 08/11/2013 13:49   Mr Brain Wo Contrast  08/10/2013   CLINICAL DATA:  Abnormal CT, mass.  EXAM: MRI HEAD WITHOUT CONTRAST  TECHNIQUE: Multiplanar, multiecho pulse sequences of the brain and surrounding structures were obtained without intravenous contrast. Please note, due to a low GFR of 39, no contrast was administered.  COMPARISON:  CT of the head August 10, 2012 at 2012 hr. MRI of the brain May 18, 2005  FINDINGS: Heterogeneous right frontal lobe 3.9 x 4.5 x  4.1 cm intraparenchymal mass. Associated reduced diffusion, with the lower signal superior component, the T2 bright more cystic component inferiorly shows less reduced diffusion, there is corresponding low ADC values. Surrounding T2 bright vasogenic edema, with local mass effect, effacing the sulci, with 13 mm of right to left subfalcine herniation. No intrinsic T1 shortening. Due to mass effect, the anterior cerebral arteries are displaced to the left. Minimal susceptibility artifact along the inferior aspect of the mass.  No hydrocephalus or ventricular entrapment. A few subcentimeter scattered supratentorial white matter T2 hyperintensities exclusive of the right frontal lobe process suggest sequela of chronic small vessel ischemic disease. Remote tiny right cerebellar lacunar infarct. No abnormal extra-axial fluid collections.  Status post bilateral ocular lens implants. Visualized paranasal sinuses and mastoid air cells appear well-aerated. No suspicious calvarial bone marrow signal. No abnormal sellar expansion. Craniocervical junction maintained.  IMPRESSION: 3.9 x 4.5 x 4.1 cm right frontal lobe intraparenchymal heterogeneous mass (the cystic component may reflect degeneration) with imaging characteristics highly concerning for hypercellular tumor such as glioblastoma multiforme, this less likely reflects abscess, or metastasis. Extensive surrounding FLAIR abnormality may reflect edema and, possibly infiltrating tumor. 1.3 cm of right to left subfalcine herniation, displacing the anterior cerebral arteries to the left, without ACA territory infarct.  Critical Value/emergent results were called by telephone at the time of interpretation on 08/10/2013 at 11:06 PM to Dr. Rolland Porter , who verbally acknowledged these results.   Electronically Signed   By: Awilda Metro   On: 08/10/2013 23:07   Mr Brain W Wo Contrast  08/15/2013   CLINICAL DATA:  Followup stereotactic right frontal craniotomy for resection  of tumor  EXAM: MRI HEAD WITHOUT AND WITH CONTRAST  TECHNIQUE: Multiplanar, multiecho pulse sequences of the brain and surrounding structures were obtained without and with intravenous contrast.  CONTRAST:  15mL MULTIHANCE GADOBENATE DIMEGLUMINE 529 MG/ML IV SOLN  COMPARISON:  Prior MRI from 08/11/2013 and CT from 08/13/2013  FINDINGS: Postoperative changes from interval right frontal craniotomy are seen. Skin staples overlie the right frontal scalp. There is swelling and edema within the scalp soft tissues overlying the craniotomy site. T2 hyperintense signal intensity with scattered blood products are seen within the surgical resection bed within the right frontal lobe. A 1.6 x 1.3 cm T1 hyperintense, T2 hypointense nonenhancing ovoid region seen at the lateral  margin the resection cavity likely represents a small hematoma. Peripheral hypo intense signal intensity seen about this area on the gradient echo sequence. Hypointense signal intensity seen layering along the primary resection cavity on gradient echo sequence is also compatible with blood products. There is increased prominence of the extra axial space overlying the right frontal lobe as compared to prior exam, likely related to tumor debulking/resection. No extra-axial fluid collection/hematoma identified.  There is mild peripheral post-contrast enhancement about the posterior and lateral margin of the resection cavity, most evident on series 11, image 45. This may be related to postoperative changes. No definite residual tumor is identified. Enhancement of the overlying dura is also present, likely reactive. Associated vasogenic edema within the right frontal lobe persists but is overall slightly improved relative to the prior exam. Edema is again seen crossing the corpus callosum. Secondary right-to-left midline shift is improved now measuring approximately 1.2 cm, previously 1.8 cm. The basilar cisterns remain patent. No hydrocephalus.  A 7 mm focus of  restricted diffusion is seen at the posterior margin of the resection cavity in the right frontal lobe (series 5, image 24), consistent with infarct. In addition, serpiginous restricted diffusion is seen along the posterolateral margin of the resection cavity (series 5, image 25). No other diffusion abnormality identified. Normal flow voids are seen within the intracranial vasculature.  No new mass or other acute abnormality identified within the brain.  Paranasal sinuses and mastoid air cells remain clear.  IMPRESSION: 1. Postoperative changes from interval right frontal craniotomy with resection of right frontal lobe mass without complication. 2. Mild peripheral rim enhancement about the resection cavity, most prevalent along its posteroateral margin, which may be postoperative in nature. No definite residual tumor identified. This study will serve as a baseline for future examinations. 3. Persistent but mildly improved vasogenic edema throughout the right frontal lobe with slightly improved right-to-left midline shift now measuring 1.2 cm, previously 1.8 cm. No hydrocephalus. 4. Restricted diffusion along the posterolateral margin of the resection cavity within the right frontal lobe, consistent with small infarcts.   Electronically Signed   By: Rise Mu M.D.   On: 08/15/2013 04:56   Mr Laqueta Jean UJ Contrast  08/11/2013   CLINICAL DATA:  Evaluate frontal tumor.  EXAM: MRI HEAD WITHOUT AND WITH CONTRAST  TECHNIQUE: Multiplanar, multiecho pulse sequences of the brain and surrounding structures were obtained without and with intravenous contrast.  CONTRAST:  8mL MULTIHANCE GADOBENATE DIMEGLUMINE 529 MG/ML IV SOLN. Half dose contrast was utilized secondary to patient's recent low GFR.  COMPARISON:  08/10/2013 MR and CTA.  FINDINGS: 5.1 x 4.6 x 4.5 cm anterior medial right frontal lobe intra-axial appearing partially necrotic mass with peripheral enhancement with marked surrounding vasogenic edema. This  causing significant compression of the lateral ventricle inferiorly and to left with 1.1 cm midline shift to left. Findings are suspicious for primary tumor such as glioblastoma. Despite the restricted motion, abscess is felt to be a much less likely consideration. Solitary metastatic lesion felt unlikely although not excluded.  The edema extends into the corpus callosum to left of midline. This surrounding vasogenic edema may contain tumor cells without surrounding enhancement.  Marked compression of the anterior cerebral arteries to left.  Mild small vessel disease type changes.  A tiny amount of hemorrhage within the right frontal mass cannot be excluded. No other areas of intracranial hemorrhage noted.  Small left vertebral artery. Major intracranial vascular structures are patent.  Degenerative changes C1-2 articulation or transverse ligament  hypertrophy.  IMPRESSION: IMPRESSION  5.1 x 4.6 x 4.5 cm anterior medial right frontal lobe intra-axial appearing partially necrotic mass with peripheral enhancement with marked surrounding vasogenic edema. This causing significant compression of the lateral ventricle inferiorly and to left with 1.1 cm midline shift to left. Findings are suspicious for primary tumor such as glioblastoma. Despite the restricted motion, abscess is felt to be a much less likely consideration. Solitary metastatic lesion felt unlikely although not excluded.  The edema extends into the corpus callosum to left of midline. This surrounding vasogenic edema may contain tumor cells without surrounding enhancement.  Marked compression of the anterior cerebral arteries to left.  Despite the broad base along the periphery of the brain, this mass appears intra-axial (as best appreciated on high-resolution slightly motion degraded coronal T2 images).   Electronically Signed   By: Chauncey Cruel M.D.   On: 08/11/2013 21:42    Microbiology: Recent Results (from the past 240 hour(s))  URINE CULTURE      Status: None   Collection Time    08/10/13  9:05 PM      Result Value Range Status   Specimen Description URINE, CATHETERIZED   Final   Special Requests NONE   Final   Culture  Setup Time     Final   Value: 08/11/2013 09:04     Performed at [facility] Count     Final   Value: NO GROWTH     Performed at [facility]   Culture     Final   Value: NO GROWTH     Performed at [facility]   Report Status 08/12/2013 FINAL   Final  SURGICAL PCR SCREEN     Status: None   Collection Time    08/12/13 11:30 PM      Result Value Range Status   MRSA, PCR NEGATIVE  NEGATIVE Final   Staphylococcus aureus NEGATIVE  NEGATIVE Final   Comment:            The Xpert SA Assay (FDA     approved for NASAL specimens     in patients over 11 years of age),     is one component of     a comprehensive surveillance     program.  Test performance has     been validated by [employer] for patients greater     than or equal to 53 year old.     It is not intended     to diagnose infection nor to     guide or monitor treatment.     Labs: Basic Metabolic Panel:  Recent Labs Lab 08/14/13 0600 08/17/13 0533  NA 142 133*  K 4.3 4.1  CL 111 101  CO2 24 23  GLUCOSE 108* 143*  BUN 25* 33*  CREATININE 1.24* 1.25*  CALCIUM 8.0* 8.6   Liver Function Tests: No results found for this basename: AST, ALT, ALKPHOS, BILITOT, PROT, ALBUMIN,  in the last 168 hours No results found for this basename: LIPASE, AMYLASE,  in the last 168 hours No results found for this basename: AMMONIA,  in the last 168 hours CBC:  Recent Labs Lab 08/14/13 0600  WBC 12.3*  HGB 10.6*  HCT 32.7*  MCV 95.9  PLT 217   Cardiac Enzymes: No results found for this basename: CKTOTAL, CKMB, CKMBINDEX, TROPONINI,  in the last 168 hours BNP: BNP (last 3 results) No results found for this  basename: PROBNP,  in the last 8760 hours CBG: No results found for this basename: GLUCAP,  in the last  168 hours     Signed:  Eddie North  [facility] 08/19/2013, 8:57 AM

## 2013-08-19 NOTE — Telephone Encounter (Signed)
[facility] from nursing home called scheduled 09-18-13 appointment for hospital follow up

## 2013-08-19 NOTE — Progress Notes (Signed)
Physical Therapy Treatment Patient Details Name: [name] MRN: [id number] DOB: 03-24-1928 Today's Date: 08/19/2013 Time: 4098-1191 PT Time Calculation (min): 26 min  PT Assessment / Plan / Recommendation  History of Present Illness HPI: [name] is a 79 y.o. female with a history of increasing confusion and weakness over the past few months who was found in the ED to have a large right frontal mass. She has been making errors with her checking account and acting confused as well. She presented today after a home health nurse checked on her and found her to be weak on the left and advised her to come to the ED. Pt s/p tumor resection   PT Comments   Safety and judgement continues to be patients main concern at this time. Patient continues to make gains with mobility. Awaiting SNF placement at this time as patient lives alone and is not safe. Continue with PT POC  Follow Up Recommendations  SNF     Does the patient have the potential to tolerate intense rehabilitation     Barriers to Discharge        Equipment Recommendations  Rolling walker with 5" wheels    Recommendations for Other Services    Frequency Min 4X/week   Progress towards PT Goals Progress towards PT goals: Progressing toward goals  Plan Current plan remains appropriate    Precautions / Restrictions Precautions Precautions: Fall   Pertinent Vitals/Pain no apparent distress     Mobility  Bed Mobility Supine to Sit: 5: Supervision;With rails Transfers Sit to Stand: With upper extremity assist;From bed;5: Supervision Stand to Sit: To chair/3-in-1;With upper extremity assist;5: Supervision Ambulation/Gait Ambulation/Gait Assistance: 5: Supervision Ambulation Distance (Feet): 250 Feet Ambulation/Gait Assistance Details: Patient needing VC for positoning of RW and to avoid obstacles with RW Gait Pattern: Step-through pattern;Decreased stride length    Exercises     PT Diagnosis:    PT  Problem List:   PT Treatment Interventions:     PT Goals (current goals can now be found in the care plan section)    Visit Information  Last PT Received On: 08/19/13 Assistance Needed: +1 History of Present Illness: HPI: [name] is a 79 y.o. female with a history of increasing confusion and weakness over the past few months who was found in the ED to have a large right frontal mass. She has been making errors with her checking account and acting confused as well. She presented today after a home health nurse checked on her and found her to be weak on the left and advised her to come to the ED. Pt s/p tumor resection    Subjective Data      Cognition  Cognition Arousal/Alertness: Awake/alert Behavior During Therapy: WFL for tasks assessed/performed Overall Cognitive Status: Impaired/Different from baseline Area of Impairment: Problem solving;Memory;Following commands Memory: Decreased short-term memory Following Commands: Follows multi-step commands inconsistently Safety/Judgement: Decreased awareness of safety Problem Solving: Slow processing;Requires verbal cues;Requires tactile cues    Balance  Dynamic Standing Balance Dynamic Standing - Level of Assistance: 4: Min assist Dynamic Standing - Balance Activities: Reaching for objects;Reaching across midline;Forward lean/weight shifting;Lateral lean/weight shifting  End of Session PT - End of Session Equipment Utilized During Treatment: Gait belt Activity Tolerance: Patient tolerated treatment well Patient left: in chair;with call bell/phone within reach Nurse Communication: Mobility status   GP     Fredrich Birks 08/19/2013, 9:17 AM 08/19/2013 Fredrich Birks PTA [phone] pager [phone] office

## 2013-08-19 NOTE — Progress Notes (Signed)
OT Cancellation Note  Patient Details Name: [name] MRN: [id number] DOB: Jan 03, 1928   Cancelled Treatment:    Reason Eval/Treat Not Completed: Other (comment) (Pt planning to d/c to SNF today.)  Earlie Raveling OTR/L [phone] 08/19/2013, 12:42 PM

## 2013-08-24 ENCOUNTER — Other Ambulatory Visit: Payer: Self-pay | Admitting: *Deleted

## 2013-08-24 MED ORDER — TRAMADOL-ACETAMINOPHEN 37.5-325 MG PO TABS: ORAL_TABLET | ORAL | Status: DC

## 2013-08-24 MED ORDER — ALPRAZOLAM 0.25 MG PO TABS: ORAL_TABLET | ORAL | Status: DC

## 2013-08-26 ENCOUNTER — Other Ambulatory Visit: Payer: Self-pay | Admitting: Hematology & Oncology

## 2013-08-26 DIAGNOSIS — C719 Malignant neoplasm of brain, unspecified: Secondary | ICD-10-CM

## 2013-08-28 ENCOUNTER — Non-Acute Institutional Stay (SKILLED_NURSING_FACILITY): Payer: Medicare Other | Admitting: Internal Medicine

## 2013-08-28 DIAGNOSIS — B370 Candidal stomatitis: Secondary | ICD-10-CM

## 2013-08-28 DIAGNOSIS — C719 Malignant neoplasm of brain, unspecified: Secondary | ICD-10-CM

## 2013-08-28 DIAGNOSIS — R269 Unspecified abnormalities of gait and mobility: Secondary | ICD-10-CM

## 2013-08-28 NOTE — Progress Notes (Signed)
Patient ID: [name], female   DOB: Dec 25, 1927, 80 y.o.   MRN: [id number]  Facility; [facility] complaint; admission to SNF post admit to [facility] from 12/15  to 12/24   History; this is a 80 year old woman who lives with her daughter in [place] in her own home. Claims to be perfectly independent and only ill for 2-3 weeks prior to the admission when she developed increasing gait ataxia and falls. She apparently was noted to have left-sided weakness on presentation to her physician. A CT scan demonstrated a right frontal tumor. Workup with sudden not suggestive of metastatic disease therefore she was felt to have a primary brain tumor. She underwent a craniotomy with resection. Unfortunately the pathology showed a glioblastoma, WHO grade 4. She is made a fairly nice recovery in terms of her left-sided weakness. She is now able to ambulate with a walker with minimal assistance. She is already been seen by Dr. Sondra Come of radiation oncology and set up for operative radiation  Past Medical History  Diagnosis Date  . CVA 1998    "mini" cva, no residual deficts  . ARTHRITIS   . URINARY INCONTINENCE   . DEPRESSION   . DDD (degenerative disc disease)     Cervical spine & knees  . GERD   . DYSLIPIDEMIA   . HYPERTENSION   . HYPOTHYROIDISM   . PEPTIC ULCER DISEASE 11/2009  . CARCINOMA, THYROID GLAND, PAPILLARY 09/2008    s/p total thyroidectomy  . Osteoporosis    Past Surgical History  Procedure Laterality Date  . Cholecystectomy    . Abdominal hysterectomy    . Thyroidectomy  2/30/10    papillary, s/p total  . Breast biopsy    . Back surgery    . Tonsillectomy    . Appendectomy    . Incontinence surgery    . Wrist surgery      LEFT  . Cardiac catheterization  03/30/2005    NORMAL EF. NORMAL CORONARIES  . Cardiovascular stress test  01/27/2003    NO EVIDENCE OF ISCHEMIA. EF 74%  . US echocardiography  03/16/2009    EF 55-60%  . US echocardiography  01/26/2003    EF 60-65%   . Craniotomy Right 08/13/2013    Procedure: RIGHT FRONTAL CRANIOTOMY FOR RESECTION OF TUMOR;  Surgeon: Consuella Lose, MD;  Location: [facility];  Service: Neurosurgery;  Laterality: Right;  right    Medications; dexamethasone on a taper, Colace 100 twice a day, Keppra 500 twice a day, Xanax 0.25 point 3 times daily when necessary, Elavil 25 each bedtime, ASA 81 daily, Plavix 75 daily, Indocin 25 mg daily as needed for moderate pain, Synthroid 100 mcg daily, Cozaar 50 mg daily, Robaxin 500 every 8 when necessary, protonic 40 twice a day, Pravachol 40 daily, Phenergan 25 mg q. 8H when necessary  Social; patient lived in [place] in her own home with her daughter. Describes herself as independent as recently as 3 weeks PTA.  has a past medical history of CVA (1998); ARTHRITIS; URINARY INCONTINENCE; DEPRESSION; DDD (degenerative disc disease); GERD; DYSLIPIDEMIA; HYPERTENSION; HYPOTHYROIDISM; PEPTIC ULCER DISEASE (11/2009); CARCINOMA, THYROID GLAND, PAPILLARY (09/2008); and Osteoporosis.  family history includes Arthritis in her father and mother; Breast cancer in her maternal grandmother; Cancer in her sister; Diabetes in her father and mother; Hyperlipidemia in her father and mother.  Review of systems Gen. patient states she has a severe sore throat and nasal drainage. Respiratory nasal drainage  Cardiac; no chest pain  Abdomen; describes herself is currently constipated GU; marked urge incontinence type symptoms follows with [facility]; does not describe headache vision changes or dysphagia  Physical exam Gen. the patient appears to be in no distress HEENT; on her bilateral oral pharynx is a possible candidal area in the tonsillar areas bilaterally. Her incision is well opposed no problems here Respiratory; clear air entry bilaterally. Cardiac; she appears to be euvolemic heart sounds are normal no murmurs Abdomen; slightly distended but no masses no liver no spleen GU  bladder is not distended clinically Neurologic; she has no visual field deficit,  No pronator drift, she is able to bring herself to a standing position, her gait is somewhat wide-based however all in all she appears to be making a good recovery  Impression/plan #1 glioblastoma in the right frontal lobe status post resection. She is due to have postop radiation under Dr. Sondra Come. A medical oncology consult was also suggested which we will arrange #2 history of a TIA on Plavix #3 possible thrush of the oral pharynx as described above we'll give her Diflucan #4 hyperthyroidism at 1 point, sounds as though she received I-131 and now is on Synthroid #5 constipation will give her MiraLax when necessary  Patient appears to be making a nice recovery.

## 2013-08-31 ENCOUNTER — Telehealth: Payer: Self-pay | Admitting: Hematology & Oncology

## 2013-08-31 ENCOUNTER — Other Ambulatory Visit: Payer: Self-pay | Admitting: Hematology & Oncology

## 2013-08-31 NOTE — Telephone Encounter (Signed)
Pt aware of 1-15 appointment °

## 2013-09-01 NOTE — Progress Notes (Signed)
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  Past or anticipated interventions, if any, per neurosurgery: 08/13/13  Procedure: RIGHT FRONTAL CRANIOTOMY FOR RESECTION OF TUMOR;  Surgeon: Consuella Lose, MD;  Location: [place] [facility];  Service: Neurosurgery;  Laterality: Right;  right  Past or anticipated interventions, if any, per medical oncology:  Per Dr. Marin Olp, "if she is positive for the methylation of MGMT, then we might make a case for low-dose Temodar with radiation and possibly full dose Temodar post  Radiation."   Possible current pregnancy? no  Is the patient on methotrexate? no

## 2013-09-02 ENCOUNTER — Ambulatory Visit
Admission: RE | Admit: 2013-09-02 | Discharge: 2013-09-02 | Disposition: A | Discharge status: Home / Self Care | Payer: Medicare Other | Source: Ambulatory Visit | Attending: Radiation Oncology | Admitting: Radiation Oncology

## 2013-09-02 ENCOUNTER — Telehealth: Payer: Self-pay | Admitting: *Deleted

## 2013-09-02 ENCOUNTER — Ambulatory Visit: Payer: Medicare Other

## 2013-09-02 NOTE — Telephone Encounter (Signed)
Left msg on vm stating they have received referral for pt to have MRI/ Brain. Needing to make sure does pt still need. Per her chart pt has had several MRI also brain surgery. Pls confirm...[name]

## 2013-09-02 NOTE — Telephone Encounter (Signed)
No MRI initially ordered prior to admission is not needed at this time - My question is WHY this MRI was not scheduled sooner when ordered initially?? -  thanks

## 2013-09-03 ENCOUNTER — Ambulatory Visit: Payer: Medicare Other | Admitting: Radiation Oncology

## 2013-09-03 ENCOUNTER — Encounter: Payer: Self-pay | Admitting: Radiation Oncology

## 2013-09-03 ENCOUNTER — Ambulatory Visit
Admission: RE | Admit: 2013-09-03 | Discharge: 2013-09-03 | Disposition: A | Discharge status: Home / Self Care | Payer: Medicare Other | Source: Ambulatory Visit | Attending: Radiation Oncology | Admitting: Radiation Oncology

## 2013-09-03 ENCOUNTER — Ambulatory Visit: Payer: Medicare Other

## 2013-09-03 VITALS — BP 104/57 | HR 71 | Temp 98.0°F | Ht 64.0 in | Wt 172.5 lb

## 2013-09-03 DIAGNOSIS — C711 Malignant neoplasm of frontal lobe: Secondary | ICD-10-CM | POA: Insufficient documentation

## 2013-09-03 DIAGNOSIS — D496 Neoplasm of unspecified behavior of brain: Secondary | ICD-10-CM

## 2013-09-03 DIAGNOSIS — Z79899 Other long term (current) drug therapy: Secondary | ICD-10-CM | POA: Insufficient documentation

## 2013-09-03 DIAGNOSIS — C719 Malignant neoplasm of brain, unspecified: Secondary | ICD-10-CM

## 2013-09-03 HISTORY — DX: Malignant neoplasm of brain, unspecified: C71.9

## 2013-09-03 NOTE — Telephone Encounter (Signed)
Called [name] no answer LMOM with md response...[name]

## 2013-09-03 NOTE — Progress Notes (Signed)
Radiation Oncology         [phone] ________________________________  Name: [name] MRN: [id number]  Date: 09/03/2013  DOB: 1928-05-11  Reevaluation Note  CC: [name], MD  Volanda Napoleon, MD  Diagnosis:   Glioblastoma multiforme of the right frontal brain  Narrative:  The patient returns today for further evaluation. The patient was seen as an inpatient on 08/17/2013. She currently resides in a rehabilitation facility but is scheduled for discharge January 14.  she is improving with her strength. She can ambulate by herself. She denies any headaches or nausea or scalp irritation. Patient did have her staples removed earlier this week.                             ALLERGIES:  is allergic to iodine.  Meds: Current Outpatient Prescriptions  Medication Sig Dispense Refill  . ALPRAZolam (XANAX) 0.25 MG tablet Take one tablet by mouth three times daily as needed for anxiety  90 tablet  5  . amitriptyline (ELAVIL) 25 MG tablet Take 1 tablet (25 mg total) by mouth at bedtime.  30 tablet  5  . aspirin 81 MG tablet Take 81 mg by mouth daily.        . clopidogrel (PLAVIX) 75 MG tablet Take 1 tablet (75 mg total) by mouth daily with breakfast.  30 tablet  11  . docusate sodium 100 MG CAPS Take 100 mg by mouth 2 (two) times daily.  10 capsule  0  . feeding supplement, ENSURE COMPLETE, (ENSURE COMPLETE) LIQD Take 237 mLs by mouth daily as needed (suboptimal intake).  30 Bottle  0  . fluconazole (DIFLUCAN) 100 MG tablet Take 100 mg by mouth daily. Take for 5 days starting 08/31/13      . fluocinonide-emollient (LIDEX-E) 0.05 % cream Apply 1 application topically 2 (two) times a week.       . indomethacin (INDOCIN) 25 MG capsule Take 25 mg by mouth daily as needed for moderate pain.      Marland Kitchen levETIRAcetam (KEPPRA) 500 MG tablet Take 1 tablet (500 mg total) by mouth 2 (two) times daily.  60 tablet  0  . levothyroxine (SYNTHROID, LEVOTHROID) 100 MCG tablet Take 1 tablet (100 mcg  total) by mouth daily.  30 tablet  11  . losartan (COZAAR) 50 MG tablet Take 1 tablet (50 mg total) by mouth daily.  30 tablet  11  . methocarbamol (ROBAXIN) 500 MG tablet Take 500-1,000 mg by mouth every 8 (eight) hours as needed for muscle spasms.      . pantoprazole (PROTONIX) 40 MG tablet Take 40 mg by mouth 2 (two) times daily.        . polyethylene glycol (MIRALAX / GLYCOLAX) packet Take 17 g by mouth daily as needed.      . pravastatin (PRAVACHOL) 40 MG tablet Take 40 mg by mouth daily.      . promethazine (PHENERGAN) 25 MG tablet Take 25 mg by mouth every 8 (eight) hours as needed for nausea or vomiting.      . traMADol-acetaminophen (ULTRACET) 37.5-325 MG per tablet Take one tablet by mouth every 6 hours as needed for moderate pain  120 tablet  0  . dexamethasone (DECADRON) 4 MG tablet Take 1 tablet (4 mg total) by mouth as directed.  17 tablet  0   No current facility-administered medications for this encounter.    Physical Findings: The patient is in no  acute distress. Patient is alert and oriented.  height is 5\' 4"  (1.626 m) and weight is 172 lb 8 oz (78.245 kg). Her temperature is 98 F (36.7 C). Her blood pressure is 104/57 and her pulse is 71. Her oxygen saturation is 98%. .  The lungs are clear to auscultation. The heart has a regular rhythm and rate. Motor strength is 5 out of 5 in the proximal and distal muscle groups of the upper and lower extremities. The right scalp scar is healing well without signs of drainage or infection. No secondary infection noted in the oral cavity.  Lab Findings: Lab Results  Component Value Date   WBC 12.3* 08/14/2013   HGB 10.6* 08/14/2013   HCT 32.7* 08/14/2013   MCV 95.9 08/14/2013   PLT 217 08/14/2013    Impression:  The patient is now ready to proceed with postoperative radiation treatments. I discussed the treatment course side effects and potential toxicities of radiation therapy in this situation with the patient. She appears to  understand and wishes to proceed with planned course of treatment.  In light of the close proximity of  the target area for treatment near the optic, I would not recommend hypofractionated accelerated treatment in this situation. Patient will receive standard fractionation 2 gray per fraction with 30 treatments.  Plan:  Simulation and planning early next week I anticipate 6 weeks of radiation therapy.  ____________________________________ [name], MD

## 2013-09-03 NOTE — Progress Notes (Signed)
Please see the Nurse Progress Note in the MD Initial Consult Encounter for this patient. 

## 2013-09-03 NOTE — Progress Notes (Signed)
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   Past or anticipated interventions, if any, per neurosurgery: 08/13/13 Procedure: RIGHT FRONTAL CRANIOTOMY FOR RESECTION OF TUMOR; Surgeon: Consuella Lose, MD; Location: [facility]; Service: Neurosurgery; Laterality: Right; right   Past or anticipated interventions, if any, per medical oncology: Per Dr. Marin Olp, "if she is positive for the methylation of MGMT, then we might make a case for low-dose Temodar with radiation and possibly full dose Temodar post  Radiation."   Dose of Decadron, if applicable: was weaned off on 09/01/13  Recent neurologic symptoms, if any:  Seizures: no  Headaches: occasional  Nausea: no   Dizziness/ataxia: yes Difficulty with hand coordination: no Focal numbness/weakness: fingertips and toes Visual deficits/changes: no Confusion/Memory deficits: no    SAFETY ISSUES:  Prior radiation? no Pacemaker/ICD? no  Possible current pregnancy? no  Is the patient on methotrexate? No  Additional Complaints / other details: Currently at [facility].  She will be going home Wednesday of next week.  She will be living with her disabled daughter.

## 2013-09-04 ENCOUNTER — Encounter (HOSPITAL_COMMUNITY): Payer: Self-pay

## 2013-09-07 ENCOUNTER — Ambulatory Visit
Admission: RE | Admit: 2013-09-07 | Discharge: 2013-09-07 | Disposition: A | Discharge status: Home / Self Care | Payer: Medicare Other | Source: Ambulatory Visit | Attending: Radiation Oncology | Admitting: Radiation Oncology

## 2013-09-07 ENCOUNTER — Non-Acute Institutional Stay (SKILLED_NURSING_FACILITY): Payer: Medicare Other | Admitting: Internal Medicine

## 2013-09-07 DIAGNOSIS — R059 Cough, unspecified: Secondary | ICD-10-CM

## 2013-09-07 DIAGNOSIS — M25579 Pain in unspecified ankle and joints of unspecified foot: Secondary | ICD-10-CM | POA: Insufficient documentation

## 2013-09-07 DIAGNOSIS — Z510 Encounter for antineoplastic radiation therapy: Secondary | ICD-10-CM | POA: Insufficient documentation

## 2013-09-07 DIAGNOSIS — Z85850 Personal history of malignant neoplasm of thyroid: Secondary | ICD-10-CM | POA: Insufficient documentation

## 2013-09-07 DIAGNOSIS — C719 Malignant neoplasm of brain, unspecified: Secondary | ICD-10-CM

## 2013-09-07 DIAGNOSIS — Z803 Family history of malignant neoplasm of breast: Secondary | ICD-10-CM | POA: Insufficient documentation

## 2013-09-07 DIAGNOSIS — M810 Age-related osteoporosis without current pathological fracture: Secondary | ICD-10-CM | POA: Insufficient documentation

## 2013-09-07 DIAGNOSIS — R05 Cough: Secondary | ICD-10-CM | POA: Insufficient documentation

## 2013-09-07 DIAGNOSIS — R42 Dizziness and giddiness: Secondary | ICD-10-CM | POA: Insufficient documentation

## 2013-09-07 DIAGNOSIS — Z7982 Long term (current) use of aspirin: Secondary | ICD-10-CM | POA: Insufficient documentation

## 2013-09-07 DIAGNOSIS — F29 Unspecified psychosis not due to a substance or known physiological condition: Secondary | ICD-10-CM | POA: Insufficient documentation

## 2013-09-07 DIAGNOSIS — J3489 Other specified disorders of nose and nasal sinuses: Secondary | ICD-10-CM | POA: Insufficient documentation

## 2013-09-07 DIAGNOSIS — Z8673 Personal history of transient ischemic attack (TIA), and cerebral infarction without residual deficits: Secondary | ICD-10-CM | POA: Insufficient documentation

## 2013-09-07 DIAGNOSIS — W19XXXA Unspecified fall, initial encounter: Secondary | ICD-10-CM | POA: Insufficient documentation

## 2013-09-07 DIAGNOSIS — Z79899 Other long term (current) drug therapy: Secondary | ICD-10-CM | POA: Insufficient documentation

## 2013-09-07 DIAGNOSIS — E0789 Other specified disorders of thyroid: Secondary | ICD-10-CM | POA: Insufficient documentation

## 2013-09-07 DIAGNOSIS — Z90710 Acquired absence of both cervix and uterus: Secondary | ICD-10-CM | POA: Insufficient documentation

## 2013-09-07 DIAGNOSIS — I6350 Cerebral infarction due to unspecified occlusion or stenosis of unspecified cerebral artery: Secondary | ICD-10-CM

## 2013-09-07 DIAGNOSIS — C711 Malignant neoplasm of frontal lobe: Secondary | ICD-10-CM | POA: Insufficient documentation

## 2013-09-07 DIAGNOSIS — I10 Essential (primary) hypertension: Secondary | ICD-10-CM

## 2013-09-07 NOTE — Progress Notes (Signed)
        PROGRESS NOTE  DATE: 09/07/2013   FACILITY: [facility]  LEVEL OF CARE: SNF (31)  Discharge Visit  CHIEF COMPLAINT:  Manage glioblastoma multiforme  HISTORY OF PRESENT ILLNESS: I was requested by the social worker to perform face-to-face evaluation for discharge:  Patient was admitted to this facility for short-term rehabilitation after the patient's recent hospitalization.  Patient has completed SNF rehabilitation and therapy has cleared the patient for discharge on 09/09/13  Reassessment of ongoing problem(s):  Patient was diagnosed the grade 4 glioblastoma multiforme of the brain. She has left-sided weakness and has been undergoing rehabilitation. She is currently on adjuvant chemoradiation. She is tolerating treatment without any problems.  PAST MEDICAL HISTORY : Reviewed.  No changes.  CURRENT MEDICATIONS: Reviewed per [facility]  REVIEW OF SYSTEMS:  GENERAL: no change in appetite, no fatigue, no weight changes, no fever, chills or weakness RESPIRATORY: SOB, DOE, wheezing, hemoptysis, complains of cough CARDIAC: no chest pain, edema or palpitations GI: no abdominal pain, diarrhea, constipation, heart burn, nausea or vomiting  PHYSICAL EXAMINATION  VS:  T 97.6       P 82       RR 20      BP 103/55     POX %       WT (Lb) 168.6  GENERAL: no acute distress, normal body habitus NECK: supple, trachea midline, no neck masses, no thyroid tenderness, no thyromegaly RESPIRATORY: breathing is even & unlabored, BS CTAB CARDIAC: RRR, no murmur,no extra heart sounds, no edema GI: abdomen soft, normal BS, no masses, no tenderness, no hepatomegaly, no splenomegaly PSYCHIATRIC: the patient is alert & oriented to person, affect & behavior appropriate  LABS/RADIOLOGY: 12/14 WBC 16.6 otherwise CBC normal, calcium 10.6, BUN 30, creatinine 1.4 otherwise BMP normal  ASSESSMENT/PLAN:  Glioblastoma multiforme-continue chemoradiation. Cough-new problem. Start  Mucinex 600 mg twice a day for one week and Tamiflu 75 mg daily for 7 days. Check influenza PCR. Hypertension-well-controlled. CVA-continue Plavix Hyperlipidemia-continue pravastatin. Hypothyroidism-continue levothyroxine  I have filled out patient's discharge paperwork and written prescriptions.  Patient will receive home health PT, OT, ST, nursing, social worker and CNA. DME provided: None  Patient needs 24-hour supervision due to poor safety awareness.  Total discharge time: less than 30 minutes Discharge time involved coordination of the discharge process with social worker, nursing staff and therapy department. Medical justification for home health services/DME verified.  CPT CODE: 14481

## 2013-09-08 NOTE — Progress Notes (Signed)
  Radiation Oncology         [phone] ________________________________  Name: GENESIS NOVOSAD MRN: [id number]  Date: 09/07/2013  DOB: [birth year]  SIMULATION AND TREATMENT PLANNING NOTE  DIAGNOSIS:   Glioblastoma multiforme of the right frontal brain   NARRATIVE:  The patient was brought to the [place] suite.  Identity was confirmed.  All relevant records and images related to the planned course of therapy were reviewed.  The patient freely provided informed written consent to proceed with treatment after reviewing the details related to the planned course of therapy. The consent form was witnessed and verified by the simulation staff.  Then, the patient was set-up in a stable reproducible  supine position for radiation therapy.  CT images were obtained.  Surface markings were placed.  The CT images were loaded into the planning software.  Then the target and avoidance structures were contoured.  Treatment planning then occurred.  The radiation prescription was entered and confirmed.  Then, I designed and supervised the construction of a total of 1 medically necessary complex treatment devices.  I have requested : Intensity Modulated Radiotherapy (IMRT) is medically necessary for this case for the following reason:  Critical CNS structure avoidance - brainstem, optic chiasm, optic nerve..  I have ordered:dose calc.  PLAN:  The patient will receive 60 Gy in 30 fractions.  ________________________________  -----------------------------------  [name], PhD, MD

## 2013-09-10 ENCOUNTER — Ambulatory Visit: Payer: Medicare Other | Admitting: Hematology & Oncology

## 2013-09-10 ENCOUNTER — Other Ambulatory Visit: Payer: Medicare Other | Admitting: Lab

## 2013-09-11 ENCOUNTER — Other Ambulatory Visit: Payer: Medicare Other

## 2013-09-11 ENCOUNTER — Other Ambulatory Visit: Payer: Self-pay | Admitting: Internal Medicine

## 2013-09-14 ENCOUNTER — Telehealth: Payer: Self-pay | Admitting: Hematology & Oncology

## 2013-09-14 NOTE — Telephone Encounter (Signed)
Pt was no show for 1-15 she went home from rehab that day. She said they told her it was cx. Pt is aware of 1-23 appointment

## 2013-09-16 ENCOUNTER — Ambulatory Visit: Payer: Medicare Other | Admitting: Radiation Oncology

## 2013-09-16 ENCOUNTER — Ambulatory Visit: Payer: Medicare Other | Admitting: Internal Medicine

## 2013-09-16 DIAGNOSIS — Z0289 Encounter for other administrative examinations: Secondary | ICD-10-CM

## 2013-09-17 ENCOUNTER — Encounter: Payer: Self-pay | Admitting: *Deleted

## 2013-09-17 ENCOUNTER — Ambulatory Visit: Payer: Medicare Other
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
## 2013-09-18 ENCOUNTER — Ambulatory Visit: Payer: Medicare Other

## 2013-09-18 ENCOUNTER — Telehealth: Payer: Self-pay | Admitting: Oncology

## 2013-09-18 ENCOUNTER — Ambulatory Visit: Payer: Medicare Other | Admitting: Hematology & Oncology

## 2013-09-18 ENCOUNTER — Other Ambulatory Visit: Payer: Medicare Other | Admitting: Lab

## 2013-09-18 ENCOUNTER — Encounter: Payer: Self-pay | Admitting: *Deleted
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
## 2013-09-18 NOTE — Progress Notes (Signed)
[place] Work  Clinical Social Work was finally able to speak with an actual person at [facility] after leaving several messages. CSW spoke with APS Intake Worker Jearld Lesch, who took the report. CSW asked to be notified of the outcome. CSW also updated our radiation team that we needed to ensure Pt's safety then work on transportation to appointments.   Clinical Social Work interventions: APS report  [name], LCSW Clinical Social Worker [facility] for [place] Wednesday, Thursday and Friday Phone: [phone] Fax: [phone]

## 2013-09-18 NOTE — Telephone Encounter (Signed)
Called [name] to ask if she was coming to her treatment appointment today.  [name] said she was trying to find a ride.  She was wondering what the phone number was for [facility].  Gave her the number.  [name] said she would call back to let us know if she has a ride.

## 2013-09-21 ENCOUNTER — Ambulatory Visit: Admission: RE | Admit: 2013-09-21 | Payer: Medicare Other | Source: Ambulatory Visit

## 2013-09-21 ENCOUNTER — Telehealth: Payer: Self-pay | Admitting: Oncology

## 2013-09-21 NOTE — Telephone Encounter (Signed)
Called [place] and talked to her daughter Mickel Baas.  Per Mickel Baas, she will be able to bring [facility] for radiation treatment on Wednesday.  Advised that the appointment time is at 3:35 pm.  Notified Faith on Linac 4.

## 2013-09-22 ENCOUNTER — Ambulatory Visit: Payer: Medicare Other

## 2013-09-23 ENCOUNTER — Encounter: Payer: Medicare Other | Admitting: Internal Medicine

## 2013-09-23 ENCOUNTER — Ambulatory Visit: Payer: Medicare Other

## 2013-09-24 ENCOUNTER — Encounter: Payer: Self-pay | Admitting: *Deleted

## 2013-09-24 ENCOUNTER — Telehealth: Payer: Self-pay | Admitting: Oncology

## 2013-09-24 ENCOUNTER — Ambulatory Visit: Payer: Medicare Other

## 2013-09-24 NOTE — Progress Notes (Signed)
[place] Work  Clinical Social Work continues to follow closely for many social concerns that resulted in an APS report last week. CSW has spoken to Kirsten Kinds, RN from [facility]. They report Pt refused services when they first went out when she got home from SNF. Pt then agreed to [name] services, but Pt and family have not answered the door when [facility] RN has gone out to date. [name] will attempt one last time this coming Saturday at 1pm. They cannot continue to try to offer services without success and this will be their last attempt. Daughter, [name] is aware of visit and sounded agreeable.   CSW spoke with Pt's daughter, [name] who reports to still not have transportation to bring Pt to appts. She stated she plans to get the car fixed today. CSW stressed the need for Pt to come daily once she begins treatment. Daughter was not aware of how many appointments Pt would have and felt that it would be impossible to bring Pt daily, but would try. CSW explained possible assistance with gas once car is repaired. [name] went on to share concerns that Pt "may not be able to handle all this, as she is sickly." Daughter expressed concerns that her mother still may have a poor outcome. CSW suggested that daughter contact Pt's providers to discuss further. CSW also stressed to daughter the need to allow [facility] in the home and educated her on how they could help. Daughter feels Pt may need a hospital bed currently. Daughter agrees to call [facility] and reschedule appointments and ask questions/share concerns to the medical team.   CSW left vm for APS CSW, [name] at [phone] with the additional concerns and need to return CSW call. CSW will continue to try to assist Pt and family in order to make sure Pt is safe and getting access to medical care. If Pt and family decides not to pursue treatment, hospice services would be an additional layer of support to assist them. CSW awaits APS return call and  will continue to follow.   Clinical Social Work interventions: Pt and family education Resource and referral coordination Case management   [name], LCSW Clinical Social Worker [name] for [place] Wednesday, Thursday and Friday Phone: [phone] Fax: [phone]

## 2013-09-24 NOTE — Telephone Encounter (Signed)
Called to see if [name] is coming to radiation today.  Talked to her daughter Mickel Baas who said they will not be able to come until Monday due to not having transportation.  Mickel Baas said they should have a car on Monday.  Will forward to Dr. Sondra Come and notified Miranda on Linac 4.

## 2013-09-25 ENCOUNTER — Ambulatory Visit: Payer: Medicare Other

## 2013-09-28 ENCOUNTER — Ambulatory Visit: Payer: Medicare Other

## 2013-09-29 ENCOUNTER — Ambulatory Visit: Payer: Medicare Other | Admitting: Radiation Oncology

## 2013-09-29 ENCOUNTER — Ambulatory Visit: Payer: Medicare Other

## 2013-09-30 ENCOUNTER — Ambulatory Visit: Payer: Medicare Other

## 2013-09-30 ENCOUNTER — Encounter: Payer: Self-pay | Admitting: *Deleted

## 2013-09-30 ENCOUNTER — Telehealth: Payer: Self-pay | Admitting: Oncology
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
## 2013-10-01 ENCOUNTER — Ambulatory Visit: Payer: Medicare Other

## 2013-10-01 ENCOUNTER — Encounter: Payer: Self-pay | Admitting: *Deleted
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
## 2013-10-02 ENCOUNTER — Encounter: Payer: Self-pay | Admitting: *Deleted

## 2013-10-02 ENCOUNTER — Ambulatory Visit: Payer: Medicare Other

## 2013-10-02 NOTE — Progress Notes (Signed)
[place] Work  Clinical Social Work following due to concerns shared with APS. They confirmed with Pt that she will agree to come to appointment and they will bring her on 10/05/13 for follow up. This CSW left message for Elmo Putt as well. CSW provided APS with needed documents as well. CSW, Johnnye Lana will attempt to be present at appointment to assist with problem solving and advocating for Pt.   Clinical Social Work interventions: Pt advocate for medical care.   [name], LCSW Clinical Social Worker [facility] [place] for [place] Wednesday, Thursday and Friday Phone: [phone] Fax: [phone]

## 2013-10-05 ENCOUNTER — Ambulatory Visit
Admission: RE | Admit: 2013-10-05 | Discharge: 2013-10-05 | Disposition: A | Discharge status: Home / Self Care | Payer: Medicare Other | Source: Ambulatory Visit | Attending: Radiation Oncology | Admitting: Radiation Oncology

## 2013-10-05 ENCOUNTER — Encounter: Payer: Self-pay | Admitting: Radiation Oncology

## 2013-10-05 ENCOUNTER — Ambulatory Visit: Payer: Medicare Other

## 2013-10-05 VITALS — BP 143/68 | HR 72 | Temp 97.9°F | Ht 64.0 in | Wt 171.6 lb

## 2013-10-05 DIAGNOSIS — C719 Malignant neoplasm of brain, unspecified: Secondary | ICD-10-CM

## 2013-10-05 DIAGNOSIS — Z79899 Other long term (current) drug therapy: Secondary | ICD-10-CM | POA: Insufficient documentation

## 2013-10-05 DIAGNOSIS — Z7982 Long term (current) use of aspirin: Secondary | ICD-10-CM | POA: Insufficient documentation

## 2013-10-05 DIAGNOSIS — C711 Malignant neoplasm of frontal lobe: Secondary | ICD-10-CM | POA: Insufficient documentation

## 2013-10-05 NOTE — Progress Notes (Signed)
Radiation Oncology         [phone] ________________________________  Name: [name] MRN: [id number]  Date: 10/05/2013  DOB: 1928-03-12  Follow-Up Visit Note  CC: [name], MD  Rowe Clack, MD  Diagnosis:   Glioblastoma multiforme of the right frontal brain  Narrative:  The patient returns today prior to starting her radiation therapy. Patient underwent simulation on January 12.  Due to  social issues and transportation difficulties she has not started her radiation therapy.  Today the patient was transported by social work for discussion of postoperative radiation therapy..  I discussed the treatment course side effects and potential toxicities of radiation therapy in this situation again with the patient. She appears to understand and at this time does wish to proceed with treatment.  The major issue however is transporting her daily for treatment. Social work is working hard on this issue but no immediate solutions.  Patient denies any blurred vision or headaches. She continues to be on low-dose Decadron                          ALLERGIES:  is allergic to iodine.  Meds: Current Outpatient Prescriptions  Medication Sig Dispense Refill  . ALPRAZolam (XANAX) 0.25 MG tablet Take one tablet by mouth three times daily as needed for anxiety  90 tablet  5  . amitriptyline (ELAVIL) 25 MG tablet Take 1 tablet (25 mg total) by mouth at bedtime.  30 tablet  5  . aspirin 81 MG tablet Take 81 mg by mouth daily.        . clopidogrel (PLAVIX) 75 MG tablet Take 1 tablet (75 mg total) by mouth daily with breakfast.  30 tablet  11  . dexamethasone (DECADRON) 4 MG tablet Take 1 tablet (4 mg total) by mouth as directed.  17 tablet  0  . docusate sodium 100 MG CAPS Take 100 mg by mouth 2 (two) times daily.  10 capsule  0  . fluocinonide-emollient (LIDEX-E) 0.05 % cream Apply 1 application topically 2 (two) times a week.       . indomethacin (INDOCIN) 25 MG capsule Take 25 mg  by mouth daily as needed for moderate pain.      . indomethacin (INDOCIN) 25 MG capsule TAKE ONE CAPSULE BY MOUTH EVERY DAY AS NEEDED  60 capsule  1  . levETIRAcetam (KEPPRA) 500 MG tablet Take 1 tablet (500 mg total) by mouth 2 (two) times daily.  60 tablet  0  . levothyroxine (SYNTHROID, LEVOTHROID) 100 MCG tablet Take 1 tablet (100 mcg total) by mouth daily.  30 tablet  11  . losartan (COZAAR) 50 MG tablet Take 1 tablet (50 mg total) by mouth daily.  30 tablet  11  . methocarbamol (ROBAXIN) 500 MG tablet Take 500-1,000 mg by mouth every 8 (eight) hours as needed for muscle spasms.      . pantoprazole (PROTONIX) 40 MG tablet Take 40 mg by mouth 2 (two) times daily.        . pravastatin (PRAVACHOL) 40 MG tablet Take 40 mg by mouth daily.      . promethazine (PHENERGAN) 25 MG tablet Take 25 mg by mouth every 8 (eight) hours as needed for nausea or vomiting.      . traMADol-acetaminophen (ULTRACET) 37.5-325 MG per tablet Take one tablet by mouth every 6 hours as needed for moderate pain  120 tablet  0  . feeding supplement, ENSURE  COMPLETE, (ENSURE COMPLETE) LIQD Take 237 mLs by mouth daily as needed (suboptimal intake).  30 Bottle  0  . fluconazole (DIFLUCAN) 100 MG tablet Take 100 mg by mouth daily. Take for 5 days starting 08/31/13      . polyethylene glycol (MIRALAX / GLYCOLAX) packet Take 17 g by mouth daily as needed.       No current facility-administered medications for this encounter.    Physical Findings: The patient is in no acute distress. Patient is alert and oriented.  height is 5\' 4"  (1.626 m) and weight is 171 lb 9.6 oz (77.837 kg). Her temperature is 97.9 F (36.6 C). Her blood pressure is 143/68 and her pulse is 72. Her oxygen saturation is 99%. .  The pupils are equal round and reactive to light. The extraocular eye movements are intact. The tongue is midline. There is no secondary infection noted in the oral cavity. Motor strength is 5 out of 5 in the proximal and distal muscle  groups of the upper and lower extremities. The patient ambulated into the clinic today.  Lab Findings: Lab Results  Component Value Date   WBC 12.3* 08/14/2013   HGB 10.6* 08/14/2013   HCT 32.7* 08/14/2013   MCV 95.9 08/14/2013   PLT 217 08/14/2013      Radiographic Findings: No results found.  Impression: Glioblastoma multiforme in the right frontal brain.  Patient still has not started her postoperative radiation treatments due to transportation/social issues.  She does wish to proceed with this treatment. The patient is not a candidate for placement in an assisted living center and is not a candidate for nursing home placement.  Plan:  She will proceed with radiation therapy as soon as transportation issues are addressed.  ____________________________________ [name], MD

## 2013-10-05 NOTE — Progress Notes (Addendum)
[name] here with her Education officer, museum.  She reports that she is having pain in her lower back that she is rating at a 5/10.  She reports dizziness and that she fell last Thursday.  She reports taking decadron 4 mg once a day at bedtime.  She denies vision problems.  She says she is concerned about radiation because she does not want to be sick.  She wants to discuss her prognosis.  She is also concerned about coming for treatment everyday.

## 2013-10-06 ENCOUNTER — Encounter: Payer: Self-pay | Admitting: *Deleted

## 2013-10-06 ENCOUNTER — Ambulatory Visit: Payer: Medicare Other

## 2013-10-06 NOTE — Progress Notes (Signed)
[place] Work  Clinical Social Work met with pt in exam room at [facility] to offer support and assess for psychosocial needs.  Pt is currently living at home with her daughter, who offers limited support.  While pt does have multiple psychosocial issues, transportation has presented as the primary barrier to treatment.  CSW and pt discussed the information she received from the the physician and her wishes to proceed with treatment.  CSW and pt also discussed transportation, and pt plans to "talk to church members and neighbors".  Pt stated she may be able to pay out of pocket for transportation "depending on how much it cost"..  CSW will continue looking into private transportation resources for pt.  CSW informed Arville Go, Adult IT consultant on the outcome of pt's appointment.  Arville Go will also continue looking into resources for pt as well as a plan to meet pt's needs at home.  Arville Go has been in contact with [facility] and is hopefull to arrange transportation through the volunteer program.  If [facility] is able to provide transportation the goal will be to start treatment next week.  CSW will continue to follow and support.  CSW encouraged pt to call with any questions or concerns.     Johnnye Lana, MSW, [place] Worker [facility] [phone]

## 2013-10-07 ENCOUNTER — Ambulatory Visit: Payer: Self-pay | Admitting: Radiation Oncology

## 2013-10-07 ENCOUNTER — Encounter: Payer: Self-pay | Admitting: Internal Medicine

## 2013-10-07 ENCOUNTER — Ambulatory Visit: Payer: Medicare Other

## 2013-10-07 NOTE — Telephone Encounter (Signed)
error 

## 2013-10-08 ENCOUNTER — Ambulatory Visit: Payer: Medicare Other

## 2013-10-09 ENCOUNTER — Encounter: Payer: Self-pay | Admitting: *Deleted

## 2013-10-09 ENCOUNTER — Telehealth: Payer: Self-pay | Admitting: Oncology

## 2013-10-09 ENCOUNTER — Ambulatory Visit: Payer: Medicare Other

## 2013-10-09 NOTE — Telephone Encounter (Signed)
Received call from [place], [place].  [name] has transportation arranged by her [facility].  She will be coming treatment at 2:15 on Monday.  Notified Jennifer on [employer].

## 2013-10-09 NOTE — Progress Notes (Signed)
[place] Clinical Social Work  Clinical Social Work phoned Press photographer at [facility] to confirm pt has transportation for her appointments next week. She reports that yes, transportation has been arranged for the next three weeks at 2:15pm. CSW updated Elmo Putt, RN. CSW will follow and is hopeful that this will be successful.   Clinical Social Work interventions: Resource coordination.   [name], LCSW Clinical Social Worker [facility] [place] for [place] Wednesday, Thursday and Friday Phone: [phone] Fax: [phone]

## 2013-10-12 ENCOUNTER — Ambulatory Visit: Payer: Medicare Other

## 2013-10-13 ENCOUNTER — Telehealth: Payer: Self-pay | Admitting: Oncology

## 2013-10-13 ENCOUNTER — Ambulatory Visit: Payer: Medicare Other

## 2013-10-13 NOTE — Addendum Note (Signed)
Encounter addended by: Jacqulyn Liner, RN on: 10/13/2013  9:20 AM<BR>     Documentation filed: Charges VN

## 2013-10-13 NOTE — Telephone Encounter (Signed)
Called and talked to [facility] daughter, Mickel Baas, to see if they had a ride for today.  Per Diamantina [place] is going to start tomorrow.  She said they have a ride for tomorrow, Thursday and Friday set up.  Verified that they are coming for 2:15 tomorrow. Mickel Baas verbalized agreement.  Notified Faith, RT on tomo.

## 2013-10-14 ENCOUNTER — Ambulatory Visit: Admission: RE | Admit: 2013-10-14 | Payer: Medicare Other | Source: Ambulatory Visit

## 2013-10-14 ENCOUNTER — Ambulatory Visit: Payer: Medicare Other

## 2013-10-15 ENCOUNTER — Ambulatory Visit: Payer: Medicare Other

## 2013-10-15 ENCOUNTER — Ambulatory Visit
Admission: RE | Admit: 2013-10-15 | Discharge: 2013-10-15 | Disposition: A | Discharge status: Home / Self Care | Payer: Medicare Other | Source: Ambulatory Visit | Attending: Radiation Oncology | Admitting: Radiation Oncology

## 2013-10-16 ENCOUNTER — Encounter: Payer: Self-pay | Admitting: *Deleted

## 2013-10-16 ENCOUNTER — Ambulatory Visit: Payer: Medicare Other

## 2013-10-16 ENCOUNTER — Ambulatory Visit
Admission: RE | Admit: 2013-10-16 | Discharge: 2013-10-16 | Disposition: A | Discharge status: Home / Self Care | Payer: Medicare Other | Source: Ambulatory Visit | Attending: Radiation Oncology | Admitting: Radiation Oncology

## 2013-10-16 NOTE — Progress Notes (Signed)
[place] Work  Clinical Social Work confirmed that Pt has been getting to her treatments the last two days and will continue to follow closely in the weeks ahead.    [name], LCSW Clinical Social Worker [facility] [place] for [place] Wednesday, Thursday and Friday Phone: [phone] Fax: [phone]

## 2013-10-19 ENCOUNTER — Ambulatory Visit: Payer: Medicare Other

## 2013-10-19 ENCOUNTER — Telehealth: Payer: Self-pay | Admitting: Oncology

## 2013-10-19 NOTE — Telephone Encounter (Signed)
Called and left a message for Susy to call back regarding her missed appointment today.

## 2013-10-20 ENCOUNTER — Ambulatory Visit: Payer: Medicare Other

## 2013-10-20 ENCOUNTER — Telehealth: Payer: Self-pay | Admitting: Oncology

## 2013-10-20 ENCOUNTER — Ambulatory Visit: Payer: Medicare Other | Admitting: Radiation Oncology

## 2013-10-20 NOTE — Telephone Encounter (Signed)
Called [place] because she missed radiation today.  She said that Joann (with[facility]) was busy yesterday and today and was not able to bring her.  Twylah said she is waiting for her daughter to come home to take her to pick up her car.  Advised her that I would check with Social Work about her ride for tomorrow.  Called [place], CSW and let her know what [place] said.  She is going to call Joann.

## 2013-10-21 ENCOUNTER — Ambulatory Visit: Payer: Medicare Other | Admitting: Radiation Oncology

## 2013-10-21 ENCOUNTER — Ambulatory Visit
Admission: RE | Admit: 2013-10-21 | Discharge: 2013-10-21 | Disposition: A | Discharge status: Home / Self Care | Payer: Medicare Other | Source: Ambulatory Visit | Attending: Radiation Oncology | Admitting: Radiation Oncology

## 2013-10-21 DIAGNOSIS — D496 Neoplasm of unspecified behavior of brain: Secondary | ICD-10-CM

## 2013-10-21 MED ORDER — BIAFINE EX EMUL
Freq: Two times a day (BID) | CUTANEOUS | Status: DC
  Administered 2013-10-21: 15:00:00 via TOPICAL

## 2013-10-21 NOTE — Progress Notes (Signed)
[name] here for post sim education.  Gave her the Radiation Therapy and You book and discussed potential side effects/management of fatigue, hair loss and skin changes.  She was given biafine cream and was instructed to apply it twice a day after treatment and at bedtime.  She was advised to call nursing with any questions or concerns.

## 2013-10-22 ENCOUNTER — Ambulatory Visit: Payer: Medicare Other

## 2013-10-22 ENCOUNTER — Telehealth: Payer: Self-pay | Admitting: Oncology

## 2013-10-22 NOTE — Telephone Encounter (Signed)
Called and left a message for [name] to see if she is coming today for radiation.

## 2013-10-23 ENCOUNTER — Ambulatory Visit
Admission: RE | Admit: 2013-10-23 | Discharge: 2013-10-23 | Disposition: A | Discharge status: Home / Self Care | Payer: Medicare Other | Source: Ambulatory Visit | Attending: Radiation Oncology | Admitting: Radiation Oncology

## 2013-10-26 ENCOUNTER — Ambulatory Visit
Admission: RE | Admit: 2013-10-26 | Discharge: 2013-10-26 | Disposition: A | Discharge status: Home / Self Care | Payer: Medicare Other | Source: Ambulatory Visit | Attending: Radiation Oncology | Admitting: Radiation Oncology

## 2013-10-26 VITALS — BP 114/66 | HR 82 | Temp 98.5°F | Ht 64.0 in | Wt 172.2 lb

## 2013-10-26 DIAGNOSIS — C719 Malignant neoplasm of brain, unspecified: Secondary | ICD-10-CM

## 2013-10-26 NOTE — Progress Notes (Addendum)
[name] has had 5 fractions to her right font brain.  She denies pain, dizziness, balance issues, vision changes and [place].  She reports fatigue.   She is unsure of many of her medications.  She says that she is not taking decadron now. She also reports that she is urinating small amounts at a time.  She says it has been going on for a couple of months.  The skin on her scalp is pink.  She has not started using biafine yet.  She also reports a dry cough.

## 2013-10-26 NOTE — Progress Notes (Signed)
[place]     Rexene Edison, M.D. [place], [place] [address]               [name], M.D., Ph.D. Phone: [phone]      [place] A. Tammi Klippel, M.D. Fax: [phone]      Jodelle Gross, M.D., Ph.D.         Thea Silversmith, M.D.         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  Current Dose: 10 Gy  Current Fraction: 5  Planned Dose: 60 Gy  Narrative: [name] was seen today for weekly treatment management. The chart was checked and MVCT  were reviewed.  She is tolerating her treatments well at this time. She denies any headaches or nausea. She denies any visual problems or focal motor weakness.  [place]ine Current Outpatient Prescriptions  Medication Sig Dispense Refill  . amitriptyline (ELAVIL) 25 MG tablet Take 1 tablet (25 mg total) by mouth at bedtime.  30 tablet  5  . aspirin 81 MG tablet Take 81 mg by mouth daily.        . clopidogrel (PLAVIX) 75 MG tablet Take 1 tablet (75 mg total) by mouth daily with breakfast.  30 tablet  11  . indomethacin (INDOCIN) 25 MG capsule Take 25 mg by mouth daily as needed for moderate pain.      . indomethacin (INDOCIN) 25 MG capsule TAKE ONE CAPSULE BY MOUTH EVERY DAY AS NEEDED  60 capsule  1  . levETIRAcetam (KEPPRA) 500 MG tablet Take 1 tablet (500 mg total) by mouth 2 (two) times daily.  60 tablet  0  . levothyroxine (SYNTHROID, LEVOTHROID) 100 MCG tablet Take 1 tablet (100 mcg total) by mouth daily.  30 tablet  11  . losartan (COZAAR) 50 MG tablet Take 1 tablet (50 mg total) by mouth daily.  30 tablet  11  . methocarbamol (ROBAXIN) 500 MG tablet Take 500-1,000 mg by mouth every 8 (eight) hours as needed for muscle  spasms.      . pravastatin (PRAVACHOL) 40 MG tablet Take 40 mg by mouth daily.      Marland Kitchen ALPRAZolam (XANAX) 0.25 MG tablet Take one tablet by mouth three times daily as needed for anxiety  90 tablet  5  . dexamethasone (DECADRON) 4 MG tablet Take 1 tablet (4 mg total) by mouth as directed.  17 tablet  0  . docusate sodium 100 MG CAPS Take 100 mg by mouth 2 (two) times daily.  10 capsule  0  . emollient (BIAFINE) cream Apply topically 2 (two) times daily.      . feeding supplement, ENSURE COMPLETE, (ENSURE COMPLETE) LIQD Take 237 mLs by mouth daily as needed (suboptimal intake).  30 Bottle  0  . fluconazole (DIFLUCAN) 100 MG tablet Take 100 mg by mouth daily. Take for 5 days starting 08/31/13      . fluocinonide-emollient (LIDEX-E) 0.05 % cream Apply 1 application topically 2 (two) times a week.       . pantoprazole (PROTONIX) 40 MG tablet Take 40 mg by mouth 2 (  two) times daily.        . polyethylene glycol (MIRALAX / GLYCOLAX) packet Take 17 g by mouth daily as needed.      . promethazine (PHENERGAN) 25 MG tablet Take 25 mg by mouth every 8 (eight) hours as needed for nausea or vomiting.      . traMADol-acetaminophen (ULTRACET) 37.5-325 MG per tablet Take one tablet by mouth every 6 hours as needed for moderate pain  120 tablet  0   No current facility-administered medications for this encounter.    Physical Examination:  Filed Vitals:   10/26/13 1448  BP: 114/66  Pulse: 82  Temp: 98.5 F (36.9 C)    Wt Readings from Last 3 Encounters:  10/26/13 172 lb 3.2 oz (78.109 kg)  10/05/13 171 lb 9.6 oz (77.837 kg)  09/03/13 172 lb 8 oz (78.245 kg)    The oral cavity is moist without secondary infection Lungs - Normal respiratory effort, chest expands symmetrically. Lungs are clear to auscultation, no crackles or wheezes.  Heart has regular rhythm and rate  Abdomen is soft and non tender with normal bowel sounds The neurological examination is nonfocal. Patient is alert and oriented and  responds appropriately to questions. Quite pleasant today.  Assessment:  Patient tolerating treatments well  Plan: Continue treatment per original radiation prescription

## 2013-10-27 ENCOUNTER — Ambulatory Visit: Payer: Medicare Other

## 2013-10-28 ENCOUNTER — Ambulatory Visit: Payer: Medicare Other

## 2013-10-28 ENCOUNTER — Ambulatory Visit
Admission: RE | Admit: 2013-10-28 | Discharge: 2013-10-28 | Disposition: A | Discharge status: Home / Self Care | Payer: Medicare Other | Source: Ambulatory Visit | Attending: Radiation Oncology | Admitting: Radiation Oncology

## 2013-10-29 ENCOUNTER — Ambulatory Visit
Admission: RE | Admit: 2013-10-29 | Discharge: 2013-10-29 | Disposition: A | Discharge status: Home / Self Care | Payer: Medicare Other | Source: Ambulatory Visit | Attending: Radiation Oncology | Admitting: Radiation Oncology

## 2013-10-29 ENCOUNTER — Ambulatory Visit: Payer: Medicare Other

## 2013-10-29 ENCOUNTER — Encounter: Payer: Self-pay | Admitting: *Deleted

## 2013-10-29 VITALS — BP 140/81 | HR 79 | Temp 98.4°F

## 2013-10-29 DIAGNOSIS — C719 Malignant neoplasm of brain, unspecified: Secondary | ICD-10-CM

## 2013-10-29 NOTE — Progress Notes (Signed)
[place] Work  Clinical Social Work reviewed chart to make sure pt was still getting to her appointments currently. She appears to be getting to the [facility] on a regular basis this week. CSW will continue to follow and check in for additional needs.     Clinical Social Work interventions: Follow up  [name], [place] Worker Josephina Shih. [place] [facility] [place] Wednesday, Thursday and Friday Phone: [phone] Fax: [phone]
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
## 2013-10-29 NOTE — Addendum Note (Signed)
Encounter addended by: Jacqulyn Liner, RN on: 10/29/2013  7:39 PM<BR>     Documentation filed: Notes Section
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
## 2013-10-30 ENCOUNTER — Ambulatory Visit
Admission: RE | Admit: 2013-10-30 | Discharge: 2013-10-30 | Disposition: A | Discharge status: Home / Self Care | Payer: Medicare Other | Source: Ambulatory Visit | Attending: Radiation Oncology | Admitting: Radiation Oncology

## 2013-10-30 ENCOUNTER — Encounter: Payer: Self-pay | Admitting: *Deleted

## 2013-10-30 ENCOUNTER — Telehealth: Payer: Self-pay | Admitting: Oncology

## 2013-10-30 NOTE — Telephone Encounter (Signed)
Called in refill per Dr. Sondra Come for levETIRAcetam (KEPPRA) 500 MG tablet 60 tablet: Take 1 tablet (500 mg total) by mouth 2 (two) times daily. - Oral.  Disp 60 tablets. 0 refills.  Called in to [facility].  Per patient, it will be easier to pick up at [facility].  Patient was getting prescription at [place].

## 2013-10-30 NOTE — Progress Notes (Signed)
[facility] Clinical Social Work  Clinical Social Work met with pt after her treatment. She was able to get a ride today. CSW spoke with her at length and attempted to problem solve about phone disconnection and transportation. Pt received a ride with her neighbor today. She appears to understand the importance of coming to treatment. CSW attempted to assist pt with calling the phone company about disconnected phone, but Pt could not remember who her phone was through. CSW educated pt about other resources that could assist. She will consider those. APS returned CSW call and they will check on pt next week and continue to follow as well.   [place] , LCSW Clinical Social Worker [facility] Wednesday, Thursday and Friday Phone: [phone] Fax: [phone]   

## 2013-10-30 NOTE — Progress Notes (Signed)
[name]  Clinical Social Work was referred by Pension scheme manager for assessment of psychosocial needs due to ongoing concerns related to transportation.  Clinical Social Worker made aware that pt cancelled her arranged transportation on her own. She then decided to drive herself to the [facility] quite late last night and arrived at [place] made aware of additional concerns regarding utilities and unfilled prescriptions as well. CSW notified pt's APS worker, Ulice Bold at [phone] about these documented concerns. CSW also faxed documentation to APS as requested, release onfile in pt's chart from APS. Pt does not have a ride today and cannot make her treatment as a result. APS cannot transport Pt today as well. CSW not able to contact pt due to disconnected phone. Per APS, they have been having conversations about other living arrangements, like ALF or SNF that can help manage her care. Per APS, pt has not been agreeable to date. CSW explained Pt would most likely not qualify at this point due to being so far out from her hospital d/c. CSW will continue to follow closely. This situation has many barriers, but limited resources to address.     Clinical Social Work interventions: Retail banker   Kirsten Racer, LCSW Clinical Social Worker [facility] [place] [facility] [place] Wednesday, Thursday and Friday Phone: [phone] Fax: [phone]

## 2013-11-02 ENCOUNTER — Ambulatory Visit
Admission: RE | Admit: 2013-11-02 | Discharge: 2013-11-02 | Disposition: A | Discharge status: Home / Self Care | Payer: Medicare Other | Source: Ambulatory Visit | Attending: Radiation Oncology | Admitting: Radiation Oncology

## 2013-11-03 ENCOUNTER — Ambulatory Visit: Payer: Medicare Other

## 2013-11-03 ENCOUNTER — Ambulatory Visit: Payer: Medicare Other | Admitting: Radiation Oncology

## 2013-11-04 ENCOUNTER — Ambulatory Visit
Admission: RE | Admit: 2013-11-04 | Discharge: 2013-11-04 | Disposition: A | Discharge status: Home / Self Care | Payer: Medicare Other | Source: Ambulatory Visit | Attending: Radiation Oncology | Admitting: Radiation Oncology

## 2013-11-04 VITALS — BP 157/59 | HR 55 | Temp 98.4°F

## 2013-11-04 DIAGNOSIS — C719 Malignant neoplasm of brain, unspecified: Secondary | ICD-10-CM
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
## 2013-11-05 ENCOUNTER — Ambulatory Visit
Admission: RE | Admit: 2013-11-05 | Discharge: 2013-11-05 | Disposition: A | Discharge status: Home / Self Care | Payer: Medicare Other | Source: Ambulatory Visit | Attending: Radiation Oncology | Admitting: Radiation Oncology

## 2013-11-06 ENCOUNTER — Ambulatory Visit: Payer: Medicare Other

## 2013-11-06 ENCOUNTER — Encounter: Payer: Self-pay | Admitting: *Deleted

## 2013-11-06 ENCOUNTER — Ambulatory Visit
Admission: RE | Admit: 2013-11-06 | Discharge: 2013-11-06 | Disposition: A | Discharge status: Home / Self Care | Payer: Medicare Other | Source: Ambulatory Visit | Attending: Radiation Oncology | Admitting: Radiation Oncology
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
## 2013-11-09 ENCOUNTER — Ambulatory Visit
Admission: RE | Admit: 2013-11-09 | Discharge: 2013-11-09 | Disposition: A | Discharge status: Home / Self Care | Payer: Medicare Other | Source: Ambulatory Visit | Attending: Radiation Oncology | Admitting: Radiation Oncology

## 2013-11-09 VITALS — BP 147/69 | HR 64 | Temp 98.1°F

## 2013-11-09 DIAGNOSIS — C719 Malignant neoplasm of brain, unspecified: Secondary | ICD-10-CM

## 2013-11-09 NOTE — Progress Notes (Signed)
[place]     Rexene Edison, M.D. [place], [place] [address]               [name], M.D., Ph.D. Phone: [phone]      [place] A. Tammi Klippel, M.D. Fax: [phone]      Jodelle Gross, M.D., Ph.D.         Thea Silversmith, M.D.         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  Current Dose: 26 Gy  Current Fraction: 13  Planned Dose: 60 Gy  Narrative: [name] was seen today for weekly treatment management. The chart was checked and MVCT  were reviewed. The patient is seen today for complaints of dizziness. This began over the weekend. She denies any actual headaches or visual problems.  She is having less coughing.  Iodine  Current Outpatient Prescriptions  Medication Sig Dispense Refill  . amitriptyline (ELAVIL) 25 MG tablet Take 1 tablet (25 mg total) by mouth at bedtime.  30 tablet  5  . aspirin 81 MG tablet Take 81 mg by mouth daily.        . clopidogrel (PLAVIX) 75 MG tablet Take 1 tablet (75 mg total) by mouth daily with breakfast.  30 tablet  11  . dexamethasone (DECADRON) 4 MG tablet Take 1 tablet (4 mg total) by mouth as directed.  17 tablet  0  . emollient (BIAFINE) cream Apply topically 2 (two) times daily.      . indomethacin (INDOCIN) 25 MG capsule Take 25 mg by mouth daily as needed for moderate pain.      . indomethacin (INDOCIN) 25 MG capsule TAKE ONE CAPSULE BY MOUTH EVERY DAY AS NEEDED  60 capsule  1  . levETIRAcetam (KEPPRA) 500 MG tablet Take 500 mg by mouth 2 (two) times daily.      Marland Kitchen levothyroxine (SYNTHROID, LEVOTHROID) 100 MCG tablet Take 1 tablet (100 mcg total) by mouth daily.  30 tablet  11  . losartan (COZAAR) 50 MG  tablet Take 1 tablet (50 mg total) by mouth daily.  30 tablet  11  . methocarbamol (ROBAXIN) 500 MG tablet Take 500-1,000 mg by mouth every 8 (eight) hours as needed for muscle spasms.      . pravastatin (PRAVACHOL) 40 MG tablet Take 40 mg by mouth daily.      Marland Kitchen ALPRAZolam (XANAX) 0.25 MG tablet Take one tablet by mouth three times daily as needed for anxiety  90 tablet  5  . docusate sodium 100 MG CAPS Take 100 mg by mouth 2 (two) times daily.  10 capsule  0  . feeding supplement, ENSURE COMPLETE, (ENSURE COMPLETE) LIQD Take 237 mLs by mouth daily as needed (suboptimal intake).  30 Bottle  0  . fluconazole (DIFLUCAN) 100 MG tablet Take 100 mg by mouth daily. Take for 5 days starting 08/31/13      . fluocinonide-emollient (LIDEX-E) 0.05 % cream Apply 1 application topically 2 (two) times a week.       . pantoprazole (PROTONIX) 40 MG tablet Take 40 mg by mouth 2 (  two) times daily.        . polyethylene glycol (MIRALAX / GLYCOLAX) packet Take 17 g by mouth daily as needed.      . traMADol-acetaminophen (ULTRACET) 37.5-325 MG per tablet Take one tablet by mouth every 6 hours as needed for moderate pain  120 tablet  0   No current facility-administered medications for this encounter.   Labs:  Lab Results  Component Value Date   WBC 12.3* 08/14/2013   HGB 10.6* 08/14/2013   HCT 32.7* 08/14/2013   MCV 95.9 08/14/2013   PLT 217 08/14/2013   Lab Results  Component Value Date   CREATININE 1.25* 08/17/2013   BUN 33* 08/17/2013   NA 133* 08/17/2013   K 4.1 08/17/2013   CL 101 08/17/2013   CO2 23 08/17/2013   Lab Results  Component Value Date   ALT 14 08/11/2013   AST 18 08/11/2013   PHOS 2.8 08/11/2013   BILITOT 0.4 08/11/2013    Physical Examination:  Filed Vitals:   11/09/13 1618  BP: 147/69  Pulse: 64  Temp: 98.1 F (36.7 C)    Wt Readings from Last 3 Encounters:  10/26/13 172 lb 3.2 oz (78.109 kg)  10/05/13 171 lb 9.6 oz (77.837 kg)  09/03/13 172 lb 8 oz (78.245 kg)   The  tympanic membranes are benign Patient is alert and responds promptly to questions. The oral cavity is free of secondary infection and is moist. A detailed neurological examination is performed which shows no focal deficits. Lungs - Normal respiratory effort, chest expands symmetrically. Lungs are clear to auscultation, no   wheezes. Mild bi- basilar crackles Heart has regular rhythm and rate  Abdomen is soft and non tender with normal bowel sounds  Assessment:  Patient tolerating treatments well  Plan: Continue treatment per original radiation prescription.  Patient undergo routine blood work tomorrow prior to her radiation treatment. She is at high risk for recurrence given her delays in treatment and I therefore will order a MRI the brain to rule out progressive disease.

## 2013-11-09 NOTE — Progress Notes (Signed)
[name] has had 13 fractions to her right front brain.  She reports that she almost fell a week ago and her lower back has been hurting with walking since.  She reports feeling dizzy.  She said she needs to use her walker at home.  She reports that she is having nausea.  She has run out of her protonix and is wondering if she can get a refill.  She reports that her eyes are burning and that the mask turns her left eyelashes in which are scratching her eye.  She reports occasional right sided headaches.  She reports taking decadron 4 mg at night.  She forgot to take her [name] this morning but will take it tonight.  She reports that she still has a cold and is coughing up green sputum but says that it is better.  The skin on her forehead and scalp is pink.  She is using biafine cream.

## 2013-11-10 ENCOUNTER — Ambulatory Visit: Payer: Medicare Other | Admitting: Radiation Oncology

## 2013-11-10 ENCOUNTER — Ambulatory Visit
Admission: RE | Admit: 2013-11-10 | Discharge: 2013-11-10 | Disposition: A | Discharge status: Home / Self Care | Payer: Medicare Other | Source: Ambulatory Visit | Attending: Radiation Oncology | Admitting: Radiation Oncology

## 2013-11-10 DIAGNOSIS — C719 Malignant neoplasm of brain, unspecified: Secondary | ICD-10-CM | POA: Insufficient documentation

## 2013-11-10 LAB — BASIC METABOLIC PANEL (CC13)
ANION GAP: 9 meq/L (ref 3–11)
BUN: 15.5 mg/dL (ref 7.0–26.0)
CHLORIDE: 105 meq/L (ref 98–109)
CO2: 24 meq/L (ref 22–29)
Calcium: 9.8 mg/dL (ref 8.4–10.4)
Creatinine: 1.2 mg/dL — ABNORMAL HIGH (ref 0.6–1.1)
GLUCOSE: 116 mg/dL (ref 70–140)
POTASSIUM: 4.5 meq/L (ref 3.5–5.1)
Sodium: 139 mEq/L (ref 136–145)

## 2013-11-10 LAB — CBC WITH DIFFERENTIAL/PLATELET
BASO%: 0.3 % (ref 0.0–2.0)
BASOS ABS: 0 10*3/uL (ref 0.0–0.1)
EOS ABS: 0.2 10*3/uL (ref 0.0–0.5)
EOS%: 1.9 % (ref 0.0–7.0)
HCT: 37.9 % (ref 34.8–46.6)
HEMOGLOBIN: 12.4 g/dL (ref 11.6–15.9)
LYMPH%: 10.3 % — ABNORMAL LOW (ref 14.0–49.7)
MCH: 29.5 pg (ref 25.1–34.0)
MCHC: 32.7 g/dL (ref 31.5–36.0)
MCV: 90.1 fL (ref 79.5–101.0)
MONO#: 0.6 10*3/uL (ref 0.1–0.9)
MONO%: 5.5 % (ref 0.0–14.0)
NEUT%: 82 % — ABNORMAL HIGH (ref 38.4–76.8)
NEUTROS ABS: 8.6 10*3/uL — AB (ref 1.5–6.5)
Platelets: 345 10*3/uL (ref 145–400)
RBC: 4.21 10*6/uL (ref 3.70–5.45)
RDW: 14 % (ref 11.2–14.5)
WBC: 10.5 10*3/uL — ABNORMAL HIGH (ref 3.9–10.3)
lymph#: 1.1 10*3/uL (ref 0.9–3.3)

## 2013-11-10 NOTE — Progress Notes (Addendum)
[name] was incontinent of bowels after her treatment today.  She said that she had taken a colace today.  Assisted her with cleaning up.  She reported that she was dizzy after treatment today.  Transported her to the lobby to check in for her lab appointment.  Notified Dr. Sondra Come that patient was dizzy after treatment today.

## 2013-11-11 ENCOUNTER — Ambulatory Visit: Payer: Medicare Other

## 2013-11-11 ENCOUNTER — Ambulatory Visit
Admission: RE | Admit: 2013-11-11 | Discharge: 2013-11-11 | Disposition: A | Discharge status: Home / Self Care | Payer: Medicare Other | Source: Ambulatory Visit | Attending: Radiation Oncology | Admitting: Radiation Oncology

## 2013-11-11 NOTE — Progress Notes (Addendum)
Lunabelle here after treatment.  She is dizzy again and said she is listing to the right today.  Yesterday she was going to the left.  She reports headaches on and off.  She took a colace yesterday and has not felt like eating since.  She has lost 6 lbs since 10/26/13.  Orthostatic vitals done: bp 129/68 sitting, hr 72, bp 144/77, hr 80.  Dr. Sondra Come notified of results.

## 2013-11-12 ENCOUNTER — Ambulatory Visit
Admission: RE | Admit: 2013-11-12 | Discharge: 2013-11-12 | Disposition: A | Discharge status: Home / Self Care | Payer: Medicare Other | Source: Ambulatory Visit | Attending: Radiation Oncology | Admitting: Radiation Oncology

## 2013-11-12 ENCOUNTER — Ambulatory Visit: Payer: Medicare Other

## 2013-11-13 ENCOUNTER — Ambulatory Visit: Payer: Medicare Other

## 2013-11-13 ENCOUNTER — Ambulatory Visit
Admission: RE | Admit: 2013-11-13 | Discharge: 2013-11-13 | Disposition: A | Discharge status: Home / Self Care | Payer: Medicare Other | Source: Ambulatory Visit | Attending: Radiation Oncology | Admitting: Radiation Oncology

## 2013-11-16 ENCOUNTER — Encounter: Admitting: Nutrition

## 2013-11-16 ENCOUNTER — Ambulatory Visit
Admission: RE | Admit: 2013-11-16 | Discharge: 2013-11-16 | Disposition: A | Discharge status: Home / Self Care | Payer: Medicare Other | Source: Ambulatory Visit | Attending: Radiation Oncology | Admitting: Radiation Oncology

## 2013-11-16 ENCOUNTER — Encounter: Payer: Self-pay | Admitting: Nutrition

## 2013-11-16 ENCOUNTER — Telehealth: Payer: Self-pay | Admitting: *Deleted

## 2013-11-16 ENCOUNTER — Ambulatory Visit: Payer: Medicare Other

## 2013-11-16 NOTE — Progress Notes (Signed)
Patient did not cancel or show for nutrition appointment.

## 2013-11-16 NOTE — Telephone Encounter (Signed)
PATIENT TO HAVE THIS TEST ON 11-17-13- ARRIVAL TIME - 10 AM @ DR. STERN'S OFFICE- ADDRESS [address], [place]. [phone], PATIENT AWARE OF THIS APPT.

## 2013-11-17 ENCOUNTER — Ambulatory Visit: Payer: Medicare Other

## 2013-11-17 ENCOUNTER — Encounter: Payer: Self-pay | Admitting: Radiation Oncology

## 2013-11-17 ENCOUNTER — Ambulatory Visit: Admission: RE | Admit: 2013-11-17 | Payer: Medicare Other | Source: Ambulatory Visit | Admitting: Radiation Oncology

## 2013-11-18 ENCOUNTER — Ambulatory Visit: Payer: Medicare Other

## 2013-11-18 ENCOUNTER — Ambulatory Visit (HOSPITAL_COMMUNITY): Payer: Medicare Other

## 2013-11-19 ENCOUNTER — Ambulatory Visit: Payer: Medicare Other

## 2013-11-20 ENCOUNTER — Ambulatory Visit: Payer: Medicare Other

## 2013-11-20 ENCOUNTER — Ambulatory Visit (HOSPITAL_COMMUNITY): Payer: Medicare Other

## 2013-11-20 ENCOUNTER — Encounter: Payer: Self-pay | Admitting: *Deleted

## 2013-11-20 ENCOUNTER — Telehealth: Payer: Self-pay | Admitting: Oncology

## 2013-11-20 NOTE — Progress Notes (Signed)
[facility]  Clinical Social Work continues to follow to assist with coordination of care due to many psychosocial concerns. CSW provided APS worker with requested information this week. CSW also was made aware pt missed her court appointment and APS was trying to locate pt. CSW shared appointment times with APS and also informed them that pt missed her treatments this week. They report they will follow as well.  [name], LCSW Clinical Social Worker [facility] Wednesday, Thursday and Friday Phone: [phone] Fax: [phone]

## 2013-11-20 NOTE — Telephone Encounter (Signed)
Called [name] to see if she is going to come for radiation today.  [name] said that she thought that since she had the MRI of her brain on 3/25, that she would need to go over the results with Dr. Sondra Come before having more radiation.  She said the radiation "is just burning me up."  Advised her that she will still need to complete her radiation treatments and that her treatment time today was 1:50.  She said her daughter had her car and that she was going to try to contact Dustin with the [facility] for a ride.

## 2013-11-23 ENCOUNTER — Ambulatory Visit: Payer: Medicare Other

## 2013-11-24 ENCOUNTER — Ambulatory Visit: Payer: Medicare Other

## 2013-11-24 ENCOUNTER — Telehealth: Payer: Self-pay | Admitting: Oncology

## 2013-11-24 ENCOUNTER — Ambulatory Visit: Payer: Medicare Other | Admitting: Radiation Oncology

## 2013-11-24 NOTE — Telephone Encounter (Signed)
Called [name] to see if she is OK and if she is coming to radiation this week.  [name] said she is OK but moving slow and that her shoulder hurts.  She said she bumped her shoulder on a door.  She said she will try to come for radiation tomorrow.

## 2013-11-25 ENCOUNTER — Ambulatory Visit
Admission: RE | Admit: 2013-11-25 | Discharge: 2013-11-25 | Disposition: A | Discharge status: Home / Self Care | Payer: Medicare Other | Source: Ambulatory Visit | Attending: Radiation Oncology | Admitting: Radiation Oncology

## 2013-11-25 ENCOUNTER — Encounter (HOSPITAL_COMMUNITY): Payer: Self-pay | Admitting: Emergency Medicine

## 2013-11-25 ENCOUNTER — Inpatient Hospital Stay (HOSPITAL_COMMUNITY)
Admission: EM | Admit: 2013-11-25 | Discharge: 2013-11-28 | DRG: 193 | Disposition: A | Discharge status: Home / Self Care | Payer: Medicare Other | Attending: Internal Medicine | Admitting: Internal Medicine

## 2013-11-25 ENCOUNTER — Ambulatory Visit: Payer: Medicare Other

## 2013-11-25 ENCOUNTER — Emergency Department (HOSPITAL_COMMUNITY): Payer: Medicare Other

## 2013-11-25 VITALS — BP 120/67 | HR 91 | Temp 99.0°F | Ht 64.0 in | Wt 160.6 lb

## 2013-11-25 DIAGNOSIS — M542 Cervicalgia: Secondary | ICD-10-CM

## 2013-11-25 DIAGNOSIS — F3289 Other specified depressive episodes: Secondary | ICD-10-CM

## 2013-11-25 DIAGNOSIS — J189 Pneumonia, unspecified organism: Principal | ICD-10-CM

## 2013-11-25 DIAGNOSIS — F419 Anxiety disorder, unspecified: Secondary | ICD-10-CM

## 2013-11-25 DIAGNOSIS — Z833 Family history of diabetes mellitus: Secondary | ICD-10-CM

## 2013-11-25 DIAGNOSIS — C719 Malignant neoplasm of brain, unspecified: Secondary | ICD-10-CM

## 2013-11-25 DIAGNOSIS — Z85850 Personal history of malignant neoplasm of thyroid: Secondary | ICD-10-CM

## 2013-11-25 DIAGNOSIS — K5900 Constipation, unspecified: Secondary | ICD-10-CM | POA: Diagnosis present

## 2013-11-25 DIAGNOSIS — F329 Major depressive disorder, single episode, unspecified: Secondary | ICD-10-CM | POA: Diagnosis present

## 2013-11-25 DIAGNOSIS — R5381 Other malaise: Secondary | ICD-10-CM

## 2013-11-25 DIAGNOSIS — G8190 Hemiplegia, unspecified affecting unspecified side: Secondary | ICD-10-CM

## 2013-11-25 DIAGNOSIS — R51 Headache: Secondary | ICD-10-CM

## 2013-11-25 DIAGNOSIS — Z7982 Long term (current) use of aspirin: Secondary | ICD-10-CM

## 2013-11-25 DIAGNOSIS — N182 Chronic kidney disease, stage 2 (mild): Secondary | ICD-10-CM

## 2013-11-25 DIAGNOSIS — Z803 Family history of malignant neoplasm of breast: Secondary | ICD-10-CM

## 2013-11-25 DIAGNOSIS — R531 Weakness: Secondary | ICD-10-CM

## 2013-11-25 DIAGNOSIS — E43 Unspecified severe protein-calorie malnutrition: Secondary | ICD-10-CM

## 2013-11-25 DIAGNOSIS — I10 Essential (primary) hypertension: Secondary | ICD-10-CM

## 2013-11-25 DIAGNOSIS — C73 Malignant neoplasm of thyroid gland: Secondary | ICD-10-CM

## 2013-11-25 DIAGNOSIS — I2510 Atherosclerotic heart disease of native coronary artery without angina pectoris: Secondary | ICD-10-CM | POA: Diagnosis present

## 2013-11-25 DIAGNOSIS — Z79899 Other long term (current) drug therapy: Secondary | ICD-10-CM

## 2013-11-25 DIAGNOSIS — M25511 Pain in right shoulder: Secondary | ICD-10-CM

## 2013-11-25 DIAGNOSIS — K279 Peptic ulcer, site unspecified, unspecified as acute or chronic, without hemorrhage or perforation: Secondary | ICD-10-CM

## 2013-11-25 DIAGNOSIS — R5383 Other fatigue: Secondary | ICD-10-CM

## 2013-11-25 DIAGNOSIS — R05 Cough: Secondary | ICD-10-CM

## 2013-11-25 DIAGNOSIS — N390 Urinary tract infection, site not specified: Secondary | ICD-10-CM

## 2013-11-25 DIAGNOSIS — M25519 Pain in unspecified shoulder: Secondary | ICD-10-CM | POA: Diagnosis present

## 2013-11-25 DIAGNOSIS — G9389 Other specified disorders of brain: Secondary | ICD-10-CM

## 2013-11-25 DIAGNOSIS — M810 Age-related osteoporosis without current pathological fracture: Secondary | ICD-10-CM | POA: Diagnosis present

## 2013-11-25 DIAGNOSIS — F39 Unspecified mood [affective] disorder: Secondary | ICD-10-CM | POA: Diagnosis present

## 2013-11-25 DIAGNOSIS — M129 Arthropathy, unspecified: Secondary | ICD-10-CM

## 2013-11-25 DIAGNOSIS — R059 Cough, unspecified: Secondary | ICD-10-CM

## 2013-11-25 DIAGNOSIS — E890 Postprocedural hypothyroidism: Secondary | ICD-10-CM | POA: Diagnosis present

## 2013-11-25 DIAGNOSIS — R4182 Altered mental status, unspecified: Secondary | ICD-10-CM | POA: Diagnosis present

## 2013-11-25 DIAGNOSIS — E785 Hyperlipidemia, unspecified: Secondary | ICD-10-CM

## 2013-11-25 DIAGNOSIS — J9600 Acute respiratory failure, unspecified whether with hypoxia or hypercapnia: Secondary | ICD-10-CM | POA: Diagnosis present

## 2013-11-25 DIAGNOSIS — I129 Hypertensive chronic kidney disease with stage 1 through stage 4 chronic kidney disease, or unspecified chronic kidney disease: Secondary | ICD-10-CM | POA: Diagnosis present

## 2013-11-25 DIAGNOSIS — R6889 Other general symptoms and signs: Secondary | ICD-10-CM

## 2013-11-25 DIAGNOSIS — R32 Unspecified urinary incontinence: Secondary | ICD-10-CM

## 2013-11-25 DIAGNOSIS — F411 Generalized anxiety disorder: Secondary | ICD-10-CM | POA: Diagnosis present

## 2013-11-25 DIAGNOSIS — K219 Gastro-esophageal reflux disease without esophagitis: Secondary | ICD-10-CM

## 2013-11-25 DIAGNOSIS — F32A Depression, unspecified: Secondary | ICD-10-CM

## 2013-11-25 DIAGNOSIS — Z8673 Personal history of transient ischemic attack (TIA), and cerebral infarction without residual deficits: Secondary | ICD-10-CM

## 2013-11-25 DIAGNOSIS — I6350 Cerebral infarction due to unspecified occlusion or stenosis of unspecified cerebral artery: Secondary | ICD-10-CM

## 2013-11-25 DIAGNOSIS — D496 Neoplasm of unspecified behavior of brain: Secondary | ICD-10-CM

## 2013-11-25 DIAGNOSIS — E039 Hypothyroidism, unspecified: Secondary | ICD-10-CM

## 2013-11-25 HISTORY — DX: Chronic kidney disease, stage 2 (mild): N18.2

## 2013-11-25 LAB — COMPREHENSIVE METABOLIC PANEL
ALT: 10 U/L (ref 0–35)
AST: 12 U/L (ref 0–37)
Albumin: 3.6 g/dL (ref 3.5–5.2)
Alkaline Phosphatase: 81 U/L (ref 39–117)
BILIRUBIN TOTAL: 0.7 mg/dL (ref 0.3–1.2)
BUN: 13 mg/dL (ref 6–23)
CHLORIDE: 100 meq/L (ref 96–112)
CO2: 22 meq/L (ref 19–32)
CREATININE: 1.13 mg/dL — AB (ref 0.50–1.10)
Calcium: 10.2 mg/dL (ref 8.4–10.5)
GFR calc Af Amer: 51 mL/min — ABNORMAL LOW (ref >90)
GFR, EST NON AFRICAN AMERICAN: 44 mL/min — AB (ref >90)
Glucose, Bld: 95 mg/dL (ref 70–99)
Potassium: 3.6 mEq/L — ABNORMAL LOW (ref 3.7–5.3)
Sodium: 137 mEq/L (ref 137–147)
Total Protein: 7.6 g/dL (ref 6.0–8.3)

## 2013-11-25 LAB — URINALYSIS, ROUTINE W REFLEX MICROSCOPIC
Glucose, UA: NEGATIVE mg/dL
Hgb urine dipstick: NEGATIVE
Ketones, ur: NEGATIVE mg/dL
Nitrite: NEGATIVE
PROTEIN: 30 mg/dL — AB
Specific Gravity, Urine: 1.025 (ref 1.005–1.030)
UROBILINOGEN UA: 1 mg/dL (ref 0.0–1.0)
pH: 5.5 (ref 5.0–8.0)

## 2013-11-25 LAB — CBC WITH DIFFERENTIAL/PLATELET
Basophils Absolute: 0 10*3/uL (ref 0.0–0.1)
Basophils Relative: 0 % (ref 0–1)
EOS PCT: 1 % (ref 0–5)
Eosinophils Absolute: 0.1 10*3/uL (ref 0.0–0.7)
HCT: 39.8 % (ref 36.0–46.0)
HEMOGLOBIN: 13 g/dL (ref 12.0–15.0)
Lymphocytes Relative: 10 % — ABNORMAL LOW (ref 12–46)
Lymphs Abs: 1.1 10*3/uL (ref 0.7–4.0)
MCH: 29.5 pg (ref 26.0–34.0)
MCHC: 32.7 g/dL (ref 30.0–36.0)
MCV: 90.5 fL (ref 78.0–100.0)
MONOS PCT: 10 % (ref 3–12)
Monocytes Absolute: 1 10*3/uL (ref 0.1–1.0)
NEUTROS ABS: 8.1 10*3/uL — AB (ref 1.7–7.7)
Neutrophils Relative %: 79 % — ABNORMAL HIGH (ref 43–77)
Platelets: 270 10*3/uL (ref 150–400)
RBC: 4.4 MIL/uL (ref 3.87–5.11)
RDW: 14 % (ref 11.5–15.5)
WBC: 10.2 10*3/uL (ref 4.0–10.5)

## 2013-11-25 LAB — URINE MICROSCOPIC-ADD ON

## 2013-11-25 MED ORDER — ONDANSETRON HCL 4 MG/2ML IJ SOLN: 4.0000 mg | Freq: Four times a day (QID) | INTRAMUSCULAR | Status: DC | PRN

## 2013-11-25 MED ORDER — POTASSIUM CHLORIDE CRYS ER 20 MEQ PO TBCR
20.0000 meq | EXTENDED_RELEASE_TABLET | Freq: Every day | ORAL | Status: AC
  Administered 2013-11-25 – 2013-11-27 (×3): 20 meq via ORAL
  Filled 2013-11-25 (×4): qty 1

## 2013-11-25 MED ORDER — INDOMETHACIN 25 MG PO CAPS
25.0000 mg | ORAL_CAPSULE | Freq: Two times a day (BID) | ORAL | Status: DC | PRN
  Filled 2013-11-25: qty 1

## 2013-11-25 MED ORDER — ALPRAZOLAM 0.25 MG PO TABS
0.2500 mg | ORAL_TABLET | Freq: Three times a day (TID) | ORAL | Status: DC | PRN
  Administered 2013-11-26: 0.25 mg via ORAL
  Filled 2013-11-25: qty 1

## 2013-11-25 MED ORDER — ONDANSETRON HCL 4 MG PO TABS: 4.0000 mg | ORAL_TABLET | Freq: Four times a day (QID) | ORAL | Status: DC | PRN

## 2013-11-25 MED ORDER — VANCOMYCIN HCL 10 G IV SOLR
1500.0000 mg | INTRAVENOUS | Status: AC
  Administered 2013-11-25: 1500 mg via INTRAVENOUS
  Filled 2013-11-25: qty 1500

## 2013-11-25 MED ORDER — GUAIFENESIN-DM 100-10 MG/5ML PO SYRP: 5.0000 mL | ORAL_SOLUTION | ORAL | Status: DC | PRN

## 2013-11-25 MED ORDER — ACETAMINOPHEN 650 MG RE SUPP: 650.0000 mg | Freq: Four times a day (QID) | RECTAL | Status: DC | PRN

## 2013-11-25 MED ORDER — CEFEPIME HCL 1 G IJ SOLR
1.0000 g | INTRAMUSCULAR | Status: DC
  Administered 2013-11-25 – 2013-11-26 (×2): 1 g via INTRAVENOUS
  Filled 2013-11-25 (×3): qty 1

## 2013-11-25 MED ORDER — TRAMADOL-ACETAMINOPHEN 37.5-325 MG PO TABS: 1.0000 | ORAL_TABLET | Freq: Four times a day (QID) | ORAL | Status: DC | PRN

## 2013-11-25 MED ORDER — LEVETIRACETAM 500 MG PO TABS
500.0000 mg | ORAL_TABLET | Freq: Two times a day (BID) | ORAL | Status: DC
  Administered 2013-11-25 – 2013-11-28 (×6): 500 mg via ORAL
  Filled 2013-11-25 (×7): qty 1

## 2013-11-25 MED ORDER — ENSURE COMPLETE PO LIQD: 237.0000 mL | Freq: Every day | ORAL | Status: DC | PRN

## 2013-11-25 MED ORDER — VANCOMYCIN HCL IN DEXTROSE 1-5 GM/200ML-% IV SOLN
1000.0000 mg | INTRAVENOUS | Status: DC
  Administered 2013-11-26: 1000 mg via INTRAVENOUS
  Filled 2013-11-25 (×2): qty 200

## 2013-11-25 MED ORDER — BENZONATATE 100 MG PO CAPS
100.0000 mg | ORAL_CAPSULE | Freq: Two times a day (BID) | ORAL | Status: DC
  Administered 2013-11-25 – 2013-11-28 (×6): 100 mg via ORAL
  Filled 2013-11-25 (×8): qty 1

## 2013-11-25 MED ORDER — SODIUM CHLORIDE 0.9 % IV SOLN: INTRAVENOUS | Status: DC

## 2013-11-25 MED ORDER — POLYETHYLENE GLYCOL 3350 17 G PO PACK
17.0000 g | PACK | Freq: Every day | ORAL | Status: DC
  Administered 2013-11-25 – 2013-11-28 (×4): 17 g via ORAL
  Filled 2013-11-25 (×4): qty 1

## 2013-11-25 MED ORDER — CLOPIDOGREL BISULFATE 75 MG PO TABS
75.0000 mg | ORAL_TABLET | Freq: Every day | ORAL | Status: DC
  Administered 2013-11-26 – 2013-11-28 (×3): 75 mg via ORAL
  Filled 2013-11-25 (×4): qty 1

## 2013-11-25 MED ORDER — POTASSIUM CHLORIDE IN NACL 20-0.9 MEQ/L-% IV SOLN
INTRAVENOUS | Status: DC
  Administered 2013-11-25: 21:00:00 via INTRAVENOUS
  Filled 2013-11-25 (×5): qty 1000

## 2013-11-25 MED ORDER — DEXAMETHASONE 4 MG PO TABS: 4.0000 mg | ORAL_TABLET | ORAL | Status: DC

## 2013-11-25 MED ORDER — LEVOTHYROXINE SODIUM 100 MCG PO TABS
100.0000 ug | ORAL_TABLET | Freq: Every day | ORAL | Status: DC
  Administered 2013-11-26: 100 ug via ORAL
  Filled 2013-11-25 (×2): qty 1

## 2013-11-25 MED ORDER — SODIUM CHLORIDE 0.9 % IV SOLN
INTRAVENOUS | Status: DC
  Administered 2013-11-25: 18:00:00 via INTRAVENOUS

## 2013-11-25 MED ORDER — ACETAMINOPHEN 325 MG PO TABS
650.0000 mg | ORAL_TABLET | Freq: Four times a day (QID) | ORAL | Status: DC | PRN
  Administered 2013-11-26: 650 mg via ORAL
  Filled 2013-11-25: qty 2

## 2013-11-25 MED ORDER — PRUTECT EX EMUL
Freq: Two times a day (BID) | CUTANEOUS | Status: DC
  Administered 2013-11-25 – 2013-11-27 (×4): via TOPICAL
  Filled 2013-11-25: qty 45

## 2013-11-25 MED ORDER — ALBUTEROL SULFATE (2.5 MG/3ML) 0.083% IN NEBU: 2.5000 mg | INHALATION_SOLUTION | RESPIRATORY_TRACT | Status: DC | PRN

## 2013-11-25 MED ORDER — DOCUSATE SODIUM 100 MG PO CAPS
100.0000 mg | ORAL_CAPSULE | Freq: Two times a day (BID) | ORAL | Status: DC
  Administered 2013-11-25 – 2013-11-28 (×6): 100 mg via ORAL
  Filled 2013-11-25 (×7): qty 1

## 2013-11-25 MED ORDER — PANTOPRAZOLE SODIUM 40 MG PO TBEC
40.0000 mg | DELAYED_RELEASE_TABLET | Freq: Two times a day (BID) | ORAL | Status: DC
  Administered 2013-11-25 – 2013-11-28 (×6): 40 mg via ORAL
  Filled 2013-11-25 (×8): qty 1

## 2013-11-25 MED ORDER — AMITRIPTYLINE HCL 25 MG PO TABS
25.0000 mg | ORAL_TABLET | Freq: Every day | ORAL | Status: DC
  Administered 2013-11-25 – 2013-11-27 (×3): 25 mg via ORAL
  Filled 2013-11-25 (×4): qty 1

## 2013-11-25 MED ORDER — METHOCARBAMOL 500 MG PO TABS: 500.0000 mg | ORAL_TABLET | Freq: Three times a day (TID) | ORAL | Status: DC | PRN

## 2013-11-25 MED ORDER — SODIUM CHLORIDE 0.9 % IV BOLUS (SEPSIS)
500.0000 mL | Freq: Once | INTRAVENOUS | Status: AC
  Administered 2013-11-25: 500 mL via INTRAVENOUS

## 2013-11-25 MED ORDER — ASPIRIN EC 81 MG PO TBEC
81.0000 mg | DELAYED_RELEASE_TABLET | Freq: Every day | ORAL | Status: DC
  Administered 2013-11-25 – 2013-11-28 (×4): 81 mg via ORAL
  Filled 2013-11-25 (×4): qty 1

## 2013-11-25 MED ORDER — DEXTROSE 5 % IV SOLN
1.0000 g | INTRAVENOUS | Status: DC
  Administered 2013-11-25: 1 g via INTRAVENOUS
  Filled 2013-11-25: qty 10

## 2013-11-25 MED ORDER — ALUM & MAG HYDROXIDE-SIMETH 200-200-20 MG/5ML PO SUSP: 30.0000 mL | Freq: Four times a day (QID) | ORAL | Status: DC | PRN

## 2013-11-25 MED ORDER — LOSARTAN POTASSIUM 50 MG PO TABS
50.0000 mg | ORAL_TABLET | Freq: Every day | ORAL | Status: DC
  Administered 2013-11-25 – 2013-11-28 (×4): 50 mg via ORAL
  Filled 2013-11-25 (×4): qty 1

## 2013-11-25 MED ORDER — SIMVASTATIN 20 MG PO TABS
20.0000 mg | ORAL_TABLET | Freq: Every day | ORAL | Status: DC
  Administered 2013-11-26 – 2013-11-27 (×2): 20 mg via ORAL
  Filled 2013-11-25 (×3): qty 1

## 2013-11-25 MED ORDER — HEPARIN SODIUM (PORCINE) 5000 UNIT/ML IJ SOLN
5000.0000 [IU] | Freq: Three times a day (TID) | INTRAMUSCULAR | Status: DC
  Administered 2013-11-25 – 2013-11-26 (×2): 5000 [IU] via SUBCUTANEOUS
  Filled 2013-11-25 (×5): qty 1

## 2013-11-25 NOTE — ED Notes (Signed)
Patient transported to X-ray 

## 2013-11-25 NOTE — Progress Notes (Signed)
Called report to [facility] ER.  Spoke to [place], Warehouse manager.  Patient was escorted to the ER in a wheelchair by Nicki Reaper, Nurse Transporter.  Tried to find patient's daughter, Mickel Baas, in the waiting room and parked outside the cancer center.  Was unable to find her.  Called and left a message on the home voicemail.

## 2013-11-25 NOTE — Addendum Note (Signed)
Encounter addended by: Jacqulyn Liner, RN on: 11/25/2013  4:47 PM<BR>     Documentation filed: Notes Section

## 2013-11-25 NOTE — ED Notes (Signed)
Per pt, went to oncology treatment today.  C/o having fever and tremors last pm and early this am.  MD sent her here for evaluation.  Pt receiving tx for brain tumor.

## 2013-11-25 NOTE — Progress Notes (Signed)
  Radiation Oncology         [phone] ________________________________  Name: [name] MRN: [id number]  Date: 11/25/2013  DOB: 05-30-1928  Weekly Radiation Therapy Management  Current Dose: 38 Gy     Planned Dose:  44 Gy  Narrative . . . . . . . . The patient presents for routine under treatment assessment.                                   The patient has multiple complaints today. She admits to falling on her left shoulder and has had pain in both the left and right shoulder since that episode. She also complains of pain in the right shoulder with coughing. She reports a temperature over 101 last night. Patient has had some coughing. She reports these she is out of many of her medications but due to home situation has been unable to obtain these medications.  Patient was reportedly brought by her daughter for radiation treatment today but we have been unable to locate the daughter.                                   Set-up films were reviewed.                                 The chart was checked. Physical Findings. . .  height is 5\' 4"  (1.626 m) and weight is 160 lb 9.6 oz (72.848 kg). Her temperature is 99 F (37.2 C). Her blood pressure is 120/67 and her pulse is 91. Her oxygen saturation is 97%. . Weight essentially stable. Patient has tenderness in the distal right clavicle. The patient shows some mild confusion.  Impression . . . . . . . The patient is tolerating radiation. Plan . . . . . . . . . . . . Continue treatment as planned.  Patient will be sent to the emergency room for further evaluation. She is difficult to read in terms of symptoms and is at risk for pneumonia or urinary tract infection. On my exam today the patient did have shaking chills which were impressive.  ________________________________   [name], PhD, MD

## 2013-11-25 NOTE — H&P (Signed)
[facility] History and Physical  [name] UYQ:[id number] DOB: Aug 16, 1928 DOA: 11/25/2013  Referring physician: ED physician, Dr.Steinl PCP: [name], MD  Radiation oncologist, Dr. Sondra Come Medical oncologist, Dr. Marin Olp  Chief Complaint: Fever, chills, cough, discomfort with urination.  HPI: [name] is a 80 y.o. female with a history of glioblastoma, undergoing radiation therapy, thyroid cancer- status post total thyroidectomy in 2010, hypertension, and hypothyroidism, who presents with a chief complaint of fever, chills, and cough. Her symptoms started last night. She had shaking chills and possible rigors. She had a fever of 101F. She has had a nonproductive cough for several days, but last week, she had a productive cough with green sputum. She denies chest pain or pleurisy or wheezing, but she has had mild shortness of breath. She has had discomfort with urination on and off for the past couple of days. She denies nausea, vomiting, upper abdominal pain, diarrhea, bloody stools, or melena. She endorses constipation which is chronic. She has also had a mild headache but without blurred vision or focal weakness. She has had generalized weakness for the past couple days.  In the emergency department, she is febrile with a temperature 100.6. Her lab data are significant for normal white blood cell count of 10.2, slightly low potassium of 3.6, and creatinine 1.13. Her chest x-ray reveals possible mild infiltration of the upper lobes and increased interstitial markings bilaterally compared to the chest x-ray on 07/18/13. She is being admitted for further evaluation and management.   Review of Systems:  As above in history present illness. In addition, she has chronic right shoulder pain which has worsened lately. Otherwise review of systems is negative.  Past Medical History  Diagnosis Date  . CVA 1998    "mini" cva, no residual deficts  . ARTHRITIS   .  URINARY INCONTINENCE   . DEPRESSION   . DDD (degenerative disc disease)     Cervical spine & knees  . GERD   . DYSLIPIDEMIA   . HYPERTENSION   . HYPOTHYROIDISM   . PEPTIC ULCER DISEASE 11/2009  . Osteoporosis   . CARCINOMA, THYROID GLAND, PAPILLARY 09/2008    s/p total thyroidectomy  . Glioblastoma   . CKD (chronic kidney disease) stage 2, GFR 60-89 ml/min    Past Surgical History  Procedure Laterality Date  . Cholecystectomy    . Abdominal hysterectomy    . Thyroidectomy  2/30/10    papillary, s/p total  . Back surgery    . Tonsillectomy    . Appendectomy    . Incontinence surgery    . Wrist surgery      LEFT  . Cardiovascular stress test  01/27/2003    NO EVIDENCE OF ISCHEMIA. EF 74%  . US echocardiography  03/16/2009    EF 55-60%  . US echocardiography  01/26/2003    EF 60-65%  . Craniotomy Right 08/13/2013    Procedure: RIGHT FRONTAL CRANIOTOMY FOR RESECTION OF TUMOR;  Surgeon: Consuella Lose, MD;  Location: [facility];  Service: Neurosurgery;  Laterality: Right;  right  . Bladder suspension    . Breast biopsy    . Cardiac catheterization  03/30/2005    NORMAL EF. NORMAL CORONARIES   Social History: She lives alone most of the time, but her daughter Mickel Baas stays with her on occasion. She is widowed. She is retired. She no longer drives. She denies tobacco use, but she does have an occasional glass of wine.  Allergies  Allergen Reactions  .  Iodine     REACTION: Nausea    Family History  Problem Relation Age of Onset  . Arthritis Mother   . Diabetes Mother   . Hyperlipidemia Mother   . Arthritis Father   . Diabetes Father   . Hyperlipidemia Father   . Breast cancer Maternal Grandmother   . Cancer Sister     breast     Prior to Admission medications   Medication Sig Start Date End Date Taking? Authorizing Provider  ALPRAZolam Duanne Moron) 0.25 MG tablet Take one tablet by mouth three times daily as needed for anxiety 08/24/13   Tiffany L Reed, DO  amitriptyline  (ELAVIL) 25 MG tablet Take 1 tablet (25 mg total) by mouth at bedtime. 08/03/13   Rowe Clack, MD  aspirin 81 MG tablet Take 81 mg by mouth daily.      Historical Provider, MD  clopidogrel (PLAVIX) 75 MG tablet Take 1 tablet (75 mg total) by mouth daily with breakfast. 08/03/13   Rowe Clack, MD  dexamethasone (DECADRON) 4 MG tablet Take 1 tablet (4 mg total) by mouth as directed. 08/19/13   Nishant Dhungel, MD  docusate sodium 100 MG CAPS Take 100 mg by mouth 2 (two) times daily. 08/19/13   Nishant Dhungel, MD  emollient (BIAFINE) cream Apply topically 2 (two) times daily.    Historical Provider, MD  feeding supplement, ENSURE COMPLETE, (ENSURE COMPLETE) LIQD Take 237 mLs by mouth daily as needed (suboptimal intake). 08/19/13   Nishant Dhungel, MD  fluconazole (DIFLUCAN) 100 MG tablet Take 100 mg by mouth daily. Take for 5 days starting 08/31/13    Historical Provider, MD  fluocinonide-emollient (LIDEX-E) 0.05 % cream Apply 1 application topically 2 (two) times a week.  02/14/11   Rowe Clack, MD  indomethacin (INDOCIN) 25 MG capsule Take 25 mg by mouth daily as needed for moderate pain.    Historical Provider, MD  indomethacin (INDOCIN) 25 MG capsule TAKE ONE CAPSULE BY MOUTH EVERY DAY AS NEEDED 09/11/13   Rowe Clack, MD  levETIRAcetam (KEPPRA) 500 MG tablet Take 500 mg by mouth 2 (two) times daily. 10/30/13   [name], MD  levothyroxine (SYNTHROID, LEVOTHROID) 100 MCG tablet Take 1 tablet (100 mcg total) by mouth daily. 08/03/13   Rowe Clack, MD  losartan (COZAAR) 50 MG tablet Take 1 tablet (50 mg total) by mouth daily. 08/03/13   Rowe Clack, MD  methocarbamol (ROBAXIN) 500 MG tablet Take 500-1,000 mg by mouth every 8 (eight) hours as needed for muscle spasms.    Historical Provider, MD  pantoprazole (PROTONIX) 40 MG tablet Take 40 mg by mouth 2 (two) times daily.      Historical Provider, MD  polyethylene glycol (MIRALAX / GLYCOLAX) packet Take 17 g by  mouth daily as needed.    Historical Provider, MD  pravastatin (PRAVACHOL) 40 MG tablet Take 40 mg by mouth daily.    Historical Provider, MD  traMADol-acetaminophen (ULTRACET) 37.5-325 MG per tablet Take one tablet by mouth every 6 hours as needed for moderate pain 08/24/13   Gayland Curry, DO   Physical Exam: Filed Vitals:   11/25/13 1925  BP: 131/60  Pulse: 76  Temp: 100.6 F (38.1 C)  Resp: 16    BP 131/60  Pulse 76  Temp(Src) 100.6 F (38.1 C) (Oral)  Resp 16  Ht 5' 4.17" (1.63 m)  Wt 72.8 kg (160 lb 7.9 oz)  BMI 27.40 kg/m2  SpO2 95%  General:  Appears calm and comfortable Eyes: PERRL, normal lids, irises & conjunctiva ENT: grossly normal hearing, lips & tongue Neck: no LAD, masses or thyromegaly Cardiovascular: RRR, no m/r/g. No LE edema. Telemetry: SR, no arrhythmias  Respiratory: Occasional crackles auscultated in the upper lobes. Normal respiratory effort. Abdomen: soft, ntnd Skin: no rash or induration seen on limited exam Musculoskeletal: grossly normal tone BUE/BLE; mild tenderness over the right shoulder joint without crepitus, warmth, or erythema; good range of motion. Psychiatric: grossly normal mood and affect, speech fluent and appropriate Neurologic: grossly non-focal.cranial nerves II through XII are grossly intact.           Labs on Admission:  Basic Metabolic Panel:  Recent Labs Lab 11/25/13 1746  NA 137  K 3.6*  CL 100  CO2 22  GLUCOSE 95  BUN 13  CREATININE 1.13*  CALCIUM 10.2   Liver Function Tests:  Recent Labs Lab 11/25/13 1746  AST 12  ALT 10  ALKPHOS 81  BILITOT 0.7  PROT 7.6  ALBUMIN 3.6   No results found for this basename: LIPASE, AMYLASE,  in the last 168 hours No results found for this basename: AMMONIA,  in the last 168 hours CBC:  Recent Labs Lab 11/25/13 1746  WBC 10.2  NEUTROABS 8.1*  HGB 13.0  HCT 39.8  MCV 90.5  PLT 270   Cardiac Enzymes: No results found for this basename: CKTOTAL, CKMB,  CKMBINDEX, TROPONINI,  in the last 168 hours  BNP (last 3 results) No results found for this basename: PROBNP,  in the last 8760 hours CBG: No results found for this basename: GLUCAP,  in the last 168 hours  Radiological Exams on Admission: Dg Chest 2 View  11/25/2013   CLINICAL DATA:  Fever and cough.  EXAM: CHEST  2 VIEW  COMPARISON:  CT CHEST W/O CM dated 08/11/2013; DG CHEST 2 VIEW dated 07/18/2013  FINDINGS: Mediastinum and hilar structures are normal. Mild infiltrate in the right and left upper lobes cannot be excluded. Underlying nodular densities in this region cannot be excluded. These findings may just represent overlapping structures. Followup and lateral chest x-rays are suggested. Pleural thickening is again noted. No pleural effusion or pneumothorax. Diffuse interstitial prominence is present, more prominent than prior study of 07/18/2013 suggest a the possibility of active pulmonary interstitial lung disease including pneumonitis. Again follow-up chest x-ray suggested. Borderline cardiomegaly, no pulmonary venous congestion. No acute bony abnormality. Surgical clips right upper quadrant.  IMPRESSION: 1. Cannot exclude mild infiltration upper lobes. These changes may just represent overlapping shadows. Follow-up PA and lateral chest x-rays suggested. 2. Increased interstitial markings bilaterally when compared to prior chest x-ray of 07/18/2013 and prior chest CT of 08/11/2013. Active interstitial lung disease cannot be excluded and follow-up chest x-rays suggested.   Electronically Signed   By: Marcello Moores  Register   On: 11/25/2013 17:32    EKG: Not done  Assessment/Plan Principal Problem:   Urinary tract infection Active Problems:   HCAP (healthcare-associated pneumonia)   Glioblastoma multiforme of brain   HYPOTHYROIDISM   Generalized weakness   CKD (chronic kidney disease) stage 2, GFR 60-89 ml/min   HTN (hypertension)   Anxiety and depression   Right shoulder  pain   1. Urinary tract infection. The patient's urine was not a clean catch, but she endorses symptomatic dysuria. She does not appear to be toxic and has no significant abdominal pain, nausea vomiting. She is mildly febrile. She received Rocephin in the ED. Urine culture pending. We'll change antibiotics  as indicated below. We'll gently hydrate her. 2. Possible healthcare acquired pneumonia. The patient endorses a cough, now nonproductive, but it had been productive last week. Will treat her with cefepime and vancomycin empirically. 3. Glioblastoma multiform of the brain. The patient is actively undergoing radiation therapy by Dr. Sondra Come. He will need to be consulted were notified of the patient's admission tomorrow. 4. Hypertension. Currently stable on losartan. 5. Chronic kidney disease. Her creatinine appears to be at baseline.  6. Hypothyroidism. We'll continue Synthroid. We'll order TSH and free T4. 7. Generalized weakness. No focal deficits noted. We'll order PT consultation. 8. Right shoulder pain. This appears to be chronic. We'll continue her chronic pain medications.    Code Status: Full code Family Communication: Discussed briefly with the patient's daughter Mickel Baas on the phone. Disposition Plan: To be determined.  Time spent: One hour.  [facility] Pager [phone]

## 2013-11-25 NOTE — Progress Notes (Signed)
ANTIBIOTIC CONSULT NOTE - INITIAL  Pharmacy Consult for Vancomycin Indication: PNA  Allergies  Allergen Reactions  . Iodine     REACTION: Nausea    Patient Measurements:   Adjusted Body Weight:   Vital Signs: Temp: 100.6 F (38.1 C) (04/01 1925) Temp src: Oral (04/01 1925) BP: 131/60 mmHg (04/01 1925) Pulse Rate: 76 (04/01 1925) Intake/Output from previous day:   Intake/Output from this shift:    Labs:  Recent Labs  11/25/13 1746  WBC 10.2  HGB 13.0  PLT 270  CREATININE 1.13*   The CrCl is unknown because both a height and weight (above a minimum accepted value) are required for this calculation. No results found for this basename: VANCOTROUGH, VANCOPEAK, VANCORANDOM, GENTTROUGH, GENTPEAK, GENTRANDOM, TOBRATROUGH, TOBRAPEAK, TOBRARND, AMIKACINPEAK, AMIKACINTROU, AMIKACIN,  in the last 72 hours   Microbiology: No results found for this or any previous visit (from the past 720 hour(s)).  Medical History: Past Medical History  Diagnosis Date  . CVA 1998    "mini" cva, no residual deficts  . ARTHRITIS   . URINARY INCONTINENCE   . DEPRESSION   . DDD (degenerative disc disease)     Cervical spine & knees  . GERD   . DYSLIPIDEMIA   . HYPERTENSION   . HYPOTHYROIDISM   . PEPTIC ULCER DISEASE 11/2009  . Osteoporosis   . CARCINOMA, THYROID GLAND, PAPILLARY 09/2008    s/p total thyroidectomy  . Glioblastoma   . CKD (chronic kidney disease) stage 2, GFR 60-89 ml/min     Assessment: 40 yoF with PMHx CVA, glioblastoma, and thyroid carcinoma s/p thyroidectomy presents with persistent shaking chills and weakness.  CXR without definite infiltrate.  U/A: +many bacteria, small leukocytes.  Pharmacy consulted to dose Vancomycin for PNA.    4/1 >> CTX x1 >> 4/1 4/1 >> Vancomycin  >>   4/1 >> Cefepime >>  Tmax: 100.6 WBCs:10.2 Renal: CKD-II, SCr 1.13, CrCl 37  4/1 urine: ordered   Goal of Therapy:  Vancomycin trough level 15-20 mcg/ml  Plan:  Vancomycin 1500mg   IV x 1, then 1g IV q24h Adjust cefepime to 1g IV q24h F/u renal fxn, cultures, clinical course, VT at Css  Ralene Bathe, PharmD, BCPS 11/25/2013, 8:06 PM  Pager: [phone]

## 2013-11-25 NOTE — ED Provider Notes (Addendum)
CSN: [id number]     Arrival date & time 11/25/13  1638 History   First MD Initiated Contact with Patient 11/25/13 1700     Chief Complaint  Patient presents with  . Fever  . Cough     (Consider location/radiation/quality/duration/timing/severity/associated sxs/prior Treatment) Patient is a 80 y.o. female presenting with fever and cough. The history is provided by the patient.  Fever Associated symptoms: chills and cough   Associated symptoms: no chest pain, no confusion, no diarrhea, no dysuria, no headaches, no rash, no sore throat and no vomiting   Cough Associated symptoms: chills and fever   Associated symptoms: no chest pain, no eye discharge, no headaches, no rash, no shortness of breath and no sore throat   pt with hx GBM, s/p resection 12/14, c/o fevers/chills for the past 1-2 days. Symptoms acute in onset, fever waxes and wanes, tm 101. Chills. No sweats. Recent non prod cough. Denies sob.  No sore throat or runny nose. No headaches, no neck pain or stiffness. No abd pain or nvd. No dysuria or gu c/o. No rash. No chemotherapy, +currently getting radiation tx.  States compliant w normal meds, although has refills at pharmacy that she needs to pick up.    Past Medical History  Diagnosis Date  . CVA 1998    "mini" cva, no residual deficts  . ARTHRITIS   . URINARY INCONTINENCE   . DEPRESSION   . DDD (degenerative disc disease)     Cervical spine & knees  . GERD   . DYSLIPIDEMIA   . HYPERTENSION   . HYPOTHYROIDISM   . PEPTIC ULCER DISEASE 11/2009  . CARCINOMA, THYROID GLAND, PAPILLARY 09/2008    s/p total thyroidectomy  . Osteoporosis   . Glioblastoma    Past Surgical History  Procedure Laterality Date  . Cholecystectomy    . Abdominal hysterectomy    . Thyroidectomy  2/30/10    papillary, s/p total  . Breast biopsy    . Back surgery    . Tonsillectomy    . Appendectomy    . Incontinence surgery    . Wrist surgery      LEFT  . Cardiac catheterization  03/30/2005    NORMAL EF. NORMAL CORONARIES  . Cardiovascular stress test  01/27/2003    NO EVIDENCE OF ISCHEMIA. EF 74%  . US echocardiography  03/16/2009    EF 55-60%  . US echocardiography  01/26/2003    EF 60-65%  . Craniotomy Right 08/13/2013    Procedure: RIGHT FRONTAL CRANIOTOMY FOR RESECTION OF TUMOR;  Surgeon: Consuella Lose, MD;  Location: [facility];  Service: Neurosurgery;  Laterality: Right;  right  . Bladder suspension     Family History  Problem Relation Age of Onset  . Arthritis Mother   . Diabetes Mother   . Hyperlipidemia Mother   . Arthritis Father   . Diabetes Father   . Hyperlipidemia Father   . Breast cancer Maternal Grandmother   . Cancer Sister     breast   History  Substance Use Topics  . Smoking status: Never Smoker   . Smokeless tobacco: Not on file     Comment: widowed, lives with daughter "who is an addict" per pt. Retired, enjoying gardening  . Alcohol Use: No   OB History   Grav Para Term Preterm Abortions TAB SAB Ect Mult Living                 Review of Systems  Constitutional: Positive for  fever and chills.  HENT: Negative for sinus pressure and sore throat.   Eyes: Negative for discharge and redness.  Respiratory: Positive for cough. Negative for shortness of breath.   Cardiovascular: Negative for chest pain and leg swelling.  Gastrointestinal: Negative for vomiting, abdominal pain and diarrhea.  Genitourinary: Negative for dysuria and flank pain.  Musculoskeletal: Negative for back pain, neck pain and neck stiffness.  Skin: Negative for rash.  Neurological: Negative for headaches.  Hematological: Does not bruise/bleed easily.  Psychiatric/Behavioral: Negative for confusion.      Allergies  Iodine  Home Medications   Current Outpatient Rx  Name  Route  Sig  Dispense  Refill  . ALPRAZolam (XANAX) 0.25 MG tablet      Take one tablet by mouth three times daily as needed for anxiety   90 tablet   5   . amitriptyline (ELAVIL) 25 MG  tablet   Oral   Take 1 tablet (25 mg total) by mouth at bedtime.   30 tablet   5   . aspirin 81 MG tablet   Oral   Take 81 mg by mouth daily.           . clopidogrel (PLAVIX) 75 MG tablet   Oral   Take 1 tablet (75 mg total) by mouth daily with breakfast.   30 tablet   11   . dexamethasone (DECADRON) 4 MG tablet   Oral   Take 1 tablet (4 mg total) by mouth as directed.   17 tablet   0     Take 4 mg every 8 hours for next 1 days, then 4 mg ...   . docusate sodium 100 MG CAPS   Oral   Take 100 mg by mouth 2 (two) times daily.   10 capsule   0   . emollient (BIAFINE) cream   Topical   Apply topically 2 (two) times daily.         . feeding supplement, ENSURE COMPLETE, (ENSURE COMPLETE) LIQD   Oral   Take 237 mLs by mouth daily as needed (suboptimal intake).   30 Bottle   0   . fluconazole (DIFLUCAN) 100 MG tablet   Oral   Take 100 mg by mouth daily. Take for 5 days starting 08/31/13         . fluocinonide-emollient (LIDEX-E) 0.05 % cream   Topical   Apply 1 application topically 2 (two) times a week.          . indomethacin (INDOCIN) 25 MG capsule   Oral   Take 25 mg by mouth daily as needed for moderate pain.         . indomethacin (INDOCIN) 25 MG capsule      TAKE ONE CAPSULE BY MOUTH EVERY DAY AS NEEDED   60 capsule   1   . levETIRAcetam (KEPPRA) 500 MG tablet   Oral   Take 500 mg by mouth 2 (two) times daily.         Marland Kitchen levothyroxine (SYNTHROID, LEVOTHROID) 100 MCG tablet   Oral   Take 1 tablet (100 mcg total) by mouth daily.   30 tablet   11   . losartan (COZAAR) 50 MG tablet   Oral   Take 1 tablet (50 mg total) by mouth daily.   30 tablet   11   . methocarbamol (ROBAXIN) 500 MG tablet   Oral   Take 500-1,000 mg by mouth every 8 (eight) hours as needed for muscle spasms.         Marland Kitchen  pantoprazole (PROTONIX) 40 MG tablet   Oral   Take 40 mg by mouth 2 (two) times daily.           . polyethylene glycol (MIRALAX / GLYCOLAX)  packet   Oral   Take 17 g by mouth daily as needed.         . pravastatin (PRAVACHOL) 40 MG tablet   Oral   Take 40 mg by mouth daily.         . traMADol-acetaminophen (ULTRACET) 37.5-325 MG per tablet      Take one tablet by mouth every 6 hours as needed for moderate pain   120 tablet   0    BP 111/63  Pulse 82  Temp(Src) 99.7 F (37.6 C) (Oral)  Resp 20  SpO2 95% Physical Exam  Nursing note and vitals reviewed. Constitutional: She is oriented to person, place, and time. She appears well-developed and well-nourished. No distress.  HENT:  Nose: Nose normal.  Mouth/Throat: Oropharynx is clear and moist.  Eyes: Conjunctivae are normal. Pupils are equal, round, and reactive to light. No scleral icterus.  Neck: Normal range of motion. Neck supple. No tracheal deviation present.  No stiffness or rigidity  Cardiovascular: Normal rate, regular rhythm, normal heart sounds and intact distal pulses.   No murmur heard. Pulmonary/Chest: Effort normal and breath sounds normal. No respiratory distress.  Abdominal: Soft. Normal appearance and bowel sounds are normal. She exhibits no distension and no mass. There is no tenderness. There is no rebound and no guarding.  Genitourinary:  No cva tenderness  Musculoskeletal: She exhibits no edema.  Neurological: She is alert and oriented to person, place, and time.  Skin: Skin is warm and dry. No rash noted. She is not diaphoretic.  Psychiatric: She has a normal mood and affect.    ED Course  Procedures (including critical care time)   Results for orders placed during the hospital encounter of 11/25/13  CBC WITH DIFFERENTIAL      Result Value Ref Range   WBC 10.2  4.0 - 10.5 K/uL   RBC 4.40  3.87 - 5.11 MIL/uL   Hemoglobin 13.0  12.0 - 15.0 g/dL   HCT 39.8  36.0 - 46.0 %   MCV 90.5  78.0 - 100.0 fL   MCH 29.5  26.0 - 34.0 pg   MCHC 32.7  30.0 - 36.0 g/dL   RDW 14.0  11.5 - 15.5 %   Platelets 270  150 - 400 K/uL   Neutrophils  Relative % 79 (*) 43 - 77 %   Neutro Abs 8.1 (*) 1.7 - 7.7 K/uL   Lymphocytes Relative 10 (*) 12 - 46 %   Lymphs Abs 1.1  0.7 - 4.0 K/uL   Monocytes Relative 10  3 - 12 %   Monocytes Absolute 1.0  0.1 - 1.0 K/uL   Eosinophils Relative 1  0 - 5 %   Eosinophils Absolute 0.1  0.0 - 0.7 K/uL   Basophils Relative 0  0 - 1 %   Basophils Absolute 0.0  0.0 - 0.1 K/uL  COMPREHENSIVE METABOLIC PANEL      Result Value Ref Range   Sodium 137  137 - 147 mEq/L   Potassium 3.6 (*) 3.7 - 5.3 mEq/L   Chloride 100  96 - 112 mEq/L   CO2 22  19 - 32 mEq/L   Glucose, Bld 95  70 - 99 mg/dL   BUN 13  6 - 23 mg/dL   Creatinine, Ser  1.13 (*) 0.50 - 1.10 mg/dL   Calcium 10.2  8.4 - 10.5 mg/dL   Total Protein 7.6  6.0 - 8.3 g/dL   Albumin 3.6  3.5 - 5.2 g/dL   AST 12  0 - 37 U/L   ALT 10  0 - 35 U/L   Alkaline Phosphatase 81  39 - 117 U/L   Total Bilirubin 0.7  0.3 - 1.2 mg/dL   GFR calc non Af Amer 44 (*) >90 mL/min   GFR calc Af Amer 51 (*) >90 mL/min  URINALYSIS, ROUTINE W REFLEX MICROSCOPIC      Result Value Ref Range   Color, Urine AMBER (*) YELLOW   APPearance CLOUDY (*) CLEAR   Specific Gravity, Urine 1.025  1.005 - 1.030   pH 5.5  5.0 - 8.0   Glucose, UA NEGATIVE  NEGATIVE mg/dL   Hgb urine dipstick NEGATIVE  NEGATIVE   Bilirubin Urine SMALL (*) NEGATIVE   Ketones, ur NEGATIVE  NEGATIVE mg/dL   Protein, ur 30 (*) NEGATIVE mg/dL   Urobilinogen, UA 1.0  0.0 - 1.0 mg/dL   Nitrite NEGATIVE  NEGATIVE   Leukocytes, UA SMALL (*) NEGATIVE  URINE MICROSCOPIC-ADD ON      Result Value Ref Range   Squamous Epithelial / LPF MANY (*) RARE   WBC, UA 7-10  <3 WBC/hpf   RBC / HPF 0-2  <3 RBC/hpf   Bacteria, UA MANY (*) RARE   Urine-Other MUCOUS PRESENT     Dg Chest 2 View  11/25/2013   CLINICAL DATA:  Fever and cough.  EXAM: CHEST  2 VIEW  COMPARISON:  CT CHEST W/O CM dated 08/11/2013; DG CHEST 2 VIEW dated 07/18/2013  FINDINGS: Mediastinum and hilar structures are normal. Mild infiltrate in the right  and left upper lobes cannot be excluded. Underlying nodular densities in this region cannot be excluded. These findings may just represent overlapping structures. Followup and lateral chest x-rays are suggested. Pleural thickening is again noted. No pleural effusion or pneumothorax. Diffuse interstitial prominence is present, more prominent than prior study of 07/18/2013 suggest a the possibility of active pulmonary interstitial lung disease including pneumonitis. Again follow-up chest x-ray suggested. Borderline cardiomegaly, no pulmonary venous congestion. No acute bony abnormality. Surgical clips right upper quadrant.  IMPRESSION: 1. Cannot exclude mild infiltration upper lobes. These changes may just represent overlapping shadows. Follow-up PA and lateral chest x-rays suggested. 2. Increased interstitial markings bilaterally when compared to prior chest x-ray of 07/18/2013 and prior chest CT of 08/11/2013. Active interstitial lung disease cannot be excluded and follow-up chest x-rays suggested.   Electronically Signed   By: Marcello Moores  Register   On: 11/25/2013 17:32      MDM  Iv ns. Labs. Reviewed nursing notes and prior charts for additional history.   cxr without definite infiltrate.   ua w many bact, 7-10 wbc, will culture and rx.  Rocephin iv.    Iv ns bolus.  Pt w persistent shaking chills in ED, gen weak, med service called to admit.  Discussed w Dr Caryn Section, who indicates temp order, gen med bed.     Mirna Mires, MD 11/25/13 [phone]

## 2013-11-25 NOTE — Progress Notes (Signed)
[name] has had 19 fractions to her right front brain.  She is having pain in both shoulders.  The right shoulder hurts 8/10 when she coughs.  The pain radiates through her chest.  Her left shoulder she hurt when she fell at home last week.  She reports feeling dizzy all the time.  She denies trouble swallowing.  She has lost 4 lbs since 3/18.  She missed her dietician appointment. She reports not having an appetite.  She reports drinking a lot of ginger ale.  She also reports she wants her daughter to take her for bread and milk but she has not.  Orthostatic vitals done: bp 132/64, hr 78, standing 120/67, hr 91.  She reports having a tremor that started this weekend.  She reports having a fever last night of 101.  It was 99 degree today.  Patient reports being out of her synthroid and protonix.  She needs to go to [facility] to pick them up.  She is out of medication for her back (indocin) and says she needs to go to her orthopedic doctor.  The skin on her forehead is red.  She is using biafine cream.

## 2013-11-26 ENCOUNTER — Ambulatory Visit
Admission: RE | Admit: 2013-11-26 | Discharge: 2013-11-26 | Disposition: A | Discharge status: Home / Self Care | Payer: Medicare Other | Source: Ambulatory Visit | Attending: Radiation Oncology | Admitting: Radiation Oncology

## 2013-11-26 ENCOUNTER — Ambulatory Visit: Payer: Medicare Other

## 2013-11-26 ENCOUNTER — Inpatient Hospital Stay (HOSPITAL_COMMUNITY): Payer: Medicare Other

## 2013-11-26 DIAGNOSIS — F39 Unspecified mood [affective] disorder: Secondary | ICD-10-CM

## 2013-11-26 DIAGNOSIS — E43 Unspecified severe protein-calorie malnutrition: Secondary | ICD-10-CM | POA: Insufficient documentation

## 2013-11-26 DIAGNOSIS — G939 Disorder of brain, unspecified: Secondary | ICD-10-CM

## 2013-11-26 LAB — CBC
HCT: 34.9 % — ABNORMAL LOW (ref 36.0–46.0)
HEMOGLOBIN: 11.4 g/dL — AB (ref 12.0–15.0)
MCH: 29.7 pg (ref 26.0–34.0)
MCHC: 32.7 g/dL (ref 30.0–36.0)
MCV: 90.9 fL (ref 78.0–100.0)
Platelets: 224 10*3/uL (ref 150–400)
RBC: 3.84 MIL/uL — ABNORMAL LOW (ref 3.87–5.11)
RDW: 14.1 % (ref 11.5–15.5)
WBC: 7.3 10*3/uL (ref 4.0–10.5)

## 2013-11-26 LAB — BASIC METABOLIC PANEL
BUN: 11 mg/dL (ref 6–23)
CO2: 24 meq/L (ref 19–32)
Calcium: 8.9 mg/dL (ref 8.4–10.5)
Chloride: 108 mEq/L (ref 96–112)
Creatinine, Ser: 1.17 mg/dL — ABNORMAL HIGH (ref 0.50–1.10)
GFR calc Af Amer: 49 mL/min — ABNORMAL LOW (ref >90)
GFR calc non Af Amer: 42 mL/min — ABNORMAL LOW (ref >90)
GLUCOSE: 92 mg/dL (ref 70–99)
POTASSIUM: 4.3 meq/L (ref 3.7–5.3)
SODIUM: 140 meq/L (ref 137–147)

## 2013-11-26 LAB — TSH: TSH: 24.32 u[IU]/mL — AB (ref 0.350–4.500)

## 2013-11-26 LAB — T4, FREE: Free T4: 0.67 ng/dL — ABNORMAL LOW (ref 0.80–1.80)

## 2013-11-26 MED ORDER — LEVOTHYROXINE SODIUM 125 MCG PO TABS
125.0000 ug | ORAL_TABLET | Freq: Every day | ORAL | Status: DC
  Administered 2013-11-27 – 2013-11-28 (×2): 125 ug via ORAL
  Filled 2013-11-26 (×3): qty 1

## 2013-11-26 NOTE — Progress Notes (Signed)
INITIAL NUTRITION ASSESSMENT  Pt meets criteria for severe MALNUTRITION in the context of chronic illness as evidenced by 7% weight loss with <75% estimated energy intake in the past month.  DOCUMENTATION CODES Per approved criteria  -Severe malnutrition in the context of chronic illness   INTERVENTION: - Encouraged small frequent high calorie/protein meals to improve appetite. Not interested in nutritional supplements at this time.  - Will continue to monitor   NUTRITION DIAGNOSIS: Increased nutrient needs related to glioblastoma on radiation therapy as evidenced by MD notes.    Goal: Pt to consume >90% of meals  Monitor:  Weights, labs, intake   Reason for Assessment: Malnutrition screening tool   80 y.o. female  Admitting Dx: Urinary tract infection  ASSESSMENT: Admitted with fever, chills, cough, and discomfort with urination. Hx of glioblastoma s/p resection in December 2014, now undergoing radiation, thyroid CA s/p thyroidectomy in 2010, HTN, and hypothyroidism.  Pt seen by inpatient RD during admission in December 2014 who noted pt was eating well, 75% of meals, and weighed 176 pounds. Pt now weighs 160 pounds. Met with pt who c/o problems chewing and requested ground meats. States her appetite has been on/off for the past few months and due to changes in thyroid medications, she has felt sluggish. Also c/o stomach pain PTA limiting her intake. Does not like Ensure. PO intake since admission has been 100%.    Height: Ht Readings from Last 1 Encounters:  11/25/13 5' 4.17" (1.63 m)    Weight: Wt Readings from Last 1 Encounters:  11/25/13 160 lb 7.9 oz (72.8 kg)    Ideal Body Weight: 120 lbs   % Ideal Body Weight: 133%  Wt Readings from Last 10 Encounters:  11/25/13 160 lb 7.9 oz (72.8 kg)  11/25/13 160 lb 9.6 oz (72.848 kg)  11/11/13 164 lb 8 oz (74.617 kg)  10/26/13 172 lb 3.2 oz (78.109 kg)  10/05/13 171 lb 9.6 oz (77.837 kg)  09/03/13 172 lb 8 oz (78.245  kg)  08/14/13 176 lb 9.4 oz (80.1 kg)  08/14/13 176 lb 9.4 oz (80.1 kg)  08/03/13 179 lb 1.3 oz (81.23 kg)  07/18/13 174 lb (78.926 kg)    Usual Body Weight: 176 lbs   % Usual Body Weight: 91%  BMI:  Body mass index is 27.4 kg/(m^2).  Estimated Nutritional Needs: Kcal: 1450-1650 Protein: 80-90g Fluid: 1.4-1.6L/day  Skin: Intact   Diet Order: Cardiac  EDUCATION NEEDS: -No education needs identified at this time   Intake/Output Summary (Last 24 hours) at 11/26/13 1319 Last data filed at 11/26/13 0600  Gross per 24 hour  Intake 1539.83 ml  Output      0 ml  Net 1539.83 ml    Last BM: 4/1   Labs:   Recent Labs Lab 11/25/13 1746 11/26/13 0534  NA 137 140  K 3.6* 4.3  CL 100 108  CO2 22 24  BUN 13 11  CREATININE 1.13* 1.17*  CALCIUM 10.2 8.9  GLUCOSE 95 92    CBG (last 3)  No results found for this basename: GLUCAP,  in the last 72 hours  Scheduled Meds: . amitriptyline  25 mg Oral QHS  . aspirin EC  81 mg Oral Daily  . benzonatate  100 mg Oral BID  . ceFEPime (MAXIPIME) IV  1 g Intravenous Q24H  . clopidogrel  75 mg Oral Q breakfast  . docusate sodium  100 mg Oral BID  . levETIRAcetam  500 mg Oral BID  . levothyroxine  100 mcg Oral QAC breakfast  . losartan  50 mg Oral Daily  . pantoprazole  40 mg Oral BID  . polyethylene glycol  17 g Oral Daily  . potassium chloride  20 mEq Oral Daily  . PRUTECT   Topical BID  . simvastatin  20 mg Oral q1800  . vancomycin  1,000 mg Intravenous Q24H    Continuous Infusions: . 0.9 % NaCl with KCl 20 mEq / L 70 mL/hr at 11/25/13 2043    Past Medical History  Diagnosis Date  . CVA 1998    "mini" cva, no residual deficts  . ARTHRITIS   . URINARY INCONTINENCE   . DEPRESSION   . DDD (degenerative disc disease)     Cervical spine & knees  . GERD   . DYSLIPIDEMIA   . HYPERTENSION   . HYPOTHYROIDISM   . PEPTIC ULCER DISEASE 11/2009  . Osteoporosis   . CARCINOMA, THYROID GLAND, PAPILLARY 09/2008    s/p  total thyroidectomy  . Glioblastoma   . CKD (chronic kidney disease) stage 2, GFR 60-89 ml/min     Past Surgical History  Procedure Laterality Date  . Cholecystectomy    . Abdominal hysterectomy    . Thyroidectomy  2/30/10    papillary, s/p total  . Back surgery    . Tonsillectomy    . Appendectomy    . Incontinence surgery    . Wrist surgery      LEFT  . Cardiovascular stress test  01/27/2003    NO EVIDENCE OF ISCHEMIA. EF 74%  . US echocardiography  03/16/2009    EF 55-60%  . US echocardiography  01/26/2003    EF 60-65%  . Craniotomy Right 08/13/2013    Procedure: RIGHT FRONTAL CRANIOTOMY FOR RESECTION OF TUMOR;  Surgeon: Consuella Lose, MD;  Location: [facility];  Service: Neurosurgery;  Laterality: Right;  right  . Bladder suspension    . Breast biopsy    . Cardiac catheterization  03/30/2005    NORMAL EF. NORMAL CORONARIES    Mikey College MS, RD, LDN [phone] Pager [phone] After Hours Pager

## 2013-11-26 NOTE — Progress Notes (Signed)
Pt stated that the hospital was "trying to get one over on me by making me stay another day so I will have to pay for it".  I informed her that was not the case and we are looking out for her best interest.  Pt remained agitated and stated that she may just leave.  I told her that would not be prudent or wise to leave against medical advice.  Irven Baltimore, RN

## 2013-11-26 NOTE — Consult Note (Signed)
[place]  [phone]    HOSPITAL PROGRESS NOTE  Patient ID: 80 year old female initially seen in consultation by Dr. Marin Olp on 08/16/13 for Right frontal lobe glioblastoma multiforme as she had been admitted at the time to the hospital with progressive left sided weakness, and a CT of the head and MRI of the brain confirmed a right  parietal-frontal lobe mass, partially necrotic, with peripheral enhancement and surrounding vasogenic edema, and midline shift of 1.1 cm. SHe had resection of the mass in December of 2014.  Pathology report [id number]) showed a glioblastoma that was grade 4. Tumor was sent off for MGMT analysis, negative. At the time, low- dose Temodar with radiation and possibly full dose Temodar post radiation was entertained.  Radiation Oncology recommended, total of 60 Gys, currently undergoing under direction of Dr. Sondra Come. She presented for her radiation treatment on 4/1, with complaints of  Pain in right shoulder and leg following a fall . She also complained of cough, fever up to 101 and dysuria, and mild confusion was noted. She was admitted to the hospital for further workup.CXR was equivocal for active interstitial lung disease. No CT of the head was performed. She was diagnosed with possible UTI requiring antibiotics with Vanco and Cefepime. Cultures pending. We were informed of the patient's admission while her radiation treatments continue. She is full code.  Past Medical History  Diagnosis Date  . CVA 1998    "mini" cva, no residual deficts  . ARTHRITIS   . URINARY INCONTINENCE   . DEPRESSION   . DDD (degenerative disc disease)     Cervical spine & knees  . GERD   . DYSLIPIDEMIA   . HYPERTENSION   . HYPOTHYROIDISM   . PEPTIC ULCER DISEASE 11/2009  . Osteoporosis   . CARCINOMA, THYROID GLAND, PAPILLARY 09/2008    s/p total thyroidectomy  . Glioblastoma   . CKD (chronic kidney disease) stage 2, GFR 60-89 ml/min    Past Surgical  History  Procedure Laterality Date  . Cholecystectomy    . Abdominal hysterectomy    . Thyroidectomy  2/30/10    papillary, s/p total  . Back surgery    . Tonsillectomy    . Appendectomy    . Incontinence surgery    . Wrist surgery      LEFT  . Cardiovascular stress test  01/27/2003    NO EVIDENCE OF ISCHEMIA. EF 74%  . US echocardiography  03/16/2009    EF 55-60%  . US echocardiography  01/26/2003    EF 60-65%  . Craniotomy Right 08/13/2013    Procedure: RIGHT FRONTAL CRANIOTOMY FOR RESECTION OF TUMOR;  Surgeon: Consuella Lose, MD;  Location: [facility];  Service: Neurosurgery;  Laterality: Right;  right  . Bladder suspension    . Breast biopsy    . Cardiac catheterization  03/30/2005    NORMAL EF. NORMAL CORONARIES   Family History  Problem Relation Age of Onset  . Arthritis Mother   . Diabetes Mother   . Hyperlipidemia Mother   . Arthritis Father   . Diabetes Father   . Hyperlipidemia Father   . Breast cancer Maternal Grandmother   . Cancer Sister     breast    MEDICATIONS:  Scheduled Meds: . amitriptyline  25 mg Oral QHS  . aspirin EC  81 mg Oral Daily  . benzonatate  100 mg Oral BID  . ceFEPime (MAXIPIME) IV  1 g Intravenous Q24H  . clopidogrel  75 mg Oral Q breakfast  . docusate sodium  100 mg Oral BID  . levETIRAcetam  500 mg Oral BID  . levothyroxine  100 mcg Oral QAC breakfast  . losartan  50 mg Oral Daily  . pantoprazole  40 mg Oral BID  . polyethylene glycol  17 g Oral Daily  . potassium chloride  20 mEq Oral Daily  . PRUTECT   Topical BID  . simvastatin  20 mg Oral q1800  . vancomycin  1,000 mg Intravenous Q24H   Continuous Infusions: . 0.9 % NaCl with KCl 20 mEq / L 70 mL/hr at 11/25/13 2043   PRN Meds:.acetaminophen, acetaminophen, albuterol, ALPRAZolam, alum & mag hydroxide-simeth, feeding supplement (ENSURE COMPLETE), guaiFENesin-dextromethorphan, indomethacin, methocarbamol, ondansetron (ZOFRAN) IV, ondansetron,  traMADol-acetaminophen   ALLERGIES:   Allergies  Allergen Reactions  . Iodine     REACTION: Nausea   History   Social History  . Marital Status: Widowed    Spouse Name: N/A    Number of Children: 2  . Years of Education: N/A   Occupational History  .     Social History Main Topics  . Smoking status: Never Smoker   . Smokeless tobacco: Never Used     Comment: widowed, lives with daughter "who is an addict" per pt. Retired, enjoying gardening  . Alcohol Use: No  . Drug Use: No  . Sexual Activity: Not on file   Other Topics Concern  . Not on file   Social History Narrative  . No narrative on file    PHYSICAL EXAMINATION:   Filed Vitals:   11/26/13 0549  BP: 107/65  Pulse: 70  Temp: 99 F (37.2 C)  Resp: 18     Filed Weights   11/25/13 1925  Weight: 160 lb 7.9 oz (72.41 kg)    80 year old white femaile, frail,   in no acute distress, conversant, alert and oriented HEENT: Normocephalic, atraumatic, PERRLA. Oral cavity without thrush or lesions. Neck supple. no thyromegaly, no cervical or supraclavicular adenopathy  Lungs trace rales at the bases. No wheezing, rhonchi or rales. Cardiac: regular rate and rhythm,no murmur , rubs or gallops Abdomen soft nontender , bowel sounds x4. No hepatosplenomegaly Extremities no clubbing cyanosis or edema. No  petechial rash Musculoskeletal: tender to palpation at shouders  Neuro: No focal or motor deficits  LABORATORY/RADIOLOGY DATA:   Recent Labs Lab 11/25/13 1746 11/26/13 0534  WBC 10.2 7.3  HGB 13.0 11.4*  HCT 39.8 34.9*  PLT 270 224  MCV 90.5 90.9  MCH 29.5 29.7  MCHC 32.7 32.7  RDW 14.0 14.1  LYMPHSABS 1.1  --   MONOABS 1.0  --   EOSABS 0.1  --   BASOSABS 0.0  --     CMP    Recent Labs Lab 11/25/13 1746 11/26/13 0534  NA 137 140  K 3.6* 4.3  CL 100 108  CO2 22 24  GLUCOSE 95 92  BUN 13 11  CREATININE 1.13* 1.17*  CALCIUM 10.2 8.9  AST 12  --   ALT 10  --   ALKPHOS 81  --   BILITOT  0.7  --         Component Value Date/Time   BILITOT 0.7 11/25/2013 1746   BILIDIR 0.1 08/03/2013 1427      Recent Labs  11/26/13 0534  TSH 24.320*        Component Value Date/Time   ESRSEDRATE 1 06/30/2008 1941     Urinalysis    Component Value Date/Time  COLORURINE AMBER* 11/25/2013 1758   APPEARANCEUR CLOUDY* 11/25/2013 1758   LABSPEC 1.025 11/25/2013 1758   PHURINE 5.5 11/25/2013 1758   GLUCOSEU NEGATIVE 11/25/2013 1758   HGBUR NEGATIVE 11/25/2013 1758   BILIRUBINUR SMALL* 11/25/2013 1758   BILIRUBINUR neg 04/24/2013 [place] 11/25/2013 1758   PROTEINUR 30* 11/25/2013 1758   PROTEINUR trace 04/24/2013 1348   UROBILINOGEN 1.0 11/25/2013 1758   UROBILINOGEN negative 04/24/2013 1348   NITRITE NEGATIVE 11/25/2013 1758   NITRITE neg 04/24/2013 1348   LEUKOCYTESUR SMALL* 11/25/2013 1758    Liver Function Tests:  Recent Labs Lab 11/25/13 1746  AST 12  ALT 10  ALKPHOS 81  BILITOT 0.7  PROT 7.6  ALBUMIN 3.6    Thyroid function studies  Recent Labs  11/26/13 0534  TSH 24.320*    Radiology Studies:  Dg Chest 2 View  11/25/2013   CLINICAL DATA:  Fever and cough.  EXAM: CHEST  2 VIEW  COMPARISON:  CT CHEST W/O CM dated 08/11/2013; DG CHEST 2 VIEW dated 07/18/2013  FINDINGS: Mediastinum and hilar structures are normal. Mild infiltrate in the right and left upper lobes cannot be excluded. Underlying nodular densities in this region cannot be excluded. These findings may just represent overlapping structures. Followup and lateral chest x-rays are suggested. Pleural thickening is again noted. No pleural effusion or pneumothorax. Diffuse interstitial prominence is present, more prominent than prior study of 07/18/2013 suggest a the possibility of active pulmonary interstitial lung disease including pneumonitis. Again follow-up chest x-ray suggested. Borderline cardiomegaly, no pulmonary venous congestion. No acute bony abnormality. Surgical clips right upper quadrant.   IMPRESSION: 1. Cannot exclude mild infiltration upper lobes. These changes may just represent overlapping shadows. Follow-up PA and lateral chest x-rays suggested. 2. Increased interstitial markings bilaterally when compared to prior chest x-ray of 07/18/2013 and prior chest CT of 08/11/2013. Active interstitial lung disease cannot be excluded and follow-up chest x-rays suggested.   Electronically Signed   By: Marcello Moores  Register   On: 11/25/2013 17:32   Dg Chest Port 1 View  11/26/2013   CLINICAL DATA:  Shortness of breath and fever.  EXAM: PORTABLE CHEST - 1 VIEW  COMPARISON:  DG CHEST 2 VIEW dated 11/25/2013  FINDINGS: Mediastinum and hilar structures are normal. Cardiomegaly with normal pulmonary vascularity. Progressive infiltrate right upper lobe consistent with pneumonia noted. Mild basilar atelectasis. Questionable infiltrate left upper lobe has cleared. No pleural effusion or pneumothorax. Degenerative changes lumbar spine.  IMPRESSION: Progressive pulmonary infiltrate right upper lobe consistent with pneumonia.   Electronically Signed   By: Marcello Moores  Register   On: 11/26/2013 10:06       ASSESSMENT AND PLAN:  [age] year old with diagnosis of GBM, s/p resection in December 2014, now undergoing radiation. There are no plans for Temodar as her molecular analysis of her tumor showed that she was MGMT -methylation NEGATIVE. As such, these tumors certainly do not have any improved survival with chemotherapy.  2. Fall with bilateral pain in shoulder and legs. As per admitting team.   3. DVT prophylaxis with Lovenox  4. Full Code  5. UTI on Vanco and Cefepime. Cultures pending  Other medical issues   as per admitting team.  She really needs to have continued physical therapy if possible. I know that there have been issues with the family. Am not sure exactly what has been going on with the family. I think that she has probably missed some radiation treatments.  From my point of view,  there is not  much for [place] to do with this admission. I have tried to get her to see [place] as an outpatient. Her performance have always been canceled. That she is making her radiation appointments.  At some point in the future, she is going to have to be evaluated. Again, we made appointments for her which she has not kept.  It was nice to see her. I did examine her. I will continue to pray for her.  Pete e.      [facility] E, PA-C 11/26/2013, 11:36 AM

## 2013-11-26 NOTE — Progress Notes (Signed)
Chaplain offered spiritual care and support to the patient. Chaplain listened attentively as the patient shared her narrative. Patient said "she is ready to go home." chaplain offered emotional support to patient.   11/26/13 1700  Clinical Encounter Type  Visited With Patient;Health care provider  Visit Type Initial;Spiritual support;Social support

## 2013-11-26 NOTE — Evaluation (Signed)
I have reviewed this note and agree with all findings. Kati Murl Zogg, PT, DPT Pager: [phone]   

## 2013-11-26 NOTE — Progress Notes (Signed)
[facility] Therapy Treatment Record Phone [phone]   Radiation Therapy was administered to [name] on: 11/26/2013  2:11 PM and was treatment # 20 out of a planned course of 30 treatments.

## 2013-11-26 NOTE — Progress Notes (Signed)
[facility] PROGRESS NOTE  [name] XNA:[id number] DOB: 09/22/1927 DOA: 11/25/2013 PCP: [name], MD  Brief narrative: 80 y.o. female with a past medical history of glioblastoma multiforme, status post resection of the mass in 07/2013, undergoing radiation therapy, thyroid cancer and status post total thyroidectomy in 2010, hypertension, and hypothyroidism who presented to [facility] ED 11/25/2013 with fevers, chills and cough for past 24 hours prior to this admission. Cough was productive of green sputum. She also had shortness of breath. In ED, Tmax was 100.26F. Her blood work revealed normal white blood cell count of 10.2, slightly low potassium of 3.6, and creatinine 1.13. Her chest x-ray reveals possible mild infiltration of the upper lobes and increased interstitial markings bilaterally compared to the chest x-ray on 07/18/13. Urinalysis showed leukocytes and many bacteria. She was started empirically on vanco and cefepime.  Assessment/Plan:  Principal Problem: Acute respiratory failure with hypoxia - likely secondary to pneumonia, HCAP - pt had repeat CXR this am which confirmed progressive right upper lobe infiltrate consistent with pneumonia - I do not see blood cultures collected on the admission and she already started abx so will defer getting blood cultures unless she spikes fever again - continue vancomycin and cefepime - tessalon PRN cough - albuterol neb every 2 hours PRN  Active Problems: UTI - on vanco and cefepime - follow urine culture results Glioblastoma multiforme - the patient is actively undergoing radiation therapy by Dr. Sondra Come.  - appreciate oncology following - continue Keppra 500 mg PO BID - for ?mental status changes we asked psych to evaluate for capacity  Chronic kidney disease - creatinine at baseline, 1.13 Hypothyroidism - TSH 24 on this admission with low T4 0.67 - currently on synthroid 100 mcg daily - may increase synthroid to 125 mcg  daily; TSH repet required in 3 months Dyslipidemia - continue statin therapy Hypertension - continue losartan  CAD - continue aspirin and plavix   Code Status: full code Family Communication: no family at the bedside  Disposition Plan: home when stable  [name], MD  [facility] Pager [phone]  If 7PM-7AM, please contact night-coverage [email or web address] Password [email or web address] 11/26/2013, 6:48 AM   LOS: 1 day   Consultants:  Oncology (Dr. Marin Olp)  Psychiatry for capacity 4/2  Procedures:  RT  Antibiotics:  Vancomycin and cefepime 11/25/2013 -->  HPI/Subjective: Sitting in bed eating breakfast, says she has to go to the court.   Objective: Filed Vitals:   11/25/13 1916 11/25/13 1925 11/25/13 2025 11/26/13 0549  BP: 121/53 131/60 127/68 107/65  Pulse: 76 76 72 70  Temp: 99.6 F (37.6 C) 100.6 F (38.1 C) 98.6 F (37 C) 99 F (37.2 C)  TempSrc: Oral Oral Oral Oral  Resp: 17 16 18 18   Height:  5' 4.17" (1.63 m)    Weight:  72.8 kg (160 lb 7.9 oz)    SpO2: 92% 95% 93% 92%    Intake/Output Summary (Last 24 hours) at 11/26/13 0648 Last data filed at 11/26/13 0600  Gross per 24 hour  Intake 1539.83 ml  Output      0 ml  Net 1539.83 ml    Exam:   General:  Pt is alert, no acute distress  Cardiovascular: Regular rate and rhythm, S1/S2, no murmurs, no rubs, no gallops  Respiratory: Clear to auscultation bilaterally, no wheezing  Abdomen: Soft, non tender, non distended, bowel sounds present, no guarding  Extremities: No edema, pulses DP and PT palpable bilaterally  Neuro: Grossly nonfocal  Data  Reviewed: Basic Metabolic Panel:  Recent Labs Lab 11/25/13 1746 11/26/13 0534  NA 137 140  K 3.6* 4.3  CL 100 108  CO2 22 24  GLUCOSE 95 92  BUN 13 11  CREATININE 1.13* 1.17*  CALCIUM 10.2 8.9   Liver Function Tests:  Recent Labs Lab 11/25/13 1746  AST 12  ALT 10  ALKPHOS 81  BILITOT 0.7  PROT 7.6  ALBUMIN 3.6   No results found for  this basename: LIPASE, AMYLASE,  in the last 168 hours No results found for this basename: AMMONIA,  in the last 168 hours CBC:  Recent Labs Lab 11/25/13 1746 11/26/13 0534  WBC 10.2 7.3  NEUTROABS 8.1*  --   HGB 13.0 11.4*  HCT 39.8 34.9*  MCV 90.5 90.9  PLT 270 224   Cardiac Enzymes: No results found for this basename: CKTOTAL, CKMB, CKMBINDEX, TROPONINI,  in the last 168 hours BNP: No components found with this basename: POCBNP,  CBG: No results found for this basename: GLUCAP,  in the last 168 hours  No results found for this or any previous visit (from the past 240 hour(s)).   Studies: Dg Chest 2 View 11/25/2013    IMPRESSION: 1. Cannot exclude mild infiltration upper lobes. These changes may just represent overlapping shadows. Follow-up PA and lateral chest x-rays suggested. 2. Increased interstitial markings bilaterally when compared to prior chest x-ray of 07/18/2013 and prior chest CT of 08/11/2013. Active interstitial lung disease cannot be excluded and follow-up chest x-rays suggested.     Scheduled Meds: . amitriptyline  25 mg Oral QHS  . aspirin EC  81 mg Oral Daily  . benzonatate  100 mg Oral BID  . ceFEPime (MAXIPIME) IV  1 g Intravenous Q24H  . clopidogrel  75 mg Oral Q breakfast  . docusate sodium  100 mg Oral BID  . levETIRAcetam  500 mg Oral BID  . levothyroxine  100 mcg Oral QAC breakfast  . losartan  50 mg Oral Daily  . pantoprazole  40 mg Oral BID  . polyethylene glycol  17 g Oral Daily  . potassium chloride  20 mEq Oral Daily  . PRUTECT   Topical BID  . simvastatin  20 mg Oral q1800  . vancomycin  1,000 mg Intravenous Q24H   Continuous Infusions: . 0.9 % NaCl with KCl 20 mEq / L 70 mL/hr at 11/25/13 2043

## 2013-11-26 NOTE — Consult Note (Signed)
Reason for Consult: Capacity evaluation Referring Physician: Robbie Lis, MD  [name] is an 80 y.o. female.  HPI: Patient was seen and chart reviewed. Patient has been suffering with multiple medical problems, being admitted to [facility] long medical floor because her daughter left her when she came for the radiation therapy for brain cancer. Patient reported her [facility] utility bill was not paid while she was in the rehabilitation center. Patient daughter has been in and out of the house, occasionally helping her and at the same time questionable taking her money away and not using them appropriately. Patient stated she needed to go home draw money from the bank account and pay for utility bills. Patient has no family members at the bedside. Patient seems to be with intact cognitions and memory. Patient has been knowledgeable about her medical conditions and required treatment. Daughter never came back after bringing Pt to her radiation appointment yesterday and has not been a reliable caregiver and clinical social work contacted Adult Scientist, forensic.   Medical history: 80 y.o. female with a past medical history of glioblastoma multiforme, status post resection of the mass in 07/2013, undergoing radiation therapy, thyroid cancer and status post total thyroidectomy in 2010, hypertension, and hypothyroidism who presented to [facility] ED 11/25/2013 with fevers, chills and cough for past 24 hours prior to this admission. Cough was productive of green sputum. She also had shortness of breath.  In ED, Tmax was 100.49F. Her blood work revealed normal white blood cell count of 10.2, slightly low potassium of 3.6, and creatinine 1.13. Her chest x-ray reveals possible mild infiltration of the upper lobes and increased interstitial markings bilaterally compared to the chest x-ray on 07/18/13. Urinalysis showed leukocytes and many bacteria. She was started empirically on vanco and cefepime  Mental Status  Examination: Patient appeared as per his stated age, fairly dressed, and groomed, and maintaining good eye contact. Patient has good mood and affect was anxious. She has normal rate, rhythm, and volume of speech. Her thought process is linear and goal directed. Patient has denied suicidal, homicidal ideations, intentions or plans. Patient has no evidence of auditory or visual hallucinations, delusions, and paranoia. Patient has fair insight judgment and impulse control.  Past Medical History  Diagnosis Date  . CVA 1998    "mini" cva, no residual deficts  . ARTHRITIS   . URINARY INCONTINENCE   . DEPRESSION   . DDD (degenerative disc disease)     Cervical spine & knees  . GERD   . DYSLIPIDEMIA   . HYPERTENSION   . HYPOTHYROIDISM   . PEPTIC ULCER DISEASE 11/2009  . Osteoporosis   . CARCINOMA, THYROID GLAND, PAPILLARY 09/2008    s/p total thyroidectomy  . Glioblastoma   . CKD (chronic kidney disease) stage 2, GFR 60-89 ml/min     Past Surgical History  Procedure Laterality Date  . Cholecystectomy    . Abdominal hysterectomy    . Thyroidectomy  2/30/10    papillary, s/p total  . Back surgery    . Tonsillectomy    . Appendectomy    . Incontinence surgery    . Wrist surgery      LEFT  . Cardiovascular stress test  01/27/2003    NO EVIDENCE OF ISCHEMIA. EF 74%  . US echocardiography  03/16/2009    EF 55-60%  . US echocardiography  01/26/2003    EF 60-65%  . Craniotomy Right 08/13/2013    Procedure: RIGHT FRONTAL CRANIOTOMY FOR RESECTION OF TUMOR;  Surgeon: Consuella Lose, MD;  Location: [place] [facility];  Service: Neurosurgery;  Laterality: Right;  right  . Bladder suspension    . Breast biopsy    . Cardiac catheterization  03/30/2005    NORMAL EF. NORMAL CORONARIES    Family History  Problem Relation Age of Onset  . Arthritis Mother   . Diabetes Mother   . Hyperlipidemia Mother   . Arthritis Father   . Diabetes Father   . Hyperlipidemia Father   . Breast cancer Maternal  Grandmother   . Cancer Sister     breast    Social History:  reports that she has never smoked. She has never used smokeless tobacco. She reports that she does not drink alcohol or use illicit drugs.  Allergies:  Allergies  Allergen Reactions  . Iodine     REACTION: Nausea    Medications: I have reviewed the patient's current medications.  Results for orders placed during the hospital encounter of 11/25/13 (from the past 48 hour(s))  CBC WITH DIFFERENTIAL     Status: Abnormal   Collection Time    11/25/13  5:46 PM      Result Value Ref Range   WBC 10.2  4.0 - 10.5 K/uL   RBC 4.40  3.87 - 5.11 MIL/uL   Hemoglobin 13.0  12.0 - 15.0 g/dL   HCT 39.8  36.0 - 46.0 %   MCV 90.5  78.0 - 100.0 fL   MCH 29.5  26.0 - 34.0 pg   MCHC 32.7  30.0 - 36.0 g/dL   RDW 14.0  11.5 - 15.5 %   Platelets 270  150 - 400 K/uL   Neutrophils Relative % 79 (*) 43 - 77 %   Neutro Abs 8.1 (*) 1.7 - 7.7 K/uL   Lymphocytes Relative 10 (*) 12 - 46 %   Lymphs Abs 1.1  0.7 - 4.0 K/uL   Monocytes Relative 10  3 - 12 %   Monocytes Absolute 1.0  0.1 - 1.0 K/uL   Eosinophils Relative 1  0 - 5 %   Eosinophils Absolute 0.1  0.0 - 0.7 K/uL   Basophils Relative 0  0 - 1 %   Basophils Absolute 0.0  0.0 - 0.1 K/uL  COMPREHENSIVE METABOLIC PANEL     Status: Abnormal   Collection Time    11/25/13  5:46 PM      Result Value Ref Range   Sodium 137  137 - 147 mEq/L   Potassium 3.6 (*) 3.7 - 5.3 mEq/L   Chloride 100  96 - 112 mEq/L   CO2 22  19 - 32 mEq/L   Glucose, Bld 95  70 - 99 mg/dL   BUN 13  6 - 23 mg/dL   Creatinine, Ser 1.13 (*) 0.50 - 1.10 mg/dL   Calcium 10.2  8.4 - 10.5 mg/dL   Total Protein 7.6  6.0 - 8.3 g/dL   Albumin 3.6  3.5 - 5.2 g/dL   AST 12  0 - 37 U/L   ALT 10  0 - 35 U/L   Alkaline Phosphatase 81  39 - 117 U/L   Total Bilirubin 0.7  0.3 - 1.2 mg/dL   GFR calc non Af Amer 44 (*) >90 mL/min   GFR calc Af Amer 51 (*) >90 mL/min   Comment: (NOTE)     The eGFR has been calculated using the  CKD EPI equation.     This calculation has not been validated in all clinical situations.  eGFR's persistently <90 mL/min signify possible Chronic Kidney     Disease.  URINALYSIS, ROUTINE W REFLEX MICROSCOPIC     Status: Abnormal   Collection Time    11/25/13  5:58 PM      Result Value Ref Range   Color, Urine AMBER (*) YELLOW   Comment: BIOCHEMICALS MAY BE AFFECTED BY COLOR   APPearance CLOUDY (*) CLEAR   Specific Gravity, Urine 1.025  1.005 - 1.030   pH 5.5  5.0 - 8.0   Glucose, UA NEGATIVE  NEGATIVE mg/dL   Hgb urine dipstick NEGATIVE  NEGATIVE   Bilirubin Urine SMALL (*) NEGATIVE   Ketones, ur NEGATIVE  NEGATIVE mg/dL   Protein, ur 30 (*) NEGATIVE mg/dL   Urobilinogen, UA 1.0  0.0 - 1.0 mg/dL   Nitrite NEGATIVE  NEGATIVE   Leukocytes, UA SMALL (*) NEGATIVE  URINE MICROSCOPIC-ADD ON     Status: Abnormal   Collection Time    11/25/13  5:58 PM      Result Value Ref Range   Squamous Epithelial / LPF MANY (*) RARE   WBC, UA 7-10  <3 WBC/hpf   RBC / HPF 0-2  <3 RBC/hpf   Bacteria, UA MANY (*) RARE   Urine-Other MUCOUS PRESENT    BASIC METABOLIC PANEL     Status: Abnormal   Collection Time    11/26/13  5:34 AM      Result Value Ref Range   Sodium 140  137 - 147 mEq/L   Potassium 4.3  3.7 - 5.3 mEq/L   Chloride 108  96 - 112 mEq/L   CO2 24  19 - 32 mEq/L   Glucose, Bld 92  70 - 99 mg/dL   BUN 11  6 - 23 mg/dL   Creatinine, Ser 1.17 (*) 0.50 - 1.10 mg/dL   Calcium 8.9  8.4 - 10.5 mg/dL   GFR calc non Af Amer 42 (*) >90 mL/min   GFR calc Af Amer 49 (*) >90 mL/min   Comment: (NOTE)     The eGFR has been calculated using the CKD EPI equation.     This calculation has not been validated in all clinical situations.     eGFR's persistently <90 mL/min signify possible Chronic Kidney     Disease.  CBC     Status: Abnormal   Collection Time    11/26/13  5:34 AM      Result Value Ref Range   WBC 7.3  4.0 - 10.5 K/uL   RBC 3.84 (*) 3.87 - 5.11 MIL/uL   Hemoglobin 11.4 (*)  12.0 - 15.0 g/dL   HCT 34.9 (*) 36.0 - 46.0 %   MCV 90.9  78.0 - 100.0 fL   MCH 29.7  26.0 - 34.0 pg   MCHC 32.7  30.0 - 36.0 g/dL   RDW 14.1  11.5 - 15.5 %   Platelets 224  150 - 400 K/uL  TSH     Status: Abnormal   Collection Time    11/26/13  5:34 AM      Result Value Ref Range   TSH 24.320 (*) 0.350 - 4.500 uIU/mL   Comment: Please note change in reference range.     Performed at [facility]  T4, FREE     Status: Abnormal   Collection Time    11/26/13  5:34 AM      Result Value Ref Range   Free T4 0.67 (*) 0.80 - 1.80 ng/dL   Comment: Performed at  [facility]    Dg Chest 2 View  11/25/2013   CLINICAL DATA:  Fever and cough.  EXAM: CHEST  2 VIEW  COMPARISON:  CT CHEST W/O CM dated 08/11/2013; DG CHEST 2 VIEW dated 07/18/2013  FINDINGS: Mediastinum and hilar structures are normal. Mild infiltrate in the right and left upper lobes cannot be excluded. Underlying nodular densities in this region cannot be excluded. These findings may just represent overlapping structures. Followup and lateral chest x-rays are suggested. Pleural thickening is again noted. No pleural effusion or pneumothorax. Diffuse interstitial prominence is present, more prominent than prior study of 07/18/2013 suggest a the possibility of active pulmonary interstitial lung disease including pneumonitis. Again follow-up chest x-ray suggested. Borderline cardiomegaly, no pulmonary venous congestion. No acute bony abnormality. Surgical clips right upper quadrant.  IMPRESSION: 1. Cannot exclude mild infiltration upper lobes. These changes may just represent overlapping shadows. Follow-up PA and lateral chest x-rays suggested. 2. Increased interstitial markings bilaterally when compared to prior chest x-ray of 07/18/2013 and prior chest CT of 08/11/2013. Active interstitial lung disease cannot be excluded and follow-up chest x-rays suggested.   Electronically Signed   By: Marcello Moores  Register   On: 11/25/2013 17:32    Dg Chest Port 1 View  11/26/2013   CLINICAL DATA:  Shortness of breath and fever.  EXAM: PORTABLE CHEST - 1 VIEW  COMPARISON:  DG CHEST 2 VIEW dated 11/25/2013  FINDINGS: Mediastinum and hilar structures are normal. Cardiomegaly with normal pulmonary vascularity. Progressive infiltrate right upper lobe consistent with pneumonia noted. Mild basilar atelectasis. Questionable infiltrate left upper lobe has cleared. No pleural effusion or pneumothorax. Degenerative changes lumbar spine.  IMPRESSION: Progressive pulmonary infiltrate right upper lobe consistent with pneumonia.   Electronically Signed   By: Marcello Moores  Register   On: 11/26/2013 10:06    Positive for anxiety and depression Blood pressure 119/68, pulse 68, temperature 98.7 F (37.1 C), temperature source Oral, resp. rate 18, height 5' 4.17" (1.63 m), weight 72.8 kg (160 lb 7.9 oz), SpO2 92.00%.   Assessment/Plan: Mood disorder not otherwise specified  Recommendation:  Recommended no medication management Patient does meet criteria for capacity to make her own medical decisions and living arrangements. Appreciate psychiatric consultation and will sign off at this time May contact [phone] if needed further assistance in this case.  Kylen Schliep,JANARDHAHA R. 11/26/2013, 5:25 PM

## 2013-11-26 NOTE — Progress Notes (Signed)
Pt is agitated about not yet being discharged.  Pt spoke with MD at 1500 and is unhappy that he has not yet discharged her.  I tried to hang a bag of fluid and pt refused the fluid and told me "I better not hang it".  I spoke with pt about her future plans to try and redirect her anxiety.  Irven Baltimore, RN

## 2013-11-26 NOTE — Clinical Social Work Note (Signed)
[place] Clinical Social Work  Clinical Social Work at the [facility] has been following this pt since January. Pt has a very complex social situation and Adult Protective Services has been following with an open case since September 18, 2013. APS worker is Ulice Bold and her number is [phone]. This CSW contacted APS today and informed the worker that pt was admitted last night. CSW informed APS that daughter, Mickel Baas left her at [facility] last night. Pt was supposed to have a court date last week and failed to appear due to forgetfulness and mixing up the day. Pt has issues with transportation and has missed many of her radiation appointments. APS and CSW has had to arrange transportation for pt as no family will agree to take her to appointments or to get medications filled. Her daughter suffers from PTSD and leaves her mother alone for days at a time and most recently has taken her car. She has another daughter, Asencion Partridge that has changed her phone number and will not provide it to her mother. Pt has had issues with her utilities and has had her phone cut off in the recent past due to inability to manage her bills and finances.   There appear to be no family members capable of providing pt with adequate care and pt herself expressed last night she may be open to returning to a SNF or ALF. She checked out of [employer] in January because she was worried how her daughter was doing in Pt's home alone. She waited two days for her daughter to come pick her up. Her daughter does not act in Pt's best interest and is probably not a reliable Media planner. APS has been working closely with this CSW to try to develop a safe plan, but due to delayed court date this has extended the process.   This CSW will update unit CSW and follow closely.   [name], LCSW Clinical Social Worker [facility] for [place] Wednesday, Thursday and Friday Phone: [phone] Fax: [phone]

## 2013-11-26 NOTE — Evaluation (Signed)
Physical Therapy Evaluation Patient Details Name: [name] MRN: [id number] DOB: 05-21-1928 Today's Date: 11/26/2013   History of Present Illness  Pt is a 80 y.o. female presented with a chief complaint of fever, chills, and cough, diagnosed with UTI; PMH of glioblastoma, undergoing radiation therapy, hypertension, and hypothyroidism  Clinical Impression  Pt admitted with UTI, currently undergoing radiation therapy for glioblastoma.  Pt currently with functional limitations due to the deficits listed below (see PT Problem List).  Pt ambulated in hallway with rolling walker and self reported unsteady on feet, however no LOB.  Pt has a history of falls, unable to recall if cause of recent fall was obstacles at home or dizziness following radiation therapy treatments.  Recommend supervision for mobility due to history of falls.  Per Education officer, museum note, seeking facility placement however, no further rehab needs following discharge.  Pt will benefit from skilled PT to increase their independence and safety with mobility to allow discharge.    Follow Up Recommendations No PT follow up    Equipment Recommendations  None recommended by PT    Recommendations for Other Services       Precautions / Restrictions Precautions Precautions: Fall Precaution Comments: recent fall last week, unable to recall why she fell but was walking in a tight space with several objects on the floor,  reports she may have gotten dizzy; reports intense dizziness follwoing radiation treatments Restrictions Weight Bearing Restrictions: No      Mobility  Bed Mobility Overal bed mobility: Needs Assistance Bed Mobility: Supine to Sit     Supine to sit: Supervision;Min guard     General bed mobility comments: verbal cues for safety; asked for a hand to pull on to bring trunk upright  Transfers Overall transfer level: Needs assistance Equipment used: Rolling walker (2 wheeled) Transfers: Sit to/from  Stand Sit to Stand: Min guard         General transfer comment: verbal cues for safety  Ambulation/Gait Ambulation/Gait assistance: Min guard Ambulation Distance (Feet): 100 Feet Assistive device: Rolling walker (2 wheeled) Gait Pattern/deviations: WFL(Within Functional Limits)     General Gait Details: verbal cues for safety and sequence; pt tripped over foot slightly x2 but had no LOB and self-recovered  Stairs            Wheelchair Mobility    Modified Rankin (Stroke Patients Only)       Balance                                             Pertinent Vitals/Pain No complaints of pain.  Activity to tolerance.    Home Living Family/patient expects to be discharged to:: Private residence Living Arrangements: Alone Available Help at Discharge: Family (daughter lives close) Type of Home: House Home Access: Stairs to enter Entrance Stairs-Rails: Psychiatric nurse of Steps: [place]: Multi-level;Able to live on main level with bedroom/bathroom Home Equipment: Walker - 4 wheels;Walker - standard;Cane - single point;Walker - 2 wheels;Bedside commode;Tub bench Additional Comments: pt may be poor historian, unsure of accuracy of home equipment    Prior Function Level of Independence: Independent with assistive device(s)         Comments: Pt does own cooking and cleaning, uses walker     Hand Dominance   Dominant Hand: Right    Extremity/Trunk Assessment   Upper Extremity Assessment: Defer to  OT evaluation           Lower Extremity Assessment: Overall WFL for tasks assessed      Cervical / Trunk Assessment: Normal  Communication   Communication: No difficulties  Cognition Arousal/Alertness: Awake/alert Behavior During Therapy: WFL for tasks assessed/performed Overall Cognitive Status: No family/caregiver present to determine baseline cognitive functioning                      General Comments       Exercises        Assessment/Plan    PT Assessment Patient needs continued PT services  PT Diagnosis Difficulty walking   PT Problem List Decreased strength;Decreased mobility;Decreased safety awareness;Decreased knowledge of use of DME  PT Treatment Interventions DME instruction;Gait training;Stair training;Functional mobility training;Therapeutic activities;Therapeutic exercise;Patient/family education   PT Goals (Current goals can be found in the Care Plan section) Acute Rehab PT Goals Patient Stated Goal: return to home, be able to go to appointments  PT Goal Formulation: With patient Time For Goal Achievement: 12/03/13 Potential to Achieve Goals: Good    Frequency Min 3X/week   Barriers to discharge Decreased caregiver support per chart review    Co-evaluation               End of Session Equipment Utilized During Treatment: Gait belt Activity Tolerance: Patient tolerated treatment well Patient left: in chair;with call bell/phone within reach;with chair alarm set           Time: 1118-1130 PT Time Calculation (min): 12 min   Charges:   PT Evaluation $Initial PT Evaluation Tier I: 1 Procedure PT Treatments $Gait Training: 8-22 mins   PT G Codes:          Jacqulyn Cane 11/26/2013, 1:13 PM Jacqulyn Cane SPT 11/26/2013

## 2013-11-26 NOTE — Clinical Social Work Note (Signed)
[place] Work  Holiday representative from [employer] came by to check on pt in her room at [employer]. Pt is usually very open to seeing CSW and asking for assistance. Pt very short with CSW today, asking to go home, saying she was discharged and the man doctor said she could leave. Pt expressed she was very worried about getting to the bank and taking care of her light bill. CSW asked if her family had come to see her as pt was observed in same clothes as yesterday. CSW attempted to talk with her about the benefits of being here; food, transportation to her radiation and getting her medications straight. Pt was not interested and said her daughter was going to pick her up soon. CSW left vm with APS and awaits plan for pt.   Daughter never came back after bringing Pt to her radiation appointment yesterday and has not been a reliable caregiver.   [name], LCSW Clinical Social Worker [facility] for [place] Wednesday, Thursday and Friday Phone: [phone] Fax: [phone]

## 2013-11-27 ENCOUNTER — Ambulatory Visit: Payer: Medicare Other

## 2013-11-27 ENCOUNTER — Ambulatory Visit
Admission: RE | Admit: 2013-11-27 | Discharge: 2013-11-27 | Disposition: A | Discharge status: Home / Self Care | Payer: Medicare Other | Source: Ambulatory Visit | Attending: Radiation Oncology | Admitting: Radiation Oncology

## 2013-11-27 DIAGNOSIS — C73 Malignant neoplasm of thyroid gland: Secondary | ICD-10-CM

## 2013-11-27 DIAGNOSIS — C711 Malignant neoplasm of frontal lobe: Secondary | ICD-10-CM

## 2013-11-27 DIAGNOSIS — F341 Dysthymic disorder: Secondary | ICD-10-CM

## 2013-11-27 DIAGNOSIS — Z9181 History of falling: Secondary | ICD-10-CM

## 2013-11-27 LAB — URINE CULTURE
CULTURE: NO GROWTH
Colony Count: NO GROWTH

## 2013-11-27 MED ORDER — BENZONATATE 100 MG PO CAPS: 100.0000 mg | ORAL_CAPSULE | Freq: Two times a day (BID) | ORAL | Status: DC | PRN

## 2013-11-27 MED ORDER — ALUM & MAG HYDROXIDE-SIMETH 200-200-20 MG/5ML PO SUSP: 30.0000 mL | Freq: Four times a day (QID) | ORAL | Status: DC | PRN

## 2013-11-27 MED ORDER — CIPROFLOXACIN HCL 750 MG PO TABS: 750.0000 mg | ORAL_TABLET | Freq: Two times a day (BID) | ORAL | Status: DC

## 2013-11-27 MED ORDER — LEVOFLOXACIN 750 MG PO TABS: 750.0000 mg | ORAL_TABLET | Freq: Every day | ORAL | Status: DC

## 2013-11-27 MED ORDER — TRAMADOL-ACETAMINOPHEN 37.5-325 MG PO TABS: 1.0000 | ORAL_TABLET | Freq: Four times a day (QID) | ORAL | Status: DC | PRN

## 2013-11-27 MED ORDER — LEVOFLOXACIN 750 MG PO TABS
750.0000 mg | ORAL_TABLET | ORAL | Status: DC
  Administered 2013-11-27: 750 mg via ORAL
  Filled 2013-11-27: qty 1

## 2013-11-27 MED ORDER — ONDANSETRON HCL 4 MG PO TABS: 4.0000 mg | ORAL_TABLET | Freq: Four times a day (QID) | ORAL | Status: DC | PRN

## 2013-11-27 MED ORDER — LEVOTHYROXINE SODIUM 125 MCG PO TABS: 125.0000 ug | ORAL_TABLET | Freq: Every day | ORAL | Status: DC

## 2013-11-27 NOTE — Discharge Instructions (Signed)

## 2013-11-27 NOTE — Discharge Summary (Addendum)
Physician Discharge Summary  [name]:[id number] DOB: [birth year] DOA: 11/25/2013  PCP: Kirsten Grant, MD  Admit date: 11/25/2013 Discharge date: 11/27/2013  Recommendations for Outpatient Follow-up:  Take Levaquin for 5 doses for pnemonia Please have your PCP repeat chest x ray to make sure pneumonia is resolving. Please follow up with radiation in regards to continuing radiation treatments.  Discharge Diagnoses:  Principal Problem:   Urinary tract infection Active Problems:   HYPOTHYROIDISM   Glioblastoma multiforme of brain   HCAP (healthcare-associated pneumonia)   Generalized weakness   CKD (chronic kidney disease) stage 2, GFR 60-89 ml/min   HTN (hypertension)   Anxiety and depression   Right shoulder pain   Protein-calorie malnutrition, severe    Discharge Condition: medically stable for discharge home today  Diet recommendation: as tolerated  History of present illness:  80 y.o. female with a past medical history of glioblastoma multiforme, status post resection of the mass in 07/2013, undergoing radiation therapy, thyroid cancer and status post total thyroidectomy in 2010, hypertension, and hypothyroidism who presented to [facility] ED 11/25/2013 with fevers, chills and cough for past 24 hours prior to this admission. Cough was productive of green sputum. She also had shortness of breath.  In ED, Tmax was 100.16F. Her blood work revealed normal white blood cell count of 10.2, slightly low potassium of 3.6, and creatinine 1.13. Her chest x-ray reveals possible mild infiltration of the upper lobes and increased interstitial markings bilaterally compared to the chest x-ray on 07/18/13. Urinalysis showed leukocytes and many bacteria. She was started empirically on vanco and cefepime.   Assessment/Plan:   Principal Problem:  Acute respiratory failure with hypoxia  - likely secondary to pneumonia, HCAP  - pt had repeat CXR 4/2 which confirmed progressive right upper lobe  infiltrate consistent with pneumonia  - I do not see blood cultures collected on the admission and she already started abx so will defer getting blood cultures. She did not spike a fever - continue vancomycin and cefepime in hospital; Levaquin on discharge for 5 doses - tessalon PRN cough   Active Problems:  UTI  - on vanco and cefepime; urine culture shows no growth. Levaquin for pneumonia on discharge   Glioblastoma multiforme  - the patient is actively undergoing radiation therapy by Dr. Sondra Come.  - continue Keppra 500 mg PO BID  - for ?mental status changes we asked psych to evaluate for capacity; psych said she has capacity regarding her medical decisions and living arrangements  Chronic kidney disease  - creatinine at baseline, 1.13  Hypothyroidism  - TSH 24 on this admission with low T4 0.67  - currently on synthroid 100 mcg daily which we increased to 125 mcg daily; TSH repeat required in 3 months  Dyslipidemia  - continue statin therapy  Hypertension  - continue losartan  CAD  - continue aspirin and plavix   Code Status: full code  Family Communication: no family at the bedside   Consultants:  Oncology (Dr. Marin Olp)  Psychiatry for capacity 4/2 Procedures:  RT Antibiotics:  Vancomycin and cefepime 11/25/2013 --> 4/3 Levaquin for pneumonia   Signed:  [name], MD  [facility] 11/27/2013, 12:19 PM  Pager #: [phone]   Discharge Exam: Filed Vitals:   11/27/13 0556  BP: 102/65  Pulse: 62  Temp: 97.9 F (36.6 C)  Resp: 18   Filed Vitals:   11/26/13 0549 11/26/13 1332 11/26/13 2100 11/27/13 0556  BP: 107/65 119/68 122/54 102/65  Pulse: 70 68 76 62  Temp: 99 F (37.2 C) 98.7 F (37.1 C) 99.5 F (37.5 C) 97.9 F (36.6 C)  TempSrc: Oral Oral Oral Oral  Resp: 18 18 18 18   Height:      Weight:      SpO2: 92% 92% 95% 92%    General: Pt is alert, follows commands appropriately, not in acute distress Cardiovascular: Regular rate and rhythm,  S1/S2 +, no murmurs, no rubs, no gallops Respiratory: Clear to auscultation bilaterally, no wheezing, no crackles, no rhonchi Abdominal: Soft, non tender, non distended, bowel sounds +, no guarding Extremities: no edema, no cyanosis, pulses palpable bilaterally DP and PT Neuro: Grossly nonfocal  Discharge Instructions  Discharge Orders   Future Appointments Provider Department Dept Phone   11/30/2013 1:55 PM [facility] [phone]   12/01/2013 1:55 PM [facility] [phone]   12/02/2013 2:50 PM [facility] [phone]   12/02/2013 3:15 PM Karie Mainland, [facility] [phone]   12/03/2013 3:55 PM [facility] [phone]   12/04/2013 2:10 PM [facility] [phone]   12/07/2013 1:55 PM [facility] [phone]   12/08/2013 1:50 PM [facility] [phone]   12/09/2013 1:55 PM [facility] [phone]   12/09/2013 2:50 PM [facility] [phone]   Future Orders Complete By Expires   Call MD for:  difficulty breathing, headache or visual disturbances  As directed    Call MD for:  persistant dizziness or light-headedness  As directed    Call MD for:  persistant nausea and vomiting  As directed    Call MD for:  severe uncontrolled pain  As directed    Diet - low sodium heart healthy  As directed    Discharge instructions  As directed    Comments:     Take Levaquin for pneumonia  Please have your PCP repeat chest x ray to make sure pneumonia is resolving. Please follow up with radiation in  regards to continuing radiation treatments.   Increase activity slowly  As directed        Medication List         ALPRAZolam 0.25 MG tablet  Commonly known as:  XANAX  Take one tablet by mouth three times daily as needed for anxiety     alum & mag hydroxide-simeth 200-200-20 MG/5ML suspension  Commonly known as:  MAALOX/MYLANTA  Take 30 mLs by mouth every 6 (six) hours as needed for indigestion or heartburn (dyspepsia).     amitriptyline 25 MG tablet  Commonly known as:  ELAVIL  Take 1 tablet (25 mg total) by mouth at bedtime.     aspirin 81 MG tablet  Take 81 mg by mouth daily.     benzonatate 100 MG capsule  Commonly known as:  TESSALON  Take 1 capsule (100 mg total) by mouth 2 (two) times daily as needed for cough.     ciprofloxacin 750 MG tablet  Commonly known as:  CIPRO  Take 1 tablet (750 mg total) by mouth 2 (two) times daily.     clopidogrel 75 MG tablet  Commonly known as:  PLAVIX  Take 1 tablet (75 mg total) by  mouth daily with breakfast.     DSS 100 MG Caps  Take 100 mg by mouth 2 (two) times daily.     emollient cream  Commonly known as:  BIAFINE  Apply topically 2 (two) times daily.     fluocinonide-emollient 0.05 % cream  Commonly known as:  LIDEX-E  Apply 1 application topically 2 (two) times a week.     indomethacin 25 MG capsule  Commonly known as:  INDOCIN  Take 25 mg by mouth daily as needed for moderate pain.     levETIRAcetam 500 MG tablet  Commonly known as:  KEPPRA  Take 500 mg by mouth 2 (two) times daily.     levofloxacin 750 MG tablet  Commonly known as:  LEVAQUIN  Take 1 tablet (750 mg total) by mouth every other day.     levothyroxine 125 MCG tablet  Commonly known as:  SYNTHROID, LEVOTHROID  Take 1 tablet (125 mcg total) by mouth daily before breakfast.     losartan 50 MG tablet  Commonly known as:  COZAAR  Take 1 tablet (50 mg total) by mouth daily.     methocarbamol 500 MG tablet  Commonly known as:  ROBAXIN  Take  500-1,000 mg by mouth every 8 (eight) hours as needed for muscle spasms.     ondansetron 4 MG tablet  Commonly known as:  ZOFRAN  Take 1 tablet (4 mg total) by mouth every 6 (six) hours as needed for nausea.     pantoprazole 40 MG tablet  Commonly known as:  PROTONIX  Take 40 mg by mouth 2 (two) times daily.     polyethylene glycol packet  Commonly known as:  MIRALAX / GLYCOLAX  Take 17 g by mouth daily as needed.     pravastatin 40 MG tablet  Commonly known as:  PRAVACHOL  Take 40 mg by mouth daily.     traMADol-acetaminophen 37.5-325 MG per tablet  Commonly known as:  ULTRACET  Take 1 tablet by mouth every 6 (six) hours as needed for moderate pain.            Follow-up Information   Follow up with Kirsten Grant, MD. Schedule an appointment as soon as possible for a visit in 1 week.   Specialty:  Internal Medicine   Contact information:   [address] [place] [address] [phone]        The results of significant diagnostics from this hospitalization (including imaging, microbiology, ancillary and laboratory) are listed below for reference.    Significant Diagnostic Studies: Dg Chest 2 View  11/25/2013   CLINICAL DATA:  Fever and cough.  EXAM: CHEST  2 VIEW  COMPARISON:  CT CHEST W/O CM dated 08/11/2013; DG CHEST 2 VIEW dated 07/18/2013  FINDINGS: Mediastinum and hilar structures are normal. Mild infiltrate in the right and left upper lobes cannot be excluded. Underlying nodular densities in this region cannot be excluded. These findings may just represent overlapping structures. Followup and lateral chest x-rays are suggested. Pleural thickening is again noted. No pleural effusion or pneumothorax. Diffuse interstitial prominence is present, more prominent than prior study of 07/18/2013 suggest a the possibility of active pulmonary interstitial lung disease including pneumonitis. Again follow-up chest x-ray suggested. Borderline  cardiomegaly, no pulmonary venous congestion. No acute bony abnormality. Surgical clips right upper quadrant.  IMPRESSION: 1. Cannot exclude mild infiltration upper lobes. These changes may just represent overlapping shadows. Follow-up PA and lateral chest x-rays suggested.  2. Increased interstitial markings bilaterally when compared to prior chest x-ray of 07/18/2013 and prior chest CT of 08/11/2013. Active interstitial lung disease cannot be excluded and follow-up chest x-rays suggested.   Electronically Signed   By: Marcello Moores  Register   On: 11/25/2013 17:32   Dg Chest Port 1 View  11/26/2013   CLINICAL DATA:  Shortness of breath and fever.  EXAM: PORTABLE CHEST - 1 VIEW  COMPARISON:  DG CHEST 2 VIEW dated 11/25/2013  FINDINGS: Mediastinum and hilar structures are normal. Cardiomegaly with normal pulmonary vascularity. Progressive infiltrate right upper lobe consistent with pneumonia noted. Mild basilar atelectasis. Questionable infiltrate left upper lobe has cleared. No pleural effusion or pneumothorax. Degenerative changes lumbar spine.  IMPRESSION: Progressive pulmonary infiltrate right upper lobe consistent with pneumonia.   Electronically Signed   By: Marcello Moores  Register   On: 11/26/2013 10:06    Microbiology: Recent Results (from the past 240 hour(s))  URINE CULTURE     Status: None   Collection Time    11/25/13  6:59 PM      Result Value Ref Range Status   Specimen Description URINE, CATHETERIZED   Final   Special Requests NONE   Final   Culture  Setup Time     Final   Value: 11/26/2013 03:15     Performed at [facility]     Final   Value: NO GROWTH     Performed at [facility]   Culture     Final   Value: NO GROWTH     Performed at [facility]   Report Status 11/27/2013 FINAL   Final     Labs: Basic Metabolic Panel:  Recent Labs Lab 11/25/13 1746 11/26/13 0534  NA 137 140  K 3.6* 4.3  CL 100 108  CO2 22 24  GLUCOSE 95 92  BUN 13 11   CREATININE 1.13* 1.17*  CALCIUM 10.2 8.9   Liver Function Tests:  Recent Labs Lab 11/25/13 1746  AST 12  ALT 10  ALKPHOS 81  BILITOT 0.7  PROT 7.6  ALBUMIN 3.6   No results found for this basename: LIPASE, AMYLASE,  in the last 168 hours No results found for this basename: AMMONIA,  in the last 168 hours CBC:  Recent Labs Lab 11/25/13 1746 11/26/13 0534  WBC 10.2 7.3  NEUTROABS 8.1*  --   HGB 13.0 11.4*  HCT 39.8 34.9*  MCV 90.5 90.9  PLT 270 224   Cardiac Enzymes: No results found for this basename: CKTOTAL, CKMB, CKMBINDEX, TROPONINI,  in the last 168 hours BNP: BNP (last 3 results) No results found for this basename: PROBNP,  in the last 8760 hours CBG: No results found for this basename: GLUCAP,  in the last 168 hours  Time coordinating discharge: Over 30 minutes

## 2013-11-27 NOTE — Progress Notes (Signed)
[facility] Therapy Treatment Record Phone [phone]   Radiation Therapy was administered to [name] on: 11/27/2013  11:30 AM and was treatment # 21 out of a planned course of 22 treatments.
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
## 2013-11-27 NOTE — Progress Notes (Signed)
Chaplain made a follow up visit to see the patient. Patient told chaplain "she was waiting on her daughter to pick her up, but she hasn't been able to reach her."  Patient dialed her daughter's number while the chaplain was present. Chaplain noticed that the tone was busier than a typical busy tone. Chaplain offered to dial the number for the patient. Chaplain dialed the number.  The phone rang and the patient's daughter picked up. Patient talked with her daughter and confirmed that her daughter would pick her up. Chaplain continued to provide active and reflective listening to the patient's narrative. Patient said "she could not continue to go on like this." Chaplain asked the patient for clarity. Patient shared her feelings surrounding the care she receives from her daughter. Patient said "she has no one else to help care for her." Chaplain offered emotional support and provided spiritual support through prayer.  11/27/13 1400  Clinical Encounter Type  Visited With Patient;Health care provider  Visit Type Follow-up;Spiritual support;Social support  Spiritual Encounters  Spiritual Needs Prayer;Emotional

## 2013-11-27 NOTE — Plan of Care (Addendum)
  In an effort to keep you and your family informed about your hospital stay, I am providing you with this information sheet. If you or your family have any questions, please do not hesitate to call me at the unit number ([address] [place]) [phone].  Noa, [place] 11/23/2013  2 (Number of days in the hospital)  Treatment team:  Dr. [name], Hospitalist (Internist)  Psychiatry - evaluation of capacity; patient was determined to have capacity for medical treatment decisions  Active Treatment Issues with Plan: Take Levaquin 750 mg every other day for 5 doses on discharge.  Please have your PCP repeat chest x ray to make sure pneumonia is resolving.  Please follow up with radiation in regards to continuing radiation treatments.    Assessment/Plan:   Principal Problem:  Acute respiratory failure with hypoxia  Secondary to pneumonia, healthcare acquired pneumonia.  Repeated chest x ray on 11/26/2013 showed progressive right upper lobe infiltrate consistent with pneumonia  Blood cultures were collected on the admission to guide antibiotic treatment. You were started on vancomycin and cefepime on admission but we will change it to Levaquin 750 mg every other day for 5 more doses on discharge. Check with PCP to have chest x ray repeated to evaluate for resolution of pneumonia.   Active Problems:  Urinary tract infection (UTI) Urine culture showed no growth. Levaquin would have covered for UTI Glioblastoma multiforme  You are actively undergoing radiation therapy by Dr. Sondra Come.  Continue Keppra 500 mg PO BID  Chronic kidney disease  Creatinine at baseline, 1.13  Hypothyroidism  TSH 24 on this admission with low T4=0.67  Currently on synthroid 100 mcg daily which we increased to 125 mcg daily; TSH repeat required in 3 months  Dyslipidemia  Continue statin therapy  Hypertension  Continue losartan  CAD  Continue aspirin and plavix

## 2013-11-28 MED ORDER — LEVOFLOXACIN 750 MG PO TABS: 750.0000 mg | ORAL_TABLET | ORAL | Status: DC

## 2013-11-28 NOTE — Progress Notes (Signed)
Patient is stable for discharge home today. [name] [facility] [phone]

## 2013-11-30 ENCOUNTER — Ambulatory Visit: Payer: Medicare Other

## 2013-12-01 ENCOUNTER — Ambulatory Visit: Payer: Medicare Other

## 2013-12-01 ENCOUNTER — Ambulatory Visit: Payer: Medicare Other | Admitting: Radiation Oncology

## 2013-12-02 ENCOUNTER — Ambulatory Visit: Payer: Medicare Other

## 2013-12-02 ENCOUNTER — Encounter: Payer: Self-pay | Admitting: Nutrition

## 2013-12-02 ENCOUNTER — Telehealth: Payer: Self-pay | Admitting: Oncology

## 2013-12-02 ENCOUNTER — Encounter: Admitting: Nutrition

## 2013-12-02 NOTE — Progress Notes (Signed)
Patient did not show up for radiation or nutrition appointment, which was scheduled for today, April 8.

## 2013-12-02 NOTE — Telephone Encounter (Signed)
Tried to call [name] twice two see if she is coming to radiation today.  She has missed the last two days. There was no answer and her answering machine is not taking messages.

## 2013-12-03 ENCOUNTER — Ambulatory Visit: Payer: Medicare Other

## 2013-12-03 ENCOUNTER — Telehealth: Payer: Self-pay | Admitting: Oncology

## 2013-12-03 NOTE — Telephone Encounter (Signed)
[employer].  She said she is doing "terrible."  She said she is having trouble breathing and that she has sharp pain in her right breast.  Asked her if she thinks she needs to call 911 to go the ER and she responded that she thinks she does.  She said her daughter is not able to drive due to have "heart cancer."

## 2013-12-04 ENCOUNTER — Ambulatory Visit: Admission: RE | Admit: 2013-12-04 | Payer: Medicare Other | Source: Ambulatory Visit

## 2013-12-04 ENCOUNTER — Telehealth: Payer: Self-pay | Admitting: Oncology

## 2013-12-04 NOTE — Telephone Encounter (Addendum)
Called [place] to see how she is feeling today.  She said she is feeling a little better today. Her breathing is better.  When asked if she needs to go to the ER she said she did not think so.  She also said she has not been able to pick up her antibiotics after discharge due to transportation.  She had called [facility] and they had told her that they can't deliver it to her.  Asked her if her daughter Mickel Baas was there and she said she has not seen her.  Asked for her daughters' phone numbers and she does not know what they are.  Called [facility] on [address] and they confirmed that she has not picked up any medications this week.  They also confirmed that they cannot deliver medications.  Will contact Lauren in social work to advise her of the situation.

## 2013-12-07 ENCOUNTER — Ambulatory Visit: Payer: Medicare Other

## 2013-12-08 ENCOUNTER — Ambulatory Visit: Payer: Medicare Other | Attending: Radiation Oncology

## 2013-12-08 ENCOUNTER — Ambulatory Visit: Payer: Medicare Other

## 2013-12-08 ENCOUNTER — Telehealth: Payer: Self-pay | Admitting: Oncology

## 2013-12-08 ENCOUNTER — Ambulatory Visit: Payer: Medicare Other | Admitting: Radiation Oncology

## 2013-12-08 DIAGNOSIS — C711 Malignant neoplasm of frontal lobe: Secondary | ICD-10-CM | POA: Insufficient documentation

## 2013-12-08 DIAGNOSIS — Z803 Family history of malignant neoplasm of breast: Secondary | ICD-10-CM | POA: Insufficient documentation

## 2013-12-08 DIAGNOSIS — M25579 Pain in unspecified ankle and joints of unspecified foot: Secondary | ICD-10-CM | POA: Insufficient documentation

## 2013-12-08 DIAGNOSIS — Z7982 Long term (current) use of aspirin: Secondary | ICD-10-CM | POA: Insufficient documentation

## 2013-12-08 DIAGNOSIS — J3489 Other specified disorders of nose and nasal sinuses: Secondary | ICD-10-CM | POA: Insufficient documentation

## 2013-12-08 DIAGNOSIS — Z79899 Other long term (current) drug therapy: Secondary | ICD-10-CM | POA: Insufficient documentation

## 2013-12-08 DIAGNOSIS — Z510 Encounter for antineoplastic radiation therapy: Secondary | ICD-10-CM | POA: Insufficient documentation

## 2013-12-08 DIAGNOSIS — R059 Cough, unspecified: Secondary | ICD-10-CM | POA: Insufficient documentation

## 2013-12-08 DIAGNOSIS — R42 Dizziness and giddiness: Secondary | ICD-10-CM | POA: Insufficient documentation

## 2013-12-08 DIAGNOSIS — W19XXXA Unspecified fall, initial encounter: Secondary | ICD-10-CM | POA: Insufficient documentation

## 2013-12-08 DIAGNOSIS — M810 Age-related osteoporosis without current pathological fracture: Secondary | ICD-10-CM | POA: Insufficient documentation

## 2013-12-08 DIAGNOSIS — Z90710 Acquired absence of both cervix and uterus: Secondary | ICD-10-CM | POA: Insufficient documentation

## 2013-12-08 DIAGNOSIS — E0789 Other specified disorders of thyroid: Secondary | ICD-10-CM | POA: Insufficient documentation

## 2013-12-08 DIAGNOSIS — Z85850 Personal history of malignant neoplasm of thyroid: Secondary | ICD-10-CM | POA: Insufficient documentation

## 2013-12-08 DIAGNOSIS — F29 Unspecified psychosis not due to a substance or known physiological condition: Secondary | ICD-10-CM | POA: Insufficient documentation

## 2013-12-08 DIAGNOSIS — R05 Cough: Secondary | ICD-10-CM | POA: Insufficient documentation

## 2013-12-08 DIAGNOSIS — Z8673 Personal history of transient ischemic attack (TIA), and cerebral infarction without residual deficits: Secondary | ICD-10-CM | POA: Insufficient documentation

## 2013-12-08 NOTE — Telephone Encounter (Signed)
Called [name] to see how she is doing.  She said her breathing is much better.  When asked if she had talked to her daughter, Mickel Baas, about driving her to radiation, Mickel Baas said "we don't want to talk about that."  [name] to call back if she has any questions or concerns.

## 2013-12-09 ENCOUNTER — Ambulatory Visit: Payer: Medicare Other

## 2013-12-09 ENCOUNTER — Telehealth: Payer: Self-pay | Admitting: Dietician

## 2013-12-09 NOTE — Telephone Encounter (Signed)
Brief Outpatient Oncology Nutrition Note  Patient has been identified to be at risk on malnutrition screen.  Wt Readings from Last 10 Encounters:  11/25/13 160 lb 7.9 oz (72.8 kg)  11/25/13 160 lb 9.6 oz (72.848 kg)  11/11/13 164 lb 8 oz (74.617 kg)  10/26/13 172 lb 3.2 oz (78.109 kg)  10/05/13 171 lb 9.6 oz (77.837 kg)  09/03/13 172 lb 8 oz (78.245 kg)  08/14/13 176 lb 9.4 oz (80.1 kg)  08/14/13 176 lb 9.4 oz (80.1 kg)  08/03/13 179 lb 1.3 oz (81.23 kg)  07/18/13 174 lb (78.926 kg)    Dx:  Glioblastoma  Called patient due to weight loss.  Patient was not available.  No answering service.  Was seen by inpatient RD 4/1 and diagnosed with severe malnutrition at that time.  Antonieta Iba, RD, LDN

## 2013-12-09 NOTE — Telephone Encounter (Signed)
Unable to reach patient by phone.  Discussed with social workers.

## 2013-12-10 ENCOUNTER — Ambulatory Visit: Payer: Medicare Other

## 2013-12-11 ENCOUNTER — Ambulatory Visit: Payer: Medicare Other

## 2013-12-11 ENCOUNTER — Encounter: Payer: Self-pay | Admitting: *Deleted
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
## 2013-12-14 ENCOUNTER — Ambulatory Visit: Payer: Medicare Other

## 2013-12-15 ENCOUNTER — Ambulatory Visit: Payer: Medicare Other | Admitting: Radiation Oncology

## 2013-12-15 ENCOUNTER — Ambulatory Visit: Payer: Medicare Other

## 2013-12-16 ENCOUNTER — Ambulatory Visit: Payer: Medicare Other

## 2013-12-17 ENCOUNTER — Ambulatory Visit: Payer: Medicare Other

## 2013-12-18 ENCOUNTER — Telehealth: Payer: Self-pay | Admitting: Oncology

## 2013-12-18 ENCOUNTER — Ambulatory Visit: Payer: Medicare Other

## 2013-12-18 NOTE — Telephone Encounter (Signed)
Called Hani[place] to see how she is doing.  She did not answer and could not leave a message.

## 2013-12-21 ENCOUNTER — Ambulatory Visit: Payer: Medicare Other

## 2013-12-22 ENCOUNTER — Ambulatory Visit: Payer: Medicare Other

## 2013-12-23 ENCOUNTER — Ambulatory Visit: Payer: Medicare Other

## 2013-12-24 ENCOUNTER — Ambulatory Visit: Payer: Medicare Other

## 2013-12-24 ENCOUNTER — Telehealth: Payer: Self-pay | Admitting: Oncology
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
## 2013-12-25 ENCOUNTER — Ambulatory Visit: Payer: Medicare Other

## 2013-12-25 ENCOUNTER — Encounter: Payer: Self-pay | Admitting: *Deleted

## 2013-12-25 NOTE — Progress Notes (Signed)
[name] Clinical Social [name] followed up on continued calls that pt has not attended any more treatments, no phone contact and continued issues at home. CSW has left messages for pt and APS worker this week. CSW phoned pt this afternoon and was able to speak with her. Pt stated her power was turned off, but tv heard in the background. CSW explained that CSW and RN have left messages for her. Pt states she was unaware of the calls. Pt stated daughter still has not filled her medicines and she really needs "help with things". CSW discussed with pt the times we have tried to help in the past, but then she has refused assistance. Pt stated she thought her daughter was going to pay her bills and that was why she could not go to SNF after last admission. Pt open to completing additional treatments, but transportation is a continued barrier. Pt stated a warrant was out for her arrest due to not showing up for court. CSW inquired why APS did not assist with transportation and pt was not sure.   CSW was able to speak with APS DSS worker, Orie Rout who does not appear to be following at this point and stated "we have done all we can". APS inquired if there was any new or additional information that we had to share and this CSW explained that the same issues continue in the home. APS worker stated if there was new information from the MD they would be interested in that. CSW explained that pt had not returned to the [facility] since her discharge from the hospital on 11/25/13.   CSW has utilized all transportation resources possible, but may be able to assist with follow up visit 1-2 visits.  CSW will continue to follow and [name] on plan further next Monday.    Clinical Social [name] interventions: Lobbyist.   Kirsten Racer, LCSW Clinical Social Worker [facility] [facility] for [place] Wednesday, Thursday and Friday Phone: [phone] Fax: [phone]

## 2013-12-28 ENCOUNTER — Ambulatory Visit: Payer: Medicare Other

## 2013-12-29 ENCOUNTER — Ambulatory Visit: Payer: Medicare Other

## 2013-12-30 ENCOUNTER — Ambulatory Visit: Admission: RE | Admit: 2013-12-30 | Payer: Medicare Other | Source: Ambulatory Visit

## 2013-12-30 ENCOUNTER — Telehealth: Payer: Self-pay | Admitting: Oncology

## 2013-12-31 ENCOUNTER — Ambulatory Visit: Payer: Medicare Other

## 2014-01-01 ENCOUNTER — Ambulatory Visit: Payer: Medicare Other

## 2014-01-01 ENCOUNTER — Telehealth: Payer: Self-pay | Admitting: Oncology

## 2014-01-01 NOTE — Telephone Encounter (Signed)
Tried to call [name] three times to notify her of her follow up appointment with Dr. Sondra Come on 5/18.  Patient's phone is busy.

## 2014-01-04 ENCOUNTER — Ambulatory Visit: Payer: Medicare Other

## 2014-01-04 NOTE — Telephone Encounter (Signed)
Phone is busy

## 2014-01-05 ENCOUNTER — Ambulatory Visit: Payer: Medicare Other

## 2014-01-06 ENCOUNTER — Ambulatory Visit: Payer: Medicare Other

## 2014-01-07 ENCOUNTER — Ambulatory Visit: Payer: Medicare Other

## 2014-01-07 ENCOUNTER — Telehealth: Payer: Self-pay | Admitting: Oncology

## 2014-01-07 NOTE — Telephone Encounter (Signed)
Tried to call [name] every day this week and the line was busy.  Notified Abigail, CSW that it sounds like patient's phone has been disconnected again.

## 2014-01-08 ENCOUNTER — Encounter: Payer: Self-pay | Admitting: *Deleted

## 2014-01-08 ENCOUNTER — Ambulatory Visit: Payer: Medicare Other

## 2014-01-08 NOTE — Progress Notes (Signed)
[place] Work  Clinical Social Work was referred by nurse for assistance as pt's phone appears disconnected and plan was to try to get her to come for appt on Monday. CSW left vm for APS worker and attempted to contact pt with out success. We may need to notify pt via mail of follow up appointment. CSW awaits APS return call.      [name], LCSW Clinical Social Worker [facility] for [place] Wednesday, Thursday and Friday Phone: [phone] Fax: [phone]

## 2014-01-11 ENCOUNTER — Ambulatory Visit: Admission: RE | Admit: 2014-01-11 | Payer: Medicare Other | Source: Ambulatory Visit | Admitting: Radiation Oncology

## 2014-01-11 ENCOUNTER — Encounter: Payer: Self-pay | Admitting: *Deleted

## 2014-01-11 NOTE — Progress Notes (Signed)
Clinical Social Worker contacted Di Kindle, [place] social worker to give updated appointment information. CSW left a message with pt's new appointment date and time.      Johnnye Lana, MSW, [place] Worker [facility] [phone]

## 2014-01-15 ENCOUNTER — Encounter: Payer: Self-pay | Admitting: *Deleted

## 2014-01-15 NOTE — Progress Notes (Signed)
[place] Clinical Social Work  Clinical Social Work was contacted by [place] worker, Milagros Reap and she reports she has been unable to make contact with pt either by phone or in person to discuss with her the upcoming appointment. Worker plans to keep trying and is aware to contact Enid Derry to try to reschedule the appointment when she is able to meet with pt.   Clinical Social Work interventions: Care coordination  [name], [place] Social Worker [facility] for [address] Wednesday, Thursday and Friday Phone: [phone] Fax: [phone]

## 2014-01-19 ENCOUNTER — Ambulatory Visit: Payer: Medicare Other | Admitting: Radiation Oncology

## 2014-01-19 ENCOUNTER — Encounter: Payer: Self-pay | Admitting: Radiation Oncology

## 2014-01-19 NOTE — Progress Notes (Signed)
  Radiation Oncology         [phone] ________________________________  Name: [name] MRN: [id number]  Date: 01/19/2014  DOB: 09/21/1927  End of Treatment Note  Diagnosis:    Glioblastoma multiforme in the right frontal brain   Indication for treatment:  Postop       Radiation treatment dates:   February 19 through April 3  Site/dose:   Right frontal brain, 42 gray in 21 fractions  Beams/energy:   Intensity modulated radiation therapy, 6 MV photons  Narrative: The patient tolerated radiation treatment relatively well.   She completed an abbreviated course of radiation therapy. She was recently scheduled to receive 60 gray in a postoperative fashion. Patient had significant difficulty with transportation for her radiation treatment. Social work and APS DSS was extensively involved in her case and no reliable transportation could be arranged for the patient.  Plan:  The patient will return to radiation oncology clinic for routine followup in one month. I advised them to call or return sooner if they have any questions or concerns related to their recovery or treatment.  -----------------------------------  [name], PhD, MD

## 2014-01-21 ENCOUNTER — Telehealth: Payer: Self-pay | Admitting: Oncology

## 2014-01-21 NOTE — Telephone Encounter (Signed)
[name]'s duaghter, [name] called and wants to schedule a follow up appointment with Dr. Sondra Come.  Transferred her to Santiago Glad, [place] therapist to make the appointment.

## 2014-01-25 ENCOUNTER — Encounter: Payer: Self-pay | Admitting: Oncology

## 2014-01-26 ENCOUNTER — Encounter (HOSPITAL_COMMUNITY): Payer: Self-pay

## 2014-01-28 ENCOUNTER — Ambulatory Visit: Payer: Medicare Other | Admitting: Radiation Oncology

## 2014-01-28 ENCOUNTER — Telehealth: Payer: Self-pay | Admitting: Oncology

## 2014-01-28 ENCOUNTER — Telehealth: Payer: Self-pay | Admitting: Internal Medicine

## 2014-01-28 NOTE — Telephone Encounter (Signed)
[name] called and canceled her appointment with Dr. Sondra Come.  Her daughter was not able to take her.  [name] Marker, Medical Secretary will call her to reschedule.

## 2014-01-28 NOTE — Telephone Encounter (Signed)
[name] called from [facility] wanting to know if Dr. Asa Lente will be the attending physical and if Dr. Asa Lente want their doctor at hospice to manage her symptoms. Please advise.

## 2014-01-28 NOTE — Telephone Encounter (Signed)
Beryl called asking for her daughter, Laura's phone number.  Verified with her that we have the same number that she has for Mickel Baas.  She said she has been trying to reach Mickel Baas this morning to take her to her appointment with Dr. Sondra Come at 10:40.

## 2014-01-28 NOTE — Telephone Encounter (Signed)
Called [name] to see how she is doing.  She said that she is worried about her daughter [name].  She said [name] has not been home since yesterday.  She also said she thinks [name] has taken her pistol.  She said it has been missing for 3-4 months.  [name] is the registered owner of the pistol.  [name] has told her not to worry about it.  She said she has called a hotline in [place] about this and was told to call closer to home.  [name], CSW was in the clinic at the time and took over the phone call from this point.

## 2014-01-29 ENCOUNTER — Encounter: Payer: Self-pay | Admitting: *Deleted

## 2014-01-29 NOTE — Progress Notes (Signed)
[place] Work  Clinical Social Work was referred by nurse, Elmo Putt as she was on the phone with pt and CSW walked into clinic. Pt tearful on the phone and expressed concern that her daughter was not home and had not been seen for "about a day and a half". Pt upset that her daughter was not going to be able to bring her to her appointment. CSW inquired if daughter was aware of the appointment, pt replied no, but daughter was the one who called to make the appointment several weeks ago.   Pt went on to express concerns that her daughter took her pistol and she/pt wanted this back for protection. It may be possible, that daughter removed gun due to safety concerns. Pt concerned that daughter sold gun for drugs.  Per pt, she and her daughter went to the [place] to see counselor and get her daughter's  "mental medications changed". Pt concerned daughter is still having mental health concerns, not answering her phone and does not know where she is. CSW inquired if we knew an address where daughter could be to send out someone to do a welfare check. Pt replied, she did not know where pt was and this was very upsetting to pt.   Pt also stated that "social services wanted to buy her house" from her and send her away. Pt stated she wanted to stay in her home with her animals and see if someone could come to care for her there. Pt has been referred to home health in the past and then refused services. Pt appears to continue to need assistance with aspects of her daily living and obtaining health care delivery. Pt denied being unsafe currently, reported she had food to eat, no health concerns. She was most concerned about her daughter and that she missed her appointment. CSW phoned APS worker, Julieta Gutting and left message about these noted concerns.    Clinical Social Work interventions: Supportive listening and problem solving Emotional Support Referral to APS made  [name], LCSW Clinical Social  Worker [facility] for [place] Wednesday, Thursday and Friday Phone: [phone] Fax: [phone]

## 2014-01-29 NOTE — Telephone Encounter (Signed)
Yes and yes thanks

## 2014-01-29 NOTE — Telephone Encounter (Signed)
Notified stacy with md response...[name]

## 2014-02-03 ENCOUNTER — Ambulatory Visit
Admission: RE | Admit: 2014-02-03 | Discharge: 2014-02-03 | Disposition: A | Discharge status: Home / Self Care | Payer: Medicare Other | Source: Ambulatory Visit | Attending: Radiation Oncology | Admitting: Radiation Oncology

## 2014-02-03 ENCOUNTER — Encounter: Payer: Self-pay | Admitting: Internal Medicine

## 2014-02-03 ENCOUNTER — Ambulatory Visit (INDEPENDENT_AMBULATORY_CARE_PROVIDER_SITE_OTHER): Payer: Medicare Other | Admitting: Internal Medicine

## 2014-02-03 ENCOUNTER — Encounter: Payer: Self-pay | Admitting: Radiation Oncology

## 2014-02-03 ENCOUNTER — Other Ambulatory Visit (INDEPENDENT_AMBULATORY_CARE_PROVIDER_SITE_OTHER): Payer: Medicare Other

## 2014-02-03 VITALS — BP 130/78 | HR 63 | Temp 98.4°F | Wt 167.8 lb

## 2014-02-03 VITALS — BP 168/81 | HR 93 | Temp 97.4°F | Resp 16 | Ht 64.0 in | Wt 168.7 lb

## 2014-02-03 DIAGNOSIS — R300 Dysuria: Secondary | ICD-10-CM

## 2014-02-03 DIAGNOSIS — E039 Hypothyroidism, unspecified: Secondary | ICD-10-CM

## 2014-02-03 DIAGNOSIS — I6350 Cerebral infarction due to unspecified occlusion or stenosis of unspecified cerebral artery: Secondary | ICD-10-CM

## 2014-02-03 DIAGNOSIS — C719 Malignant neoplasm of brain, unspecified: Secondary | ICD-10-CM

## 2014-02-03 LAB — URINALYSIS, ROUTINE W REFLEX MICROSCOPIC
Bilirubin Urine: NEGATIVE
HGB URINE DIPSTICK: NEGATIVE
Ketones, ur: NEGATIVE
Leukocytes, UA: NEGATIVE
NITRITE: NEGATIVE
PH: 5.5 (ref 5.0–8.0)
Specific Gravity, Urine: >=1.03 — AB (ref 1.000–1.030)
Urine Glucose: NEGATIVE
Urobilinogen, UA: 0.2 (ref 0.0–1.0)

## 2014-02-03 LAB — TSH: TSH: 113.71 u[IU]/mL — ABNORMAL HIGH (ref 0.35–4.50)

## 2014-02-03 NOTE — Progress Notes (Signed)
Subjective:    Patient ID: [name], female    DOB: 1927-09-14, 80 y.o.   MRN: [id number]  HPI  Patient is here for follow up  Reviewed chronic medical issues and interval medical events  Past Medical History  Diagnosis Date  . CVA 1998    "mini" cva, no residual deficts  . ARTHRITIS   . URINARY INCONTINENCE   . DEPRESSION   . DDD (degenerative disc disease)     Cervical spine & knees  . GERD   . DYSLIPIDEMIA   . HYPERTENSION   . HYPOTHYROIDISM   . PEPTIC ULCER DISEASE 11/2009  . Osteoporosis   . CARCINOMA, THYROID GLAND, PAPILLARY 09/2008    s/p total thyroidectomy  . Glioblastoma   . CKD (chronic kidney disease) stage 2, GFR 60-89 ml/min   . Radiation 10/15/13-11/27/13    right frontal brain 42 gray    Review of Systems  Constitutional: Positive for fatigue.  Gastrointestinal: Positive for constipation. Negative for nausea and diarrhea.  Genitourinary: Positive for dysuria and urgency.  Neurological: Positive for weakness, light-headedness and headaches.  Psychiatric/Behavioral: Positive for dysphoric mood and decreased concentration.       Objective:   Physical Exam  BP 130/78  Pulse 63  Temp(Src) 98.4 F (36.9 C) (Oral)  Wt 167 lb 12.8 oz (76.114 kg)  SpO2 94% Wt Readings from Last 3 Encounters:  02/03/14 167 lb 12.8 oz (76.114 kg)  11/25/13 160 lb 7.9 oz (72.8 kg)  11/25/13 160 lb 9.6 oz (72.848 kg)   Constitutional: She is emotional, tearful - chronically ill - No physical distress.  Neck: Normal range of motion. Neck supple. No JVD present. No thyromegaly present.  Cardiovascular: Normal rate, regular rhythm and normal heart sounds.  No murmur heard. No BLE edema. Pulmonary/Chest: Effort normal and breath sounds normal. No respiratory distress. She has no wheezes.  Psychiatric: She has a dysphoric and tearful mood and affect. Her behavior is normal. Judgment and thought content normal.   Lab Results  Component Value Date   WBC 7.3 11/26/2013     HGB 11.4* 11/26/2013   HCT 34.9* 11/26/2013   PLT 224 11/26/2013   GLUCOSE 92 11/26/2013   CHOL 199 09/22/2012   TRIG 134.0 09/22/2012   HDL 42.40 09/22/2012   LDLCALC 130* 09/22/2012   ALT 10 11/25/2013   AST 12 11/25/2013   NA 140 11/26/2013   K 4.3 11/26/2013   CL 108 11/26/2013   CREATININE 1.17* 11/26/2013   BUN 11 11/26/2013   CO2 24 11/26/2013   TSH 24.320* 11/26/2013   INR 1.01 08/12/2013   HGBA1C 5.7 04/24/2013    No results found.     Assessment & Plan:   Dysuria, check urinalysis for potential UTI/cystitis today. Begin empiric antibiotic therapy if urinalysis abnormal  Problem List Items Addressed This Visit   Glioblastoma multiforme of brain - Primary     Dx 07/2013 S/p XRT and following intermittently with onc/raad onc for same Treatment complicated by difficult home social situation Call to social worker at oncology today where patient will proceed after her lab testing here    HYPOTHYROIDISM      Postsurgical 09/2008 for thyroid cancer tx Noted reports of noncompliance with medication since diagnosis of GBM  recheck now -encourage compliance as prescribed  Lab Results  Component Value Date   TSH 113.71 Repeated and verified X2.* 02/03/2014      Relevant Orders      TSH (Completed)  Other Visit Diagnoses   Dysuria        Relevant Orders       Urinalysis, Routine w reflex microscopic (Completed)

## 2014-02-03 NOTE — Progress Notes (Signed)
Pre visit review using our clinic review tool, if applicable. No additional management support is needed unless otherwise documented below in the visit note. 

## 2014-02-03 NOTE — Progress Notes (Signed)
Radiation Oncology         [phone] ________________________________  Name: [name] MRN: [id number]  Date: 02/03/2014  DOB: 1928/05/16  Follow-Up Visit Note  CC: [name], MD  Volanda Napoleon, MD  Diagnosis:   Glioblastoma multiforme in the right frontal brain    Interval Since Last Radiation:  2  months  Narrative:  The patient returns today for routine follow-up.  She completed an abbreviated course of radiation therapy in light of her transportation difficulties and home situation.  Patient did see her primary care physician earlier today and presented here for followup appointment. She has some mild intermittent frontal headaches but no sign.  blurring of her vision or double vision. She admits to being  tearful.  She denies any focal motor weakness occasionally will notice dizziness when going from a sitting to standing position or tending to her pets.                              ALLERGIES:  is allergic to iodine.  Meds: Current Outpatient Prescriptions  Medication Sig Dispense Refill  . ALPRAZolam (XANAX) 0.25 MG tablet Take one tablet by mouth three times daily as needed for anxiety  90 tablet  5  . aspirin 81 MG tablet Take 325 mg by mouth daily.       Marland Kitchen docusate sodium 100 MG CAPS Take 100 mg by mouth 2 (two) times daily.  10 capsule  0  . levothyroxine (SYNTHROID, LEVOTHROID) 125 MCG tablet Take 1 tablet (125 mcg total) by mouth daily before breakfast.  30 tablet  0  . methocarbamol (ROBAXIN) 500 MG tablet Take 500-1,000 mg by mouth every 8 (eight) hours as needed for muscle spasms.      . pantoprazole (PROTONIX) 40 MG tablet Take 40 mg by mouth 2 (two) times daily.        Marland Kitchen alum & mag hydroxide-simeth (MAALOX/MYLANTA) 200-200-20 MG/5ML suspension Take 30 mLs by mouth every 6 (six) hours as needed for indigestion or heartburn (dyspepsia).  355 mL  0  . amitriptyline (ELAVIL) 25 MG tablet Take 1 tablet (25 mg total) by mouth at bedtime.  30 tablet  5    . benzonatate (TESSALON) 100 MG capsule Take 1 capsule (100 mg total) by mouth 2 (two) times daily as needed for cough.  20 capsule  0  . clopidogrel (PLAVIX) 75 MG tablet Take 1 tablet (75 mg total) by mouth daily with breakfast.  30 tablet  11  . emollient (BIAFINE) cream Apply topically 2 (two) times daily.      . fluocinonide-emollient (LIDEX-E) 0.05 % cream Apply 1 application topically 2 (two) times a week.       . indomethacin (INDOCIN) 25 MG capsule Take 25 mg by mouth daily as needed for moderate pain.      Marland Kitchen levETIRAcetam (KEPPRA) 500 MG tablet Take 500 mg by mouth 2 (two) times daily.      Marland Kitchen losartan (COZAAR) 50 MG tablet Take 1 tablet (50 mg total) by mouth daily.  30 tablet  11  . ondansetron (ZOFRAN) 4 MG tablet Take 1 tablet (4 mg total) by mouth every 6 (six) hours as needed for nausea.  20 tablet  0  . polyethylene glycol (MIRALAX / GLYCOLAX) packet Take 17 g by mouth daily as needed.      . pravastatin (PRAVACHOL) 40 MG tablet Take 40 mg by mouth daily.      Marland Kitchen  traMADol-acetaminophen (ULTRACET) 37.5-325 MG per tablet Take 1 tablet by mouth every 6 (six) hours as needed for moderate pain.  30 tablet  0   No current facility-administered medications for this encounter.    Physical Findings: The patient is in no acute distress. Patient is alert and oriented. Mild confusion noted but no  significant change since my last exam  height is 5\' 4"  (1.626 m) and weight is 168 lb 11.2 oz (76.522 kg). Her oral temperature is 97.4 F (36.3 C). Her blood pressure is 168/81 and her pulse is 93. Her respiration is 16 and oxygen saturation is 95%. .  Alopecia is noted along the right frontal scalp from her radiation therapy. No significant skin reaction at this time. The oral cavity is moist without secondary infection. The pupils are equal round and reactive to light. The extraocular eye movements are intact. The lungs are clear. The heart has a regular rhythm and rate. On neurological  examination motor strength is 5 out of 5 in the proximal and distal muscle groups of the upper and lower extremities.   Lab Findings: Lab Results  Component Value Date   WBC 7.3 11/26/2013   HGB 11.4* 11/26/2013   HCT 34.9* 11/26/2013   MCV 90.9 11/26/2013   PLT 224 11/26/2013      Radiographic Findings: No results found.  Impression:  Clinically stable  Plan:  Routine followup in 2 months.  Earlier this week hospice did present to the patient's home for evaluation.  Today I discussed potential chemotherapy as part of her management. At This point patient does not wish to consider this treatment approach,  feels comfortable with hospice assistance.  ____________________________________ [name], MD

## 2014-02-03 NOTE — Assessment & Plan Note (Signed)
Postsurgical 09/2008 for thyroid cancer tx Noted reports of noncompliance with medication since diagnosis of GBM  recheck now -encourage compliance as prescribed  Lab Results  Component Value Date   TSH 113.71 Repeated and verified X2.* 02/03/2014

## 2014-02-03 NOTE — Progress Notes (Addendum)
Toinette Lackie arrived without an appointment for follow up after treatment to her right frontal brain.  She reports having headaches that go from "side to side" on her head that she is rating at a 6/10.  She says they come and go.  She has noticed lately that she has had more pain.  She has a slightly slurred voice today.  She reports that she looses her balance when leaning over.  She reports fatigue.  She reports that her vision is blurry but is not sure if this has changed since radiation.  She reports that her fingers on the ends are numb and tasks are taking her twice as long.  She said hospice came to her house yesterday and refilled 4 medications, she is not sure which once.  She has her medication bottles with her today and only has pills in 4 bottles: protonix, losartan, colace and aspirin.  She also reported taking a muscle relaxer this morning.  She is not taking kepra, levothyroxine and plavix.  Her weight is up 8 lbs from her last visit.  She reports a good appetite.  The skin on her scalp is peeling.  Advised her to use a vitamin E cream or cocoa butter.

## 2014-02-03 NOTE — Assessment & Plan Note (Signed)
Dx 07/2013 S/p XRT and following intermittently with onc/raad onc for same Treatment complicated by difficult home social situation Call to social worker at oncology today where patient will proceed after her lab testing here

## 2014-02-03 NOTE — Patient Instructions (Signed)
It was good to see you today.  We have reviewed your prior records including labs and tests today  Test(s) ordered today. Your results will be released to [facility] (or called to you) after review, usually within 72hours after test completion. If any changes need to be made, you will be notified at that same time.  Medications reviewed and updated, no changes recommended at this time.  Continue working with social work, adult protective services, and your other physicians as ongoing  Please schedule followup in 3-4 months, call sooner if problems.

## 2014-02-04 ENCOUNTER — Other Ambulatory Visit: Payer: Self-pay | Admitting: Internal Medicine

## 2014-02-04 ENCOUNTER — Encounter: Payer: Self-pay | Admitting: *Deleted

## 2014-02-04 ENCOUNTER — Other Ambulatory Visit: Payer: Self-pay | Admitting: *Deleted

## 2014-02-04 MED ORDER — LEVOTHYROXINE SODIUM 125 MCG PO TABS: 125.0000 ug | ORAL_TABLET | Freq: Every day | ORAL | Status: DC

## 2014-02-04 MED ORDER — LOSARTAN POTASSIUM 50 MG PO TABS: 50.0000 mg | ORAL_TABLET | Freq: Every day | ORAL | Status: DC

## 2014-02-04 NOTE — Telephone Encounter (Signed)
Called pt concerning labs results. Pt is needing refill on her synthroid [facility]

## 2014-02-04 NOTE — Telephone Encounter (Signed)
[facility] 

## 2014-02-04 NOTE — Progress Notes (Signed)
[name]  Clinical Social [name] was referred by nurse for assessment of psychosocial needs due to pt showing up unannounced and with no appointment today.  Clinical Social Worker met with patient at length prior to, during and after her meeting with Dr. Sondra Come at [facility] to offer support and assess for needs.  Pt arranged a cab to get her here today and has cab fare to get home as well.   Pt continues to have ongoing issues with her daughter, finances, lack of transportation and some issues with ADLs. She reports Hospice and Palliative Care have come out to the house several times to date. Pt feels she still wants to stay home, if she could have some additional supports to assist her. She has been working with APS to try to prove she can continue to stay home and manage her own needs. Pt tearful and shared many struggles with her daughter. These are not new issues and the same ones pt has shared in the past. Daughter appears to have substance abuse concerns and mental health issues, per pt. Pt feels she now needs to get "tough" on her daughter in order to get her own needs met.  CSW shared community resources that could be of assistance for pt. CSW also encouraged pt to share her many concerns with her new RN and CSW through [facility]. CSW explained how much they could help in the home. Pt is agreeable to sharing her story with them.   Pt appeared lighter and uplifted after our discussion. CSW will reach out to team at [facility] as well to advocate for pt's needs. CSW to follow and see at next appointment as needed.    Clinical Social [name] interventions: Problem solving, supportive Psychiatric nurse and referral Pt advocacy   [name], [place] Social Worker [facility] [place] for [place] Wednesday, Thursday and Friday Phone: [phone] Fax: [phone]

## 2014-02-04 NOTE — Progress Notes (Signed)
[facility].

## 2014-03-02 ENCOUNTER — Telehealth: Payer: Self-pay | Admitting: Internal Medicine

## 2014-03-02 NOTE — Telephone Encounter (Signed)
Daughter states mom is moving slower in motion and speech and does not seem to be usual self.  She does not want to see another provider.  She would like to get in with you some time soon. Daughter states she does not believe mom has had stroke. Called after hours.  Did not want to speak to call a nurse.  Please advise.

## 2014-03-02 NOTE — Telephone Encounter (Signed)
Ok to add on 845 tomorrow AM or 8 am Thursday 7/9 Thanks!

## 2014-03-02 NOTE — Telephone Encounter (Signed)
Tried to reach patient three time before leaving this afternoon. Every time I called phone would ring busy.  Will try again in the morning.

## 2014-03-03 NOTE — Telephone Encounter (Signed)
Tried to reach daughter at 3:30 this afternoon. Ring no answer.  Will try one more time.

## 2014-03-03 NOTE — Telephone Encounter (Signed)
FYI: Called one more time at 4:30 before leaving.  Ring No answer.  Will wait for patient to call back to schedule appt.

## 2014-03-03 NOTE — Telephone Encounter (Signed)
Tried this morning.  Ring no answer.  I will try again later on today.

## 2014-03-05 NOTE — Telephone Encounter (Signed)
Thanks for the efforts When pt does call, ok to put into any open appt I may have thanks

## 2014-03-10 ENCOUNTER — Telehealth: Payer: Self-pay | Admitting: Internal Medicine

## 2014-03-10 MED ORDER — POLYETHYLENE GLYCOL 3350 17 GM/SCOOP PO POWD: 17.0000 g | Freq: Every day | ORAL | Status: DC

## 2014-03-10 NOTE — Telephone Encounter (Signed)
Ok done

## 2014-03-10 NOTE — Telephone Encounter (Signed)
Onyeje, prime nurse from hospice, call requesting Dr. Asa Lente to [facility] [name]. Please advise.

## 2014-04-05 ENCOUNTER — Ambulatory Visit: Payer: Medicare Other | Admitting: Radiation Oncology

## 2014-04-07 ENCOUNTER — Telehealth: Payer: Self-pay

## 2014-04-07 NOTE — Telephone Encounter (Signed)
[facility] Nurse) called and was very concerned with the well being of pt.  Nurse stated that pt was very lethargic and weak. BP was down, O2 was 92% and does not seem like she has eaten at all today. Pt was not able to get up to get the door when the nurse showed up this afternoon and was not able to speak clearly or coherently.   Nurse informs that pt went to court last week and was deemed competent, however, the nurse is indicating otherwise.  There are no active phone numbers. Pt's daughter, who is said to be giving care, is said to be incarcerated.   Nurse stated that 911 was not called.

## 2014-04-09 NOTE — Telephone Encounter (Signed)
This is a well documented, difficult social situation - I appreciate the nurse update I suggest hospice involve their SW/CM if not already involved (though i believe they are) I have no new recommended - esp as the court has weighed in a deemed our pt competent - thanks

## 2014-04-14 ENCOUNTER — Telehealth: Payer: Self-pay

## 2014-04-14 NOTE — Telephone Encounter (Signed)
Faxed recvd from Hospice. "the information is needed to make a determination regaring whether or not the named individual should be paid benefits directly or to a designated party payee"  Letter has been given to MD for review and consideration.

## 2014-04-19 ENCOUNTER — Ambulatory Visit: Attending: Radiation Oncology | Admitting: Radiation Oncology

## 2014-07-16 ENCOUNTER — Telehealth: Payer: Self-pay | Admitting: Internal Medicine

## 2014-07-16 NOTE — Telephone Encounter (Signed)
All info noted Agree no change meds or mgmt needed Yes, I co-manage w/ hospice Thanks for note

## 2014-07-16 NOTE — Telephone Encounter (Signed)
Nurse Clifton James) wanted to give an update of the following pt:  Bed bound Siezure activity (starring off several times a day) is suspected.  Blood found on the left side of face where she had bit her tongue.  No new extremity weakness.   Nurse Clifton James) did indicate that there did not seem to be a need for any additional medication unless PCP feels otherwise.  Nurse did want to confirm that Hospice and PCP co manage.

## 2014-07-16 NOTE — Telephone Encounter (Signed)
hospic nurse called in said that she has a stat update, please call her at [phone]

## 2014-07-17 ENCOUNTER — Encounter (HOSPITAL_COMMUNITY): Payer: Self-pay

## 2014-07-17 ENCOUNTER — Inpatient Hospital Stay (HOSPITAL_COMMUNITY)
Admission: EM | Admit: 2014-07-17 | Discharge: 2014-07-21 | DRG: 101 | Disposition: A | Discharge status: Hospice | Attending: Internal Medicine | Admitting: Internal Medicine

## 2014-07-17 ENCOUNTER — Emergency Department (HOSPITAL_COMMUNITY)

## 2014-07-17 DIAGNOSIS — Z888 Allergy status to other drugs, medicaments and biological substances status: Secondary | ICD-10-CM

## 2014-07-17 DIAGNOSIS — E890 Postprocedural hypothyroidism: Secondary | ICD-10-CM | POA: Diagnosis present

## 2014-07-17 DIAGNOSIS — R569 Unspecified convulsions: Secondary | ICD-10-CM | POA: Diagnosis not present

## 2014-07-17 DIAGNOSIS — Z803 Family history of malignant neoplasm of breast: Secondary | ICD-10-CM | POA: Diagnosis not present

## 2014-07-17 DIAGNOSIS — Z923 Personal history of irradiation: Secondary | ICD-10-CM

## 2014-07-17 DIAGNOSIS — I959 Hypotension, unspecified: Secondary | ICD-10-CM | POA: Diagnosis present

## 2014-07-17 DIAGNOSIS — Z609 Problem related to social environment, unspecified: Secondary | ICD-10-CM

## 2014-07-17 DIAGNOSIS — G40909 Epilepsy, unspecified, not intractable, without status epilepticus: Secondary | ICD-10-CM | POA: Diagnosis present

## 2014-07-17 DIAGNOSIS — Z9049 Acquired absence of other specified parts of digestive tract: Secondary | ICD-10-CM | POA: Diagnosis present

## 2014-07-17 DIAGNOSIS — E785 Hyperlipidemia, unspecified: Secondary | ICD-10-CM | POA: Diagnosis present

## 2014-07-17 DIAGNOSIS — Z833 Family history of diabetes mellitus: Secondary | ICD-10-CM

## 2014-07-17 DIAGNOSIS — C719 Malignant neoplasm of brain, unspecified: Secondary | ICD-10-CM | POA: Diagnosis present

## 2014-07-17 DIAGNOSIS — D72829 Elevated white blood cell count, unspecified: Secondary | ICD-10-CM | POA: Diagnosis present

## 2014-07-17 DIAGNOSIS — M810 Age-related osteoporosis without current pathological fracture: Secondary | ICD-10-CM | POA: Diagnosis present

## 2014-07-17 DIAGNOSIS — I129 Hypertensive chronic kidney disease with stage 1 through stage 4 chronic kidney disease, or unspecified chronic kidney disease: Secondary | ICD-10-CM | POA: Diagnosis present

## 2014-07-17 DIAGNOSIS — N182 Chronic kidney disease, stage 2 (mild): Secondary | ICD-10-CM | POA: Diagnosis present

## 2014-07-17 DIAGNOSIS — M1990 Unspecified osteoarthritis, unspecified site: Secondary | ICD-10-CM | POA: Diagnosis present

## 2014-07-17 DIAGNOSIS — Z8261 Family history of arthritis: Secondary | ICD-10-CM | POA: Diagnosis not present

## 2014-07-17 DIAGNOSIS — Z66 Do not resuscitate: Secondary | ICD-10-CM | POA: Diagnosis present

## 2014-07-17 DIAGNOSIS — Z90710 Acquired absence of both cervix and uterus: Secondary | ICD-10-CM

## 2014-07-17 DIAGNOSIS — Z515 Encounter for palliative care: Secondary | ICD-10-CM

## 2014-07-17 DIAGNOSIS — K219 Gastro-esophageal reflux disease without esophagitis: Secondary | ICD-10-CM | POA: Diagnosis present

## 2014-07-17 DIAGNOSIS — R32 Unspecified urinary incontinence: Secondary | ICD-10-CM | POA: Diagnosis present

## 2014-07-17 DIAGNOSIS — Z8673 Personal history of transient ischemic attack (TIA), and cerebral infarction without residual deficits: Secondary | ICD-10-CM

## 2014-07-17 DIAGNOSIS — F329 Major depressive disorder, single episode, unspecified: Secondary | ICD-10-CM | POA: Diagnosis present

## 2014-07-17 DIAGNOSIS — Z659 Problem related to unspecified psychosocial circumstances: Secondary | ICD-10-CM

## 2014-07-17 DIAGNOSIS — I10 Essential (primary) hypertension: Secondary | ICD-10-CM | POA: Diagnosis present

## 2014-07-17 LAB — BASIC METABOLIC PANEL
Anion gap: 13 (ref 5–15)
BUN: 23 mg/dL (ref 6–23)
CO2: 24 meq/L (ref 19–32)
Calcium: 9.8 mg/dL (ref 8.4–10.5)
Chloride: 99 mEq/L (ref 96–112)
Creatinine, Ser: 1.26 mg/dL — ABNORMAL HIGH (ref 0.50–1.10)
GFR calc Af Amer: 44 mL/min — ABNORMAL LOW (ref >90)
GFR calc non Af Amer: 38 mL/min — ABNORMAL LOW (ref >90)
Glucose, Bld: 84 mg/dL (ref 70–99)
POTASSIUM: 4.4 meq/L (ref 3.7–5.3)
SODIUM: 136 meq/L — AB (ref 137–147)

## 2014-07-17 LAB — CBC WITH DIFFERENTIAL/PLATELET
Basophils Absolute: 0 10*3/uL (ref 0.0–0.1)
Basophils Relative: 0 % (ref 0–1)
Eosinophils Absolute: 0 10*3/uL (ref 0.0–0.7)
Eosinophils Relative: 0 % (ref 0–5)
HCT: 30.5 % — ABNORMAL LOW (ref 36.0–46.0)
Hemoglobin: 10.2 g/dL — ABNORMAL LOW (ref 12.0–15.0)
LYMPHS PCT: 15 % (ref 12–46)
Lymphs Abs: 1.7 10*3/uL (ref 0.7–4.0)
MCH: 32.7 pg (ref 26.0–34.0)
MCHC: 33.4 g/dL (ref 30.0–36.0)
MCV: 97.8 fL (ref 78.0–100.0)
Monocytes Absolute: 0.5 10*3/uL (ref 0.1–1.0)
Monocytes Relative: 5 % (ref 3–12)
NEUTROS PCT: 80 % — AB (ref 43–77)
Neutro Abs: 8.7 10*3/uL — ABNORMAL HIGH (ref 1.7–7.7)
PLATELETS: 198 10*3/uL (ref 150–400)
RBC: 3.12 MIL/uL — AB (ref 3.87–5.11)
RDW: 18.5 % — ABNORMAL HIGH (ref 11.5–15.5)
WBC: 11 10*3/uL — ABNORMAL HIGH (ref 4.0–10.5)

## 2014-07-17 LAB — PHENYTOIN LEVEL, TOTAL

## 2014-07-17 MED ORDER — HYDROCODONE-ACETAMINOPHEN 5-325 MG PO TABS: 1.0000 | ORAL_TABLET | ORAL | Status: DC | PRN

## 2014-07-17 MED ORDER — DEXAMETHASONE 2 MG PO TABS
2.0000 mg | ORAL_TABLET | Freq: Two times a day (BID) | ORAL | Status: DC
  Filled 2014-07-17: qty 1

## 2014-07-17 MED ORDER — ACETAMINOPHEN 325 MG PO TABS: 650.0000 mg | ORAL_TABLET | Freq: Four times a day (QID) | ORAL | Status: DC | PRN

## 2014-07-17 MED ORDER — LORAZEPAM 2 MG/ML PO CONC: 1.0000 mg | ORAL | Status: DC

## 2014-07-17 MED ORDER — ALPRAZOLAM 0.25 MG PO TABS: 0.2500 mg | ORAL_TABLET | Freq: Four times a day (QID) | ORAL | Status: DC | PRN

## 2014-07-17 MED ORDER — DEXAMETHASONE SODIUM PHOSPHATE 10 MG/ML IJ SOLN
10.0000 mg | Freq: Once | INTRAMUSCULAR | Status: AC
  Administered 2014-07-17: 10 mg via INTRAVENOUS
  Filled 2014-07-17: qty 1

## 2014-07-17 MED ORDER — ONDANSETRON HCL 4 MG/2ML IJ SOLN: 4.0000 mg | Freq: Four times a day (QID) | INTRAMUSCULAR | Status: DC | PRN

## 2014-07-17 MED ORDER — HYDROCODONE-ACETAMINOPHEN 5-325 MG PO TABS: 1.0000 | ORAL_TABLET | Freq: Four times a day (QID) | ORAL | Status: DC | PRN

## 2014-07-17 MED ORDER — LORAZEPAM 2 MG/ML IJ SOLN
1.0000 mg | Freq: Once | INTRAMUSCULAR | Status: AC
  Administered 2014-07-17: 1 mg via INTRAVENOUS
  Filled 2014-07-17: qty 1

## 2014-07-17 MED ORDER — LEVETIRACETAM IN NACL 500 MG/100ML IV SOLN
500.0000 mg | Freq: Two times a day (BID) | INTRAVENOUS | Status: DC
  Administered 2014-07-17 – 2014-07-21 (×8): 500 mg via INTRAVENOUS
  Filled 2014-07-17 (×8): qty 100

## 2014-07-17 MED ORDER — ONDANSETRON HCL 4 MG PO TABS: 4.0000 mg | ORAL_TABLET | Freq: Four times a day (QID) | ORAL | Status: DC | PRN

## 2014-07-17 MED ORDER — DOCUSATE SODIUM 100 MG PO CAPS
100.0000 mg | ORAL_CAPSULE | Freq: Two times a day (BID) | ORAL | Status: DC
  Administered 2014-07-18 – 2014-07-20 (×2): 100 mg via ORAL
  Filled 2014-07-17 (×9): qty 1

## 2014-07-17 MED ORDER — PANTOPRAZOLE SODIUM 40 MG PO TBEC
40.0000 mg | DELAYED_RELEASE_TABLET | Freq: Every day | ORAL | Status: DC
  Filled 2014-07-17 (×4): qty 1

## 2014-07-17 MED ORDER — SODIUM CHLORIDE 0.9 % IV SOLN
1000.0000 mg | Freq: Once | INTRAVENOUS | Status: AC
  Administered 2014-07-17: 1000 mg via INTRAVENOUS
  Filled 2014-07-17: qty 20

## 2014-07-17 MED ORDER — ACETAMINOPHEN 650 MG RE SUPP: 650.0000 mg | Freq: Four times a day (QID) | RECTAL | Status: DC | PRN

## 2014-07-17 MED ORDER — ALPRAZOLAM 0.5 MG PO TABS: 0.5000 mg | ORAL_TABLET | Freq: Every evening | ORAL | Status: DC | PRN

## 2014-07-17 NOTE — Progress Notes (Signed)
Attempted to call for report from unit 3W- Hortencia Conradi RN

## 2014-07-17 NOTE — ED Notes (Signed)
Per EMS-Patient is a Hospice patient. Patient was in Hospice until patient's daughter brought her home 2 days ago. Daughter told EMS that she has been having seizures x 2 days. Patient is a DNR. EMS reported to mild seizures while en route to the ED. Patient started on Dilantin today.

## 2014-07-17 NOTE — ED Notes (Signed)
Dr. Roel Cluck is aware of drop in BP and believes this d/t dilantin.

## 2014-07-17 NOTE — H&P (Signed)
PCP:  [name], MD  Now followed by Hospice Doctor  Chief Complaint:  seizures  HPI: [name] is a 80 y.o. female   has a past medical history of CVA (1998); ARTHRITIS; URINARY INCONTINENCE; DEPRESSION; DDD (degenerative disc disease); GERD; DYSLIPIDEMIA; HYPERTENSION; HYPOTHYROIDISM; PEPTIC ULCER DISEASE (11/2009); Osteoporosis; CARCINOMA, THYROID GLAND, PAPILLARY (09/2008); Glioblastoma; CKD (chronic kidney disease) stage 2, GFR 60-89 ml/min; and Radiation (10/15/13-11/27/13).   Presented with  Patient has hx of GBM sp resection in December 2014 and radiation therapy. Patient is currently on Hospice. She was at [facility] place until 11/18. Patient wanted to go home. Her anti-seizure medications were discontinued while at [facility] place. 2 days ago patient started to have partial seizures that progressed to Grand-mal seizures. She was started on dilating and ativan but only got one dose of dilantin and started to have prolonged episodes of uncontroled seizure activity over 10 seizures in the past 24 hours. Patient's daughter would like her to be comfort care but would like the seizures to be under control. Family wishes for readmission to [facility] place.  In emergency department patient was given Dilantin IV. After being loaded with Dilantin the blood pressure noted to drop down to 70s over 40s. Patient was given IV bolus of normal saline and Dilantin was changed to Keppra. Per nursing staff that has been some reports from the neighbor regarding the reason for the patient to be transferred from [facility] to home. It is unclear if that was per the patient's wishes versus the daughter's wishes. Social work consult would be warranted to clarify the picture. Apparently the daughter for the patient is currently residing this have long-standing history of drug abuse and incarceration. Those issues had interfered with patient's care in the past to the point where APS has been contacted in the  past.   Hospitalist was called for admission for uncontrolled seizures.   Review of Systems:    Pertinent positives include: Unable to obtain as patient is poorly responsive Past Medical History: Past Medical History  Diagnosis Date  . CVA 1998    "mini" cva, no residual deficts  . ARTHRITIS   . URINARY INCONTINENCE   . DEPRESSION   . DDD (degenerative disc disease)     Cervical spine & knees  . GERD   . DYSLIPIDEMIA   . HYPERTENSION   . HYPOTHYROIDISM   . PEPTIC ULCER DISEASE 11/2009  . Osteoporosis   . CARCINOMA, THYROID GLAND, PAPILLARY 09/2008    s/p total thyroidectomy  . Glioblastoma   . CKD (chronic kidney disease) stage 2, GFR 60-89 ml/min   . Radiation 10/15/13-11/27/13    right frontal brain 42 gray   Past Surgical History  Procedure Laterality Date  . Cholecystectomy    . Abdominal hysterectomy    . Thyroidectomy  2/30/10    papillary, s/p total  . Back surgery    . Tonsillectomy    . Appendectomy    . Incontinence surgery    . Wrist surgery      LEFT  . Cardiovascular stress test  01/27/2003    NO EVIDENCE OF ISCHEMIA. EF 74%  . US echocardiography  03/16/2009    EF 55-60%  . US echocardiography  01/26/2003    EF 60-65%  . Craniotomy Right 08/13/2013    Procedure: RIGHT FRONTAL CRANIOTOMY FOR RESECTION OF TUMOR;  Surgeon: Consuella Lose, MD;  Location: [facility];  Service: Neurosurgery;  Laterality: Right;  right  . Bladder suspension    .  Breast biopsy    . Cardiac catheterization  03/30/2005    NORMAL EF. NORMAL CORONARIES     Medications: Prior to Admission medications   Medication Sig Start Date End Date Taking? Authorizing Provider  acetaminophen (TYLENOL) 325 MG tablet Take 650 mg by mouth every 4 (four) hours as needed for moderate pain or fever.   Yes Historical Provider, MD  ALPRAZolam (XANAX) 0.25 MG tablet Take 0.25 mg by mouth every 6 (six) hours as needed for anxiety.   Yes Historical Provider, MD  ALPRAZolam Duanne Moron) 0.5 MG tablet Take  0.5 mg by mouth at bedtime as needed for sleep.  07/14/14  Yes Historical Provider, MD  aluminum-magnesium hydroxide-simethicone (MAALOX) 786-767-20 MG/5ML SUSP Take 30 mLs by mouth 4 (four) times daily as needed (for heartburn).   Yes Historical Provider, MD  dexamethasone (DECADRON) 4 MG tablet Take 2 mg by mouth 2 (two) times daily. 07/14/14  Yes Historical Provider, MD  guaifenesin (ROBITUSSIN) 100 MG/5ML syrup Take 200-400 mg by mouth every 4 (four) hours as needed for cough.   Yes Historical Provider, MD  HYDROcodone-acetaminophen (NORCO/VICODIN) 5-325 MG per tablet Take 1 tablet by mouth every 6 (six) hours as needed for severe pain. for pain 07/14/14  Yes Historical Provider, MD  LORazepam (ATIVAN) 2 MG/ML concentrated solution Take 1 mg by mouth every 4 (four) hours.   Yes Historical Provider, MD  omeprazole (PRILOSEC) 20 MG capsule Take 20 mg by mouth daily. 07/14/14  Yes Historical Provider, MD  phenytoin (DILANTIN) 100 MG ER capsule Take 100 mg by mouth 3 (three) times daily.   Yes Historical Provider, MD  PREPARATION H 0.25-3-85.5 % suppository Place 1 suppository rectally 4 (four) times daily as needed for hemorrhoids.  07/14/14  Yes Historical Provider, MD  prochlorperazine (COMPAZINE) 10 MG tablet Take 10 mg by mouth every 6 (six) hours as needed for nausea.  07/14/14  Yes Historical Provider, MD  senna-docusate (SENOKOT-S) 8.6-50 MG per tablet Take 1 tablet by mouth 2 (two) times daily.   Yes Historical Provider, MD    Allergies:   Allergies  Allergen Reactions  . Iodine     REACTION: Nausea    Social History:  Shouldn't was recently residing at [facility] not home with family who wishes for her to be discharged back to [place] place   reports that she has never smoked. She has never used smokeless tobacco. She reports that she does not drink alcohol or use illicit drugs.    Family History: family history includes Arthritis in her father and mother; Breast cancer in her  maternal grandmother; Cancer in her sister; Diabetes in her father and mother; Hyperlipidemia in her father and mother.    Physical Exam: Patient Vitals for the past 24 hrs:  BP Temp Temp src Pulse Resp SpO2  07/17/14 1931 (!) 75/49 mmHg - - 64 15 100 %  07/17/14 1900 103/66 mmHg - - 61 16 100 %  07/17/14 1830 107/63 mmHg - - 61 15 100 %  07/17/14 1820 111/74 mmHg - - - 19 -  07/17/14 1710 - - - - - 100 %  07/17/14 1709 113/69 mmHg 99.1 F (37.3 C) Rectal 65 22 (!) 87 %  07/17/14 1700 - - - 72 17 97 %  07/17/14 1657 - - - - - 95 %  07/17/14 1648 113/69 mmHg - - 71 - 92 %    1. General:  in No Acute distress 2. Psychological: Patient is somnolent postictal not following commands  3. Head/ENT:   Moist Mucous Membranes tongue with evidence of biting                          Head Non traumatic, neck supple                          NormalDentition 4. SKIN: normal  Skin turgor,  Skin clean Dry and intact no rash 5. Heart: Regular rate and rhythm no Murmur, Rub or gallop 6. Lungs: no wheezes or crackles , poor exam secondary to patient not being able to cooperate 7. Abdomen: Soft, non-tender, Non distended 8. Lower extremities: no clubbing, cyanosis, or edema 9. Neurologically unable to perform thorough neurological exam as patient is postictal  10. MSK: Normal range of motion  body mass index is unknown because there is no weight on file.   Labs on Admission:   Results for orders placed or performed during the hospital encounter of 07/17/14 (from the past 24 hour(s))  CBC with Differential     Status: Abnormal   Collection Time: 07/17/14  5:41 PM  Result Value Ref Range   WBC 11.0 (H) 4.0 - 10.5 K/uL   RBC 3.12 (L) 3.87 - 5.11 MIL/uL   Hemoglobin 10.2 (L) 12.0 - 15.0 g/dL   HCT 30.5 (L) 36.0 - 46.0 %   MCV 97.8 78.0 - 100.0 fL   MCH 32.7 26.0 - 34.0 pg   MCHC 33.4 30.0 - 36.0 g/dL   RDW 18.5 (H) 11.5 - 15.5 %   Platelets 198 150 - 400 K/uL   Neutrophils Relative % 80 (H) 43  - 77 %   Neutro Abs 8.7 (H) 1.7 - 7.7 K/uL   Lymphocytes Relative 15 12 - 46 %   Lymphs Abs 1.7 0.7 - 4.0 K/uL   Monocytes Relative 5 3 - 12 %   Monocytes Absolute 0.5 0.1 - 1.0 K/uL   Eosinophils Relative 0 0 - 5 %   Eosinophils Absolute 0.0 0.0 - 0.7 K/uL   Basophils Relative 0 0 - 1 %   Basophils Absolute 0.0 0.0 - 0.1 K/uL  Basic metabolic panel     Status: Abnormal   Collection Time: 07/17/14  5:41 PM  Result Value Ref Range   Sodium 136 (L) 137 - 147 mEq/L   Potassium 4.4 3.7 - 5.3 mEq/L   Chloride 99 96 - 112 mEq/L   CO2 24 19 - 32 mEq/L   Glucose, Bld 84 70 - 99 mg/dL   BUN 23 6 - 23 mg/dL   Creatinine, Ser 1.26 (H) 0.50 - 1.10 mg/dL   Calcium 9.8 8.4 - 10.5 mg/dL   GFR calc non Af Amer 38 (L) >90 mL/min   GFR calc Af Amer 44 (L) >90 mL/min   Anion gap 13 5 - 15    UA hasn't been obtained, will order  Lab Results  Component Value Date   HGBA1C 5.7 04/24/2013    CrCl cannot be calculated (Unknown ideal weight.).  BNP (last 3 results) No results for input(s): PROBNP in the last 8760 hours.  There were no vitals filed for this visit.   Cultures:    Component Value Date/Time   SDES URINE, CATHETERIZED 11/25/2013 1859   SPECREQUEST NONE 11/25/2013 1859   CULT NO GROWTH Performed at [facility] 11/25/2013 1859   REPTSTATUS 11/27/2013 FINAL 11/25/2013 1859     Radiological Exams on Admission: Ct Head  Wo Contrast  07/17/2014   CLINICAL DATA:  Previous CVA.  Known brain tumor with recurrence.  EXAM: CT HEAD WITHOUT CONTRAST  TECHNIQUE: Contiguous axial images were obtained from the base of the skull through the vertex without intravenous contrast.  COMPARISON:  MRI 11/18/2013  FINDINGS: Postsurgical changes noted in the right frontal lobe. Extensive abnormal low-density/ edema throughout the white matter bilaterally. This is most pronounced in the right frontal lobe in the area of prior surgery. I cannot exclude a small lesion in the right frontal lobe on  this unenhanced study. There is no mass effect or midline shift. No hydrocephalus.  IMPRESSION: Extensive white matter disease bilaterally. This is most pronounced in the right frontal lobe in the area prior surgery and previous brain lesion. I cannot exclude a recurrent or residual lesion in the right frontal lobe on this unenhanced study. Consider further evaluation with MRI.   Electronically Signed   By: Rolm Baptise M.D.   On: 07/17/2014 18:25    Chart has been reviewed  Assessment/Plan  80 year old female with known history of GBM currently on hospice was recently residing at [facility] place patient was moved to home for unclear reason. Per daughter patient wished to go home. Her seizure medications have been discontinued for unclear reason patient here with recurrent seizures. Family patient is continued to be comfort care but her daughter would like her seizures to be under control.  Present on Admission:  . Essential hypertension - currently hypotensive likely as a result of Dilantin IV blood pressure is not improved after IV bolus will continue to monitor  . Glioblastoma multiforme of brain  - patient has been on hospice with concentration on comfort, patient is to be DO NOT RESUSCITATE DO NOT INTUBATE Recurrent seizures - patient was originally given Dilantin in the emergency department but developed hypotension. We'll switch to Keppra as she has been taken that in the past although patient have had some trouble with compliance prior to arrival to [facility].  Leukocytosis - mild likely as a result of recent seizures. Will obtain UA to evaluate for any source of infection. Patient is afebrile no respiratory complaints. Per family patient is DO NOT RESUSCITATE DO NOT INTUBATE concentrate on comfort care. Prophylaxis: SCD , Protonix  CODE STATUS: DNR/DNI Comfort Care only okay to treat seizures and infection with conservative measures Social work consult to clarify his social situation.  Daughter currently at bedside seem to be concerned with patient's well-being. Per records they have been some problems in the past with a family substance abuse and interfering with patient's care. Other plan as per orders.  I have spent a total of 65 min on this admission except and was taken to discuss care with neurology  [place] 07/17/2014, 7:50 PM  [facility]  Pager [phone]   after 2 AM please page floor coverage PA If 7AM-7PM, please contact the day team taking care of the patient  [email or web address]  Password [email or web address]

## 2014-07-17 NOTE — ED Notes (Signed)
Bed: WA17 Expected date: 07/17/14 Expected time:  Means of arrival:  Comments: Ca pt, seizure

## 2014-07-17 NOTE — ED Notes (Signed)
Patient transported to CT 

## 2014-07-17 NOTE — Progress Notes (Addendum)
Pt admitted from ED to 1345, no family present with pt at this time. Pt sedated, doesn't open eyes to voice or pain, but does groan. Seizure precautions initiated per order. Hortencia Conradi RN

## 2014-07-17 NOTE — ED Provider Notes (Signed)
CSN: [id number]     Arrival date & time 07/17/14  1647 History   First MD Initiated Contact with Patient 07/17/14 1752     Chief Complaint  Patient presents with  . Seizures      HPI  Patient presents via EMS from her daughter's home with a history of multiple recent seizures. Patient has history of glioblastoma. Underwent resection and 4 course or radiation therapy. She declined any further therapy. August was diagnosed with a CNS recurrence. Was at a hospice facility for over 6 weeks. Had been relatively stable without obvious disease progression there. On Wednesday, 3 days ago, the daughter elected to bring her mother home with home hospice. Unfortunately she started having seizures Wednesday night and Thursday morning. Her physician's called her in a prescription for Dilantin. She was able to start on that only today. Continued having multiple seizures per day. Had 3 seizures today. Daughter describes generalized seizures. She does awaken in between. However daughter states that "the more she has, the more tired she seems".  Past Medical History  Diagnosis Date  . CVA 1998    "mini" cva, no residual deficts  . ARTHRITIS   . URINARY INCONTINENCE   . DEPRESSION   . DDD (degenerative disc disease)     Cervical spine & knees  . GERD   . DYSLIPIDEMIA   . HYPERTENSION   . HYPOTHYROIDISM   . PEPTIC ULCER DISEASE 11/2009  . Osteoporosis   . CARCINOMA, THYROID GLAND, PAPILLARY 09/2008    s/p total thyroidectomy  . Glioblastoma   . CKD (chronic kidney disease) stage 2, GFR 60-89 ml/min   . Radiation 10/15/13-11/27/13    right frontal brain 42 gray   Past Surgical History  Procedure Laterality Date  . Cholecystectomy    . Abdominal hysterectomy    . Thyroidectomy  2/30/10    papillary, s/p total  . Back surgery    . Tonsillectomy    . Appendectomy    . Incontinence surgery    . Wrist surgery      LEFT  . Cardiovascular stress test  01/27/2003    NO EVIDENCE OF ISCHEMIA. EF 74%  .  US echocardiography  03/16/2009    EF 55-60%  . US echocardiography  01/26/2003    EF 60-65%  . Craniotomy Right 08/13/2013    Procedure: RIGHT FRONTAL CRANIOTOMY FOR RESECTION OF TUMOR;  Surgeon: Consuella Lose, MD;  Location: [place] [facility];  Service: Neurosurgery;  Laterality: Right;  right  . Bladder suspension    . Breast biopsy    . Cardiac catheterization  03/30/2005    NORMAL EF. NORMAL CORONARIES   Family History  Problem Relation Age of Onset  . Arthritis Mother   . Diabetes Mother   . Hyperlipidemia Mother   . Arthritis Father   . Diabetes Father   . Hyperlipidemia Father   . Breast cancer Maternal Grandmother   . Cancer Sister     breast   History  Substance Use Topics  . Smoking status: Never Smoker   . Smokeless tobacco: Never Used     Comment: widowed, lives with daughter "who is an addict" per pt. Retired, enjoying gardening  . Alcohol Use: No   OB History    No data available     Review of Systems  Constitutional: Negative for fever, chills, diaphoresis, appetite change and fatigue.  HENT: Negative for mouth sores, sore throat and trouble swallowing.   Eyes: Negative for visual disturbance.  Respiratory: Negative  for cough, chest tightness, shortness of breath and wheezing.   Cardiovascular: Negative for chest pain.  Gastrointestinal: Negative for nausea, vomiting, abdominal pain, diarrhea and abdominal distention.  Endocrine: Negative for polydipsia, polyphagia and polyuria.  Genitourinary: Negative for dysuria, frequency and hematuria.  Musculoskeletal: Negative for gait problem.  Skin: Negative for color change, pallor and rash.  Neurological: Positive for seizures. Negative for dizziness, syncope, light-headedness and headaches.  Hematological: Does not bruise/bleed easily.  Psychiatric/Behavioral: Negative for behavioral problems and confusion.      Allergies  Iodine  Home Medications   Prior to Admission medications   Medication Sig Start  Date End Date Taking? Authorizing Provider  ALPRAZolam (XANAX) 0.25 MG tablet Take 0.25 mg by mouth every 6 (six) hours as needed for anxiety.   Yes Historical Provider, MD  ALPRAZolam Duanne Moron) 0.5 MG tablet Take 0.5 mg by mouth at bedtime as needed for sleep.  07/14/14  Yes Historical Provider, MD  aluminum-magnesium hydroxide-simethicone (MAALOX) 673-419-37 MG/5ML SUSP Take 30 mLs by mouth 4 (four) times daily as needed (for heartburn).   Yes Historical Provider, MD  dexamethasone (DECADRON) 4 MG tablet Take 2 mg by mouth 2 (two) times daily. 07/14/14  Yes Historical Provider, MD  guaifenesin (ROBITUSSIN) 100 MG/5ML syrup Take 200-400 mg by mouth every 4 (four) hours as needed for cough.   Yes Historical Provider, MD  HYDROcodone-acetaminophen (NORCO/VICODIN) 5-325 MG per tablet Take 1 tablet by mouth every 6 (six) hours as needed for severe pain. for pain 07/14/14  Yes Historical Provider, MD  omeprazole (PRILOSEC) 20 MG capsule Take 20 mg by mouth daily. 07/14/14  Yes Historical Provider, MD  prochlorperazine (COMPAZINE) 10 MG tablet Take 10 mg by mouth every 6 (six) hours as needed for nausea.  07/14/14  Yes Historical Provider, MD  PREPARATION H 0.25-3-85.5 % suppository Place 1 suppository rectally 4 (four) times daily as needed for hemorrhoids.  07/14/14   Historical Provider, MD   BP 107/63 mmHg  Pulse 61  Temp(Src) 99.1 F (37.3 C) (Rectal)  Resp 15  SpO2 100% Physical Exam  Constitutional: She is oriented to person, place, and time. She appears well-developed and well-nourished. No distress.  Sleeping. Awakens to voice. Answers simple questions yes or no.  HENT:  Head: Normocephalic.  Eyes: Conjunctivae are normal. Pupils are equal, round, and reactive to light. No scleral icterus.  Neck: Normal range of motion. Neck supple. No thyromegaly present.  Cardiovascular: Normal rate and regular rhythm.  Exam reveals no gallop and no friction rub.   No murmur heard. Pulmonary/Chest:  Effort normal and breath sounds normal. No respiratory distress. She has no wheezes. She has no rales.  Abdominal: Soft. Bowel sounds are normal. She exhibits no distension. There is no tenderness. There is no rebound.  Musculoskeletal: Normal range of motion.  Neurological: She is alert and oriented to person, place, and time.  Moves all 4 extremities. No obvious Todd's paresis.  Skin: Skin is warm and dry. No rash noted.  Psychiatric: She has a normal mood and affect. Her behavior is normal.    ED Course  Procedures (including critical care time) Labs Review Labs Reviewed  CBC WITH DIFFERENTIAL - Abnormal; Notable for the following:    WBC 11.0 (*)    RBC 3.12 (*)    Hemoglobin 10.2 (*)    HCT 30.5 (*)    RDW 18.5 (*)    Neutrophils Relative % 80 (*)    Neutro Abs 8.7 (*)    All other  components within normal limits  BASIC METABOLIC PANEL - Abnormal; Notable for the following:    Sodium 136 (*)    Creatinine, Ser 1.26 (*)    GFR calc non Af Amer 38 (*)    GFR calc Af Amer 44 (*)    All other components within normal limits  PHENYTOIN LEVEL, TOTAL    Imaging Review Ct Head Wo Contrast  07/17/2014   CLINICAL DATA:  Previous CVA.  Known brain tumor with recurrence.  EXAM: CT HEAD WITHOUT CONTRAST  TECHNIQUE: Contiguous axial images were obtained from the base of the skull through the vertex without intravenous contrast.  COMPARISON:  MRI 11/18/2013  FINDINGS: Postsurgical changes noted in the right frontal lobe. Extensive abnormal low-density/ edema throughout the white matter bilaterally. This is most pronounced in the right frontal lobe in the area of prior surgery. I cannot exclude a small lesion in the right frontal lobe on this unenhanced study. There is no mass effect or midline shift. No hydrocephalus.  IMPRESSION: Extensive white matter disease bilaterally. This is most pronounced in the right frontal lobe in the area prior surgery and previous brain lesion. I cannot exclude  a recurrent or residual lesion in the right frontal lobe on this unenhanced study. Consider further evaluation with MRI.   Electronically Signed   By: Rolm Baptise M.D.   On: 07/17/2014 18:25     EKG Interpretation None      MDM   Final diagnoses:  Seizure    Given a 1000 mg IV load of Dilantin. CT scan shows probable CNS recurrence. No marked edema on CT. No new hemorrhage.  Daughter had discussed with the hospice facility her mother returning. I would like to ensure that she has good seizure control. I will speak with hospitalist regarding admission/observation tonight.Tanna Furry, MD 07/17/14 [phone]

## 2014-07-18 DIAGNOSIS — D72829 Elevated white blood cell count, unspecified: Secondary | ICD-10-CM | POA: Diagnosis present

## 2014-07-18 DIAGNOSIS — R569 Unspecified convulsions: Secondary | ICD-10-CM

## 2014-07-18 LAB — CBC
HCT: 28.8 % — ABNORMAL LOW (ref 36.0–46.0)
Hemoglobin: 9.7 g/dL — ABNORMAL LOW (ref 12.0–15.0)
MCH: 32.8 pg (ref 26.0–34.0)
MCHC: 33.7 g/dL (ref 30.0–36.0)
MCV: 97.3 fL (ref 78.0–100.0)
PLATELETS: 191 10*3/uL (ref 150–400)
RBC: 2.96 MIL/uL — AB (ref 3.87–5.11)
RDW: 18.1 % — ABNORMAL HIGH (ref 11.5–15.5)
WBC: 11.3 10*3/uL — ABNORMAL HIGH (ref 4.0–10.5)

## 2014-07-18 LAB — COMPREHENSIVE METABOLIC PANEL
ALK PHOS: 79 U/L (ref 39–117)
ALT: 18 U/L (ref 0–35)
AST: 18 U/L (ref 0–37)
Albumin: 3.1 g/dL — ABNORMAL LOW (ref 3.5–5.2)
Anion gap: 16 — ABNORMAL HIGH (ref 5–15)
BUN: 22 mg/dL (ref 6–23)
CALCIUM: 9.3 mg/dL (ref 8.4–10.5)
CO2: 20 meq/L (ref 19–32)
Chloride: 99 mEq/L (ref 96–112)
Creatinine, Ser: 1.07 mg/dL (ref 0.50–1.10)
GFR, EST AFRICAN AMERICAN: 54 mL/min — AB (ref >90)
GFR, EST NON AFRICAN AMERICAN: 46 mL/min — AB (ref >90)
GLUCOSE: 110 mg/dL — AB (ref 70–99)
Potassium: 4.1 mEq/L (ref 3.7–5.3)
Sodium: 135 mEq/L — ABNORMAL LOW (ref 137–147)
Total Bilirubin: 0.9 mg/dL (ref 0.3–1.2)
Total Protein: 6.2 g/dL (ref 6.0–8.3)

## 2014-07-18 MED ORDER — LORAZEPAM 2 MG/ML IJ SOLN: 1.0000 mg | Freq: Four times a day (QID) | INTRAMUSCULAR | Status: DC | PRN

## 2014-07-18 MED ORDER — DEXAMETHASONE SODIUM PHOSPHATE 4 MG/ML IJ SOLN
2.0000 mg | Freq: Two times a day (BID) | INTRAMUSCULAR | Status: DC
  Administered 2014-07-18 – 2014-07-21 (×7): 2 mg via INTRAVENOUS
  Filled 2014-07-18 (×10): qty 0.5

## 2014-07-18 MED ORDER — CETYLPYRIDINIUM CHLORIDE 0.05 % MT LIQD
7.0000 mL | Freq: Two times a day (BID) | OROMUCOSAL | Status: DC
  Administered 2014-07-20 (×2): 7 mL via OROMUCOSAL

## 2014-07-18 MED ORDER — CHLORHEXIDINE GLUCONATE 0.12 % MT SOLN
15.0000 mL | Freq: Two times a day (BID) | OROMUCOSAL | Status: DC
  Administered 2014-07-18 – 2014-07-20 (×4): 15 mL via OROMUCOSAL
  Filled 2014-07-18 (×8): qty 15

## 2014-07-18 NOTE — Clinical Social Work Psychosocial (Cosign Needed)
Clinical Social Work Department BRIEF PSYCHOSOCIAL ASSESSMENT 07/18/2014  Patient:  [name], [name]     Account Number:  [id number]     [place] date:  07/17/2014  Clinical Social Worker:  Oretha Ellis, CLINICAL SOCIAL WORKER  Date/Time:  07/18/2014 03:01 PM  Referred by:  Physician  Date Referred:  07/18/2014  Other Referral:   Interview type:  Other - See comment Other interview type:   Patient was unresponsive at time of visit. CSW Intern communicated with Hospice weekend Social Worker, Katheren Puller, in regards to patient discharge plan.    PSYCHOSOCIAL DATA Living Status:  FAMILY Admitted from facility:   Level of care:   Primary support name:  Merrianne Mccumbers Primary support relationship to patient:  CHILD, ADULT Degree of support available:   Patient's status is confidential. Per Hospice Weekend SW (HSW), [place], there is extensive history with this family. Patient's daughter [name], is estranged from patient due to conflictual relationship with sister, [name]. Per HSW Lonon, both daughters were informed that patient is in the hospital, but were not made aware of patient's location. Also, per HSW Lonon both daughters are in agreement with patient being discharged to SNF. HSW Lonon had limited information about this case, but the patient's primary Hospice Social Worker, Marilynne Halsted, and her [facility] nurse liaison, Ronn Melena, have more information and will assist with discharge planning. Both can be reached at [phone].    CURRENT CONCERNS Current Concerns  Other - See comment   Other Concerns:   Patient status is confidential and, per HSW Lonon, there is an active Adult Protective Services case open with [facility].    SOCIAL WORK ASSESSMENT / PLAN CSW Intern unable to speak with pt, as she was [place]. CSW Intern spoke with Hospice Social Worker, [employer]. She stated the plan for discharge is SNF and daughters are both in agreement with  that plan. CSW to complete FL-2 and initiate SNF bed search for bed offers. CSW to follow up with patient to provide available bed offers. CSW remains available for support and to facilitate patient discharge need once medically ready.   Assessment/plan status:  Psychosocial Support/Ongoing Assessment of Needs Other assessment/ plan:   Information/referral to community resources:    PATIENT'S/FAMILY'S RESPONSE TO PLAN OF CARE: Patient was unresponsive. No family at bedside. Hospice Social Worker involved and able to assist with SNF placement. Patient's daughters were informed that patient in the hospital.

## 2014-07-18 NOTE — Progress Notes (Signed)
Paged MD regarding pts inability to take meds PO and collection of UA C&S. Received orders to change decadron and ativan to IV route and to hold off on I&O cath for sample since pt is comfort care at this point. Hortencia Conradi RN

## 2014-07-18 NOTE — Progress Notes (Signed)
Patient was in bed, resting comfortably. She opened her eyes when social worker called her name. She denied pain, but made no other verbalizations before drifting off the sleep. Patient's status is confidential. Staff report that patient's daughters have called. SW did speak with Carmen(daughter) this morning and she is aware that was hospitalized. The family is seeking SNF placement for patient. Primary MSW, Marilynne Halsted will be updated for follow-up. [facility] nurse liaison, Ronn Melena will follow patient and assist with discharge planning. Please call [phone] for any questions or changes in patient's condition.  Kirsten Lonon,LCSW [phone] cell)

## 2014-07-18 NOTE — Progress Notes (Signed)
No family has arrived thus far to assist with completion of admission questions and vaccine screening. Pt cognitively unable to participate in this process. Hortencia Conradi RN

## 2014-07-18 NOTE — Progress Notes (Addendum)
Progress Note   [name] LFY:[id number] DOB: 02-25-1928 DOA: 07/17/2014 PCP: [name], MD   Brief Narrative:   [name] is an 80 y.o. female with a PMH of GBM status post resection 07/2013 followed by radiation therapy, currently under hospice care at home (previously resided at [facility] but was d/c'd home 07/14/14), whose antiseizure medications were discontinued upon discharge from [facility] who was admitted on 07/17/14 with grand mal seizures.the patient was given IV Dilantin in the ED and subsequently became hypotensive.  She was admitted for seizure control.  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  No reports of seizures since admission.  Active Problems:   Leukocytosis  F/U U/A.    Essential hypertension  Not on medication.    DVT Prophylaxis  Not ordered, patient is comfort care only.  Code Status: DNR Family Communication: No family at the bedside.  Carmen's # has been disconnected.  Laura's home # has been disconnected.  Spoke with Mickel Baas by telephone (daughter she lives with). Disposition Plan: Home vs [place] when stable.   IV Access:    Peripheral IV   Procedures and diagnostic studies:   Ct Head Wo Contrast 07/17/2014: Extensive white matter disease bilaterally. This is most pronounced in the right frontal lobe in the area prior surgery and previous brain lesion. I cannot exclude a recurrent or residual lesion in the right frontal lobe on this unenhanced study. Consider further evaluation with MRI.    Medical Consultants:    None.  Anti-Infectives:    None.  Subjective:    [name] is minimally responsive, occasionally gives one word responses, but no specific complaints.  Objective:    Filed Vitals:   07/17/14 2000 07/17/14 2030 07/17/14 2152 07/18/14 0608  BP: 75/50  108/67 104/60 90/42  Pulse: 67  61 62  Temp:   97.7 F (36.5 C) 97.7 F (36.5 C)  TempSrc:   Axillary Axillary  Resp: 14 14 15 15   SpO2: 99%  98% 99%   No intake or output data in the 24 hours ending 07/18/14 0754  Exam: Gen:  NAD Cardiovascular:  RRR, No M/R/G Respiratory:  Lungs CTAB Gastrointestinal:  Abdomen soft, NT/ND, + BS Extremities:  No C/E/C   Data Reviewed:    Labs: Basic Metabolic Panel:  Recent Labs Lab 07/17/14 1741 07/18/14 0506  NA 136* 135*  K 4.4 4.1  CL 99 99  CO2 24 20  GLUCOSE 84 110*  BUN 23 22  CREATININE 1.26* 1.07  CALCIUM 9.8 9.3   GFR CrCl cannot be calculated (Unknown ideal weight.). Liver Function Tests:  Recent Labs Lab 07/18/14 0506  AST 18  ALT 18  ALKPHOS 79  BILITOT 0.9  PROT 6.2  ALBUMIN 3.1*   CBC:  Recent Labs Lab 07/17/14 1741 07/18/14 0506  WBC 11.0* 11.3*  NEUTROABS 8.7*  --   HGB 10.2* 9.7*  HCT 30.5* 28.8*  MCV 97.8 97.3  PLT 198 191   Sepsis Labs:  Recent Labs Lab 07/17/14 1741 07/18/14 0506  WBC 11.0* 11.3*   Microbiology No results found for this or any previous visit (from the past 240 hour(s)).   Medications:   . dexamethasone  2 mg Intravenous Q12H  . docusate sodium  100 mg Oral BID  . levETIRAcetam  500 mg Intravenous Q12H  . pantoprazole  40 mg Oral Daily  Continuous Infusions:   Time spent: 25 minutes.   LOS: 1 day   Aunica Dauphinee  [facility] Pager [phone]. If unable to reach me by pager, please call my cell phone at [phone].  *Please refer to [email or web address], password [email or web address] to get updated schedule on who will round on this patient, as hospitalists switch teams weekly. If 7PM-7AM, please contact night-coverage at [email or web address], password [email or web address] for any overnight needs.  07/18/2014, 7:54 AM

## 2014-07-18 NOTE — Progress Notes (Signed)
GIP visit [facility] Rm 1345 [facility] RN  Related Admission with hospice diagnosis of Glioblastoma. Patient is a DNR. Spoke with nurse Maudie Mercury and since admission to the floor patient has had no seizures. Patient is confidential at the hospital so the daughters are unable to get any information. Daughters continue to call. Arrived into patients room and patient did not respond to verbal stimuli. Patient is getting IV Keppra. No family at bedside. [place] with hospice arrived with patient. Patient is total care at the hospital and continuing comfort care.  Please call with any hospice concerns [phone]

## 2014-07-19 DIAGNOSIS — Z609 Problem related to social environment, unspecified: Secondary | ICD-10-CM

## 2014-07-19 DIAGNOSIS — Z659 Problem related to unspecified psychosocial circumstances: Secondary | ICD-10-CM

## 2014-07-19 NOTE — Progress Notes (Signed)
Progress Note   [name] IWP:[id number] DOB: 11-Oct-1927 DOA: 07/17/2014 PCP: [name], MD   Brief Narrative:   [name] is an 80 y.o. female with a PMH of GBM status post resection 07/2013 followed by radiation therapy, currently under hospice care at home (previously resided at [facility] but was d/c'd home 07/14/14), whose antiseizure medications were discontinued upon discharge from [facility] who was admitted on 07/17/14 with grand mal seizures.the patient was given IV Dilantin in the ED and subsequently became hypotensive.  She was admitted for seizure control.  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  No reports of seizures since admission.  MRI brain r/o anoxic brain injury given multiple seizure events.  Advance diet.  Active Problems:   Poor social situation  Daughter, Mickel Baas (who she lives with) has been accused of neglect, SW has had to help family with provision of food/utilities to the patient.  Asencion Partridge (other daughter) and Mickel Baas do not speak to each other.  DSS/APS involved.    Leukocytosis  F/U U/A.    Essential hypertension  Not on medication.    DVT Prophylaxis  Not ordered, patient is comfort care only.  Code Status: DNR Family Communication: No family at the bedside.  Carmen's # has been disconnected.  Spoke with Mickel Baas by telephone (daughter she lives with). Disposition Plan: Home vs [place] when stable.   IV Access:    Peripheral IV   Procedures and diagnostic studies:   Ct Head Wo Contrast 07/17/2014: Extensive white matter disease bilaterally. This is most pronounced in the right frontal lobe in the area prior surgery and previous brain lesion. I cannot exclude a recurrent or residual lesion in the right frontal lobe on this unenhanced study. Consider further evaluation with  MRI.    Medical Consultants:    None.  Anti-Infectives:    None.  Subjective:   [name] is a bit more verbal today.  Denies pain.  Denies nausea/vomiting.  Has not had any oral intake since admission.  Objective:    Filed Vitals:   07/18/14 0608 07/18/14 1415 07/18/14 2012 07/19/14 0504  BP: 90/42 112/63 112/55 108/62  Pulse: 62 60 60 59  Temp: 97.7 F (36.5 C) 98.3 F (36.8 C) 98.1 F (36.7 C) 98.1 F (36.7 C)  TempSrc: Axillary Axillary Axillary Axillary  Resp: 15 18 16 16   SpO2: 99% 98% 97% 98%    Intake/Output Summary (Last 24 hours) at 07/19/14 0835 Last data filed at 07/18/14 1805  Gross per 24 hour  Intake      0 ml  Output      0 ml  Net      0 ml    Exam: Gen:  NAD Cardiovascular:  RRR, No M/R/G Respiratory:  Lungs CTAB Gastrointestinal:  Abdomen soft, NT/ND, + BS Extremities:  No C/E/C   Data Reviewed:    Labs: Basic Metabolic Panel:  Recent Labs Lab 07/17/14 1741 07/18/14 0506  NA 136* 135*  K 4.4 4.1  CL 99 99  CO2 24 20  GLUCOSE 84 110*  BUN 23 22  CREATININE 1.26* 1.07  CALCIUM 9.8 9.3   GFR CrCl cannot be calculated (Unknown ideal weight.). Liver Function Tests:  Recent Labs Lab 07/18/14 0506  AST 18  ALT 18  ALKPHOS 79  BILITOT 0.9  PROT 6.2  ALBUMIN 3.1*   CBC:  Recent Labs Lab 07/17/14 1741 07/18/14 0506  WBC 11.0* 11.3*  NEUTROABS 8.7*  --   HGB 10.2* 9.7*  HCT 30.5* 28.8*  MCV 97.8 97.3  PLT 198 191   Sepsis Labs:  Recent Labs Lab 07/17/14 1741 07/18/14 0506  WBC 11.0* 11.3*   Microbiology No results found for this or any previous visit (from the past 240 hour(s)).   Medications:   . antiseptic oral rinse  7 mL Mouth Rinse q12n4p  . chlorhexidine  15 mL Mouth Rinse BID  . dexamethasone  2 mg Intravenous Q12H  . docusate sodium  100 mg Oral BID  . levETIRAcetam  500 mg Intravenous Q12H  . pantoprazole  40 mg Oral Daily   Continuous Infusions:   Time spent: 25  minutes.   LOS: 2 days   Everlyn Farabaugh  [facility] Pager [phone]. If unable to reach me by pager, please call my cell phone at [phone].  *Please refer to [email or web address], password [email or web address] to get updated schedule on who will round on this patient, as hospitalists switch teams weekly. If 7PM-7AM, please contact night-coverage at [email or web address], password [email or web address] for any overnight needs.  07/19/2014, 8:35 AM

## 2014-07-19 NOTE — Evaluation (Signed)
Physical Therapy Evaluation Patient Details Name: [name] MRN: [id number] DOB: 09-21-1927 Today's Date: 07/19/2014   History of Present Illness  Pt is an 80 year old female admitted with glioblastoma multiforme of brain with resultant seizure disorder/seizures.  Pt was at [facility] however d/c home 11/18 and antiseizure medications were discontinued upon discharge.  Clinical Impression  Pt admitted with seizures. Pt currently with functional limitations due to the deficits listed below (see PT Problem List).  Pt will benefit from skilled PT to increase their independence and safety with mobility to allow discharge to the venue listed below.  Pt arouseable however falls asleep quickly.  Pt agreeable to sit EOB however unable to assist requiring total assist for bed mobility despite multimodal cues.  Recommend increased assist upon d/c.     Follow Up Recommendations SNF;Supervision/Assistance - 24 hour    Equipment Recommendations   (if home, will defer to hospice as no family present)    Recommendations for Other Services       Precautions / Restrictions Precautions Precautions: Fall Precaution Comments: seizure      Mobility  Bed Mobility Overal bed mobility: +2 for physical assistance;Needs Assistance Bed Mobility: Supine to Sit;Sit to Supine     Supine to sit: Total assist;HOB elevated Sit to supine: Total assist   General bed mobility comments: utilized bed positioning to assist due to unavailable +2 assist, pt awake and talking however quickly falls back asleep, mulitmodal cues to have pt assist however pt unable, pt was able to move arms and legs in bed very small amount however not able to follow through with commands and multimodal cues  Transfers                    Ambulation/Gait                Stairs            Wheelchair Mobility    Modified Rankin (Stroke Patients Only)       Balance Overall balance assessment:  Needs assistance Sitting-balance support: No upper extremity supported;Feet unsupported Sitting balance-Leahy Scale: Zero Sitting balance - Comments: pt presents with no trunk control,  total assist                                     Pertinent Vitals/Pain Pain Assessment: No/denies pain    Home Living Family/patient expects to be discharged to:: Private residence   Available Help at Discharge: Family             Additional Comments: pt poor historian however per chart review, pt was d/c home from [facility] with daughter assist    Prior Function Level of Independence: Needs assistance   Gait / Transfers Assistance Needed: unsure of accuracy however pt reports ambulating with RW short distances at home           Hand Dominance        Extremity/Trunk Assessment   Upper Extremity Assessment: Generalized weakness;Difficult to assess due to impaired cognition           Lower Extremity Assessment: Generalized weakness;Difficult to assess due to impaired cognition         Communication   Communication: No difficulties  Cognition Arousal/Alertness: Lethargic (arousable but quickly falls asleep) Behavior During Therapy: Flat affect Overall Cognitive Status: No family/caregiver present to determine baseline cognitive functioning  General Comments      Exercises        Assessment/Plan    PT Assessment Patient needs continued PT services  PT Diagnosis Generalized weakness   PT Problem List Decreased strength;Decreased activity tolerance;Decreased mobility;Decreased balance;Decreased knowledge of use of DME;Decreased cognition  PT Treatment Interventions DME instruction;Patient/family education;Functional mobility training;Therapeutic activities;Therapeutic exercise;Gait training;Balance training;Wheelchair mobility training   PT Goals (Current goals can be found in the Care Plan section) Acute Rehab PT Goals PT  Goal Formulation: Patient unable to participate in goal setting Time For Goal Achievement: 08/02/14 Potential to Achieve Goals: Fair    Frequency Min 2X/week   Barriers to discharge        Co-evaluation               End of Session   Activity Tolerance: Patient limited by lethargy Patient left: in bed;with call bell/phone within reach;with bed alarm set Nurse Communication: Mobility status         Time: 0263-7858 PT Time Calculation (min) (ACUTE ONLY): 13 min   Charges:   PT Evaluation $Initial PT Evaluation Tier I: 1 Procedure PT Treatments $Therapeutic Activity: 8-22 mins   PT G Codes:          [name],[name] 07/19/2014, 12:49 PM [name], PT, DPT 07/19/2014 Pager: [phone]

## 2014-07-19 NOTE — Progress Notes (Signed)
CSW continuing to follow.  CSW met with [facility] SW, Marilynne Halsted, [facility] RN liaison, [name] and Dr. Rockne Menghini to discuss case and discharge planning. Per [facility] SW, pt lives with daughter, Mickel Baas. Pt has two daughter, Mickel Baas and Asencion Partridge whom do not get along. Per [facility] SW, DSS APS is involved secondary to concerns about pt daughter, Mickel Baas mental health issues and drug abuse problems. DSS caseworker is Ernest Pine [phone]). Per [facility] SW, pt returned home on 07/14/14 to daughter, Laura's care after being in [facility] for two and a half months. Per [facility] SW, pt daughters have expressed their wishes to [facility] staff for pt to go to a SNF or [employer].   CSW awaiting further work up including PT evaluation to determine appropriate discharge plan for pt. [facility] SW, plans to discuss with [facility] pt status. Per MD, plan to order MRI.   CSW to continue to follow to determine appropriate disposition planning for pt.   Alison Murray, MSW, [place] [facility] [phone]

## 2014-07-19 NOTE — Progress Notes (Signed)
[facility] MSW note: This is a current hospice home care pt. This is a related hospice admission. Pt is a DNR. Pt was lethargic. Pt did arouse and say hello and denied pain. Pt has not eaten since hospital admission. MSW and RN Mariah Milling made joint visit and met with Dr. Rockne Menghini and Jorene Guest, hospital social worker to discuss case and discharge plans. Daughter [name] lives with pt. Daughters [name] and Kirsten Partridge do no get along. [name] has mental health issues and drug abuse problems. DSS APS is involved. [name] is the contact social worker at [place] [phone]). Pt was at [facility] for about 2.5 months until 11/18. Pt returned home with [name] trying to care for pt. Both daughters have told [facility] staff pt needs to go to a SNF or [facility]. This MSW will continue to work with hospital staff to see appropriate discharge plans for pt. This will MSW will discuss with Fraser Din, Soil scientist at [facility] to discuss pt's status. Dr. Rockne Menghini will order a MRI.   Marilynne Halsted, MSW

## 2014-07-19 NOTE — Progress Notes (Signed)
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  Pt is DNR code.     Pt seen at bedside with [facility] LCSW Marilynne Halsted. Pt with eyes closed, did open her  eyes to voice and was able to respond slowly. She denied pain and was able to say hello to SW, but quickly appeared to be back to sleep and did not interact verbally again . She remained lethargic through out visit, per chart review and discussion with staff RN Joelene Millin, pt has not recvd any sedating medications, last dose of IV ativan was on 11/21.  She has not had any po intake since admission and has been unable d/t lethargy to take po medications as ordered. She is currently recvg IV Keppra and IV decadron for management of seizures, she has had no further seizure activity since admission. Writer and LCSW Marilynne Halsted discussed case with hospital CSW Suzanna and attending physician Dr. Rockne Menghini. Jackelyn Poling was able to relate details regarding patient's current home/family situation and previous stay at  [facility]). At time of visit pt does not appear appropriate for a SNF placement.  Debbie to discuss with [facility] director Vernie Ammons. Writer to provide info to [facility] if needed. Dr. Rockne Menghini plans to order an MRI for evaluation of a possible anoxic event related to patient's multiple seizures reported prior to hospital admission.  [facility] will continue to follow and collaborate with hospital CSW and attending physician regarding symptom management and discharge disposition.  Patient's home medication list and transfer summary in place on shadow chart.  Please call [facility] @ [phone] with any hospice needs.   Thank you. Tracey Harries, RN  [facility]  Hospice Liaison  [phone])

## 2014-07-20 ENCOUNTER — Inpatient Hospital Stay (HOSPITAL_COMMUNITY)

## 2014-07-20 MED ORDER — GADOBENATE DIMEGLUMINE 529 MG/ML IV SOLN
15.0000 mL | Freq: Once | INTRAVENOUS | Status: AC | PRN
  Administered 2014-07-20: 15 mL via INTRAVENOUS

## 2014-07-20 NOTE — Progress Notes (Signed)
Physical Therapy Treatment Patient Details Name: [name] MRN: [id number] DOB: [birth year] Today's Date: 07/20/2014    History of Present Illness Pt is an 80 year old female admitted with glioblastoma multiforme of brain with resultant seizure disorder/seizures.  Pt was at [facility] however d/c home 11/18 and antiseizure medications were discontinued upon discharge.    PT Comments    Pt in bed on 2 lts nasal avg sats 96%.  Raised HOB all the way and assisted from supine to EOB total assist pt 0%.  Pt was not sleepy and conversive just slow to respond.  Poor static sitting balance.  Pt was unable to support self and unable to maintain upright posture.  LOB all planes.  Performed staitc/dynamic sitting balance activity x 3 min.  Returned pt to supine.  Was unable to attempt OOB activity due to low performance level EOB.  Rec Hoyer lift.   Follow Up Recommendations  SNF     Equipment Recommendations       Recommendations for Other Services       Precautions / Restrictions Precautions Precautions: Fall Precaution Comments: seizure Restrictions Weight Bearing Restrictions: No    Mobility  Bed Mobility Overal bed mobility: +2 for physical assistance;Needs Assistance Bed Mobility: Supine to Sit     Supine to sit: +2 for physical assistance;+2 for safety/equipment;Total assist (Pt 0%)     General bed mobility comments: HOB elevated and use of bed pad to assist pt from supine to EOB Total Assist pt 0%.  Pt unable to support self upright and required Total Assist/MAX assist to prevent LOB all planes.  Poor static sitting balane.  Performed dynamic sitting balance activities x 2 min all pt could tolerate.  Unable to use L UE.  AAROM R UE cross midline  but required Total assist to perorm.  Retured to supine Total Assist pt 0%.   Transfers                 General transfer comment: unable to attempt due to poor sitting balance.  Rec Hoyer.  Ambulation/Gait                  Stairs            Wheelchair Mobility    Modified Rankin (Stroke Patients Only)       Balance                                    Cognition                            Exercises      General Comments        Pertinent Vitals/Pain Pain Assessment: No/denies pain    Home Living                      Prior Function            PT Goals (current goals can now be found in the care plan section) Progress towards PT goals: Progressing toward goals (very slowly)    Frequency  Min 2X/week    PT Plan      Co-evaluation             End of Session   Activity Tolerance: Treatment limited secondary to medical complications (Comment) (very weak)  Time: 7939-6886 PT Time Calculation (min) (ACUTE ONLY): 30 min  Charges:  $Therapeutic Activity: 23-37 mins                    G Codes:      Rica Koyanagi  PTA [facility]  Acute  Rehab Pager      [phone]

## 2014-07-20 NOTE — Progress Notes (Signed)
[facility] MSW note: This is a hospice related admission. Joint visit made [name], RN liaison. Pt more alert on visit. However, pt is unreliable with her responses. Pt is slow to respond and answer. Pt could answer that she was at [facility]. However, pt was confused on other information. Pt is having to be fed. Staff CNA reported that pt is not able to suck out of straw. Pt is having to be fed her meals. Pt ate 25% of lunch yesterday. Pt did not participate with PT yesterday. MSW attempted to explore pt's wishes for hospital discharge. Pt was able to say to she did not want snf but open to BP if she met medical eligibility. MSW met with Hughes Better, staff social worker to discuss discharge plans. SNF and BP still being explored to see which is the best option for pt. Please call with questions or concerns.   Marilynne Halsted, MSW

## 2014-07-20 NOTE — Progress Notes (Signed)
CSW continuing to follow.  CSW discussed case with [facility] SW, Marilynne Halsted. [facility] SW plans to discuss with [facility] about eligibility. [facility] SW plans to provide CSW with pt primary insurance information in order for CSW to initiate a SNF search for secondary option if [facility] is not an option at this time.  CSW to continue to follow to assist with pt disposition needs.   Alison Murray, MSW, [place] Work [phone]

## 2014-07-20 NOTE — Care Management Note (Signed)
CARE MANAGEMENT NOTE 07/20/2014  Patient:  [name], [name]   Account Number:  [id number]  Date Initiated:  07/20/2014  Documentation initiated by:  Marney Doctor  Subjective/Objective Assessment:   80 yo admitted with Glioblastoma multiforme of brain     Action/Plan:   from home with daughter Mickel Baas and has [facility] services   Anticipated DC Date:  07/23/2014   Anticipated DC Plan:  [place]  In-house referral  Clinical Social Worker      DC Planning Services  CM consult      Choice offered to / List presented to:             [place]   Status of service:  In process, will continue to follow Medicare Important Message given?   (If response is "NO", the following Medicare IM given date fields will be blank) Date Medicare IM given:   Medicare IM given by:   Date Additional Medicare IM given:   Additional Medicare IM given by:    Discharge Disposition:    Per UR Regulation:  Reviewed for med. necessity/level of care/duration of stay  If discussed at [place] of Stay Meetings, dates discussed:    Comments:  07/20/14 Marney Doctor RN,BSN,NCM [phone] Chart reviewed and CM following to assist with DC needs.

## 2014-07-20 NOTE — Progress Notes (Signed)
Progress Note   [name] DJS:[id number] DOB: 09-Aug-1928 DOA: 07/17/2014 PCP: [name], MD   Brief Narrative:   [name] is an 80 y.o. female with a PMH of GBM status post resection 07/2013 followed by radiation therapy, currently under hospice care at home (previously resided at [facility] but was d/c'd home 07/14/14), whose antiseizure medications were discontinued upon discharge from [facility] who was admitted on 07/17/14 with grand mal seizures.the patient was given IV Dilantin in the ED and subsequently became hypotensive.  She was admitted for seizure control.  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  No reports of seizures since admission.  MRI brain r/o anoxic brain injury given multiple seizure events.  Advance diet.  Active Problems:   Poor social situation  Daughter, [name] (who she lives with) has been accused of neglect, SW has had to help family with provision of food/utilities to the patient.  [name] (other daughter) and Kirsten Baas do not speak to each other.  DSS/APS involved.    Leukocytosis  F/U U/A.    Essential hypertension  Not on medication.    DVT Prophylaxis  Not ordered, patient is comfort care only.  Code Status: DNR Family Communication: No family at the bedside.  Carmen's # has been disconnected.  Spoke with [name] by telephone (daughter she lives with). Disposition Plan: Home vs [place] when stable.   IV Access:    Peripheral IV   Procedures and diagnostic studies:   Ct Head Wo Contrast 07/17/2014: Extensive white matter disease bilaterally. This is most pronounced in the right frontal lobe in the area prior surgery and previous brain lesion. I cannot exclude a recurrent or residual lesion in the right frontal lobe on this unenhanced study. Consider further evaluation with  MRI.    Medical Consultants:    None.  Anti-Infectives:    None.  Subjective:   [name] is more lethargic and disoriented today.  Somnolent on my exam, opens eyes but non-verbal.  Objective:    Filed Vitals:   07/19/14 0504 07/19/14 1500 07/19/14 2200 07/20/14 0706  BP: 108/62 100/60 114/62 118/61  Pulse: 59 62 64 59  Temp: 98.1 F (36.7 C) 98 F (36.7 C) 97.3 F (36.3 C) 97.8 F (36.6 C)  TempSrc: Axillary Axillary Oral Oral  Resp: 16 16 20 20   SpO2: 98% 98% 93% 98%    Intake/Output Summary (Last 24 hours) at 07/20/14 1148 Last data filed at 07/19/14 1626  Gross per 24 hour  Intake     60 ml  Output      0 ml  Net     60 ml    Exam: Gen:  Somnolent Cardiovascular:  RRR, No M/R/G Respiratory:  Lungs CTAB Gastrointestinal:  Abdomen soft, NT/ND, + BS Extremities:  No C/E/C   Data Reviewed:    Labs: Basic Metabolic Panel:  Recent Labs Lab 07/17/14 1741 07/18/14 0506  NA 136* 135*  K 4.4 4.1  CL 99 99  CO2 24 20  GLUCOSE 84 110*  BUN 23 22  CREATININE 1.26* 1.07  CALCIUM 9.8 9.3   GFR CrCl cannot be calculated (Unknown ideal weight.). Liver Function Tests:  Recent Labs Lab 07/18/14 0506  AST 18  ALT 18  ALKPHOS 79  BILITOT 0.9  PROT 6.2  ALBUMIN 3.1*   CBC:  Recent Labs Lab 07/17/14 1741 07/18/14 0506  WBC 11.0* 11.3*  NEUTROABS 8.7*  --   HGB 10.2* 9.7*  HCT 30.5* 28.8*  MCV 97.8 97.3  PLT 198 191   Sepsis Labs:  Recent Labs Lab 07/17/14 1741 07/18/14 0506  WBC 11.0* 11.3*   Microbiology No results found for this or any previous visit (from the past 240 hour(s)).   Medications:   . antiseptic oral rinse  7 mL Mouth Rinse q12n4p  . chlorhexidine  15 mL Mouth Rinse BID  . dexamethasone  2 mg Intravenous Q12H  . docusate sodium  100 mg Oral BID  . levETIRAcetam  500 mg Intravenous Q12H  . pantoprazole  40 mg Oral Daily   Continuous Infusions:   Time spent: 25 minutes.   LOS: 3 days    [facility] Pager [phone]. If unable to reach me by pager, please call my cell phone at [phone].  *Please refer to [email or web address], password [email or web address] to get updated schedule on who will round on this patient, as hospitalists switch teams weekly. If 7PM-7AM, please contact night-coverage at [email or web address], password [email or web address] for any overnight needs.  07/20/2014, 11:48 AM

## 2014-07-20 NOTE — Progress Notes (Addendum)
Inpatient RN visit- [place] 3w Room 1345-[facility] RN Visit-Karen Alford Highland RN  Related admission to [facility] diagnosis of Malignant Neoplasm of cerebrum except lobes and ventricles. Pt is DNR code. Pt seen at bedside with [facility] MSW Marilynne Halsted. Pt more alert than at visit yesterday, was able to stay awake and answer where she was, she does remain very slow to respond. She did have inappropriate answers/statements to other questions at times. Patient, fed by writer, ate a bowl of oatmeal and drank orange juice. She was able to hold the juice cup but could not coordinate tipping the cup and drinking. She was also noted to lean to the right and had to be propped with pillows to stay upright. Coughing noted approximately 15 minutes after finishing with meal, strong cough noted, no audible secretions heard, no dyspnea. Patient denied pain through out visit. Above information shared with staff RN Maudie Mercury, patient was unable to take po medications yesterday but appears to be more able today. Still awaiting MRI, patient's daughter Mickel Baas to be contacted regarding question of patient having any metal in her body prior to the procedure. No family present during visit. Physical therapy attempting to evaluate patient at end of visit. [facility] will continue to follow and collaborate with hospice staff and Attending physician regarding symptom management and discharge planning.   Patient's home medication list, transfer summary and OOF DNR in place  on shadow chart.  Please call [facility] @ [phone]-with any hospice needs.   Thank you. Tracey Harries, RN  [facility]  Hospice Liaison  [phone])

## 2014-07-21 MED ORDER — DEXAMETHASONE SODIUM PHOSPHATE 4 MG/ML IJ SOLN: 2.0000 mg | Freq: Two times a day (BID) | INTRAMUSCULAR | Status: AC

## 2014-07-21 MED ORDER — ACETAMINOPHEN 650 MG RE SUPP: 650.0000 mg | Freq: Four times a day (QID) | RECTAL | Status: AC | PRN

## 2014-07-21 MED ORDER — LEVETIRACETAM IN NACL 500 MG/100ML IV SOLN: 500.0000 mg | Freq: Two times a day (BID) | INTRAVENOUS | Status: AC

## 2014-07-21 MED ORDER — DSS 100 MG PO CAPS: 100.0000 mg | ORAL_CAPSULE | Freq: Two times a day (BID) | ORAL | Status: DC

## 2014-07-21 MED ORDER — SODIUM CHLORIDE 0.9 % IJ SOLN: 10.0000 mL | INTRAMUSCULAR | Status: DC | PRN

## 2014-07-21 MED ORDER — LORAZEPAM 2 MG/ML IJ SOLN: 1.0000 mg | Freq: Four times a day (QID) | INTRAMUSCULAR | Status: AC | PRN

## 2014-07-21 MED ORDER — ONDANSETRON HCL 4 MG/2ML IJ SOLN: 4.0000 mg | Freq: Four times a day (QID) | INTRAMUSCULAR | Status: AC | PRN

## 2014-07-21 MED ORDER — BISACODYL 10 MG RE SUPP: 10.0000 mg | RECTAL | Status: AC | PRN

## 2014-07-21 NOTE — Progress Notes (Signed)
Peripherally Inserted Central Catheter/Midline Placement  The IV Nurse has discussed with the patient and/or persons authorized to consent for the patient, the purpose of this procedure and the potential benefits and risks involved with this procedure.  The benefits include less needle sticks, lab draws from the catheter and patient may be discharged home with the catheter.  Risks include, but not limited to, infection, bleeding, blood clot (thrombus formation), and puncture of an artery; nerve damage and irregular heat beat.  Alternatives to this procedure were also discussed.  PICC/Midline Placement Documentation     Phone consent from daughter   Murvin Natal 07/21/2014, 11:23 AM

## 2014-07-21 NOTE — Progress Notes (Signed)
[facility] MSW note: This is a hospice related admission. Pt was alert on visit. Pt denied pain. Pt does have a [facility] bed offer for today. [facility] is requesting a PICC line. MSW spoke with [place], staff RN yesterday by phone @4 :55pm to discuss [facility] bed offer and request for PICC line. Suzanna, SW agreed to arrange transport to [facility] when appropriate. MSW discussed with pt today [facility] bed offer. Pt is agreeable to [facility] bed. MSW has talked with daughter Asencion Partridge by phone last yesterday afternoon. Daughter-Carmen is agreeable to [facility] at hospital discharge and PICC line. Asencion Partridge stated that she feels pt cannot return home due to caregiver issues and home situation. MSW left voice mail for Daughter-Laura to inform her that pt has a [facility] bed offer. Please call if questions or when pt is ready to be discharged from hospital.   Marilynne Halsted, MSW

## 2014-07-21 NOTE — Progress Notes (Signed)
Pt for discharge to [facility].  CSW received notification from [employer], Marilynne Halsted 07/20/2014 that pt accepted at [facility] and bed available Wednesday 07/21/2014. [facility] SW, Marilynne Halsted, pt agreeable. Per Marilynne Halsted, [facility] requesting PICC line placement for IV medication administration if needed. [facility] SW, Marilynne Halsted had notified RN and MD and PICC line placed this morning. [facility] SW, Marilynne Halsted has spoken to pt daughter, Asencion Partridge and left message for pt daughter, Mickel Baas to notify of pt transfer to [facility].  CSW facilitated pt discharge needs including faxing pt discharge information to [facility], providing RN phone number to call report, and arranging ambulance transport via [place]. [facility] SW, Marilynne Halsted notified of transportation being arranged.   No further social work needs identified at this time.  CSW signing off.  Alison Murray, MSW, [place] Work [phone]

## 2014-07-21 NOTE — Discharge Instructions (Signed)

## 2014-07-21 NOTE — Discharge Summary (Signed)
Physician Discharge Summary  [name] ZJQ:[id number] DOB: [birth year] DOA: 07/17/2014  PCP: [name], MD  Admit date: 07/17/2014 Discharge date: 07/21/2014   Recommendations for Outpatient Follow-Up:   1. A PICC line was placed for IV medication administration. 2. The patient is being discharged to [facility] for end-of-life care.   Discharge Diagnosis:   Principal Problem:    Glioblastoma multiforme of brain with seizures Active Problems:    Essential hypertension    Poor social situation    Leukocytosis    Poor social situation   Discharge Condition: Stable but terminal.  Diet recommendation: Comfort feeds as tolerated   History of Present Illness:   [name] is an 80 y.o. female with a PMH of GBM status post resection 07/2013 followed by radiation therapy, currently under hospice care at home (previously resided at [facility] but was d/c'd home 07/14/14), who, according to the daughter, was not discharged with any anti-seizure medications from [facility] who was admitted on 07/17/14 with grand mal seizures.  The patient was given IV Dilantin in the ED and subsequently became hypotensive. She was admitted for seizure control.  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  No reports of seizures since admission.  MRI brain done 07/20/14, which showed a large recurrent tumor in the right frontal lobe and tumor infiltration throughout the right cerebral hemisphere with some extension into the left hemisphere.  Discharge to [facility] placed for end-of-life care.  A PICC line was placed prior to discharge for ease of IV administration of antiseizure medications.  Active Problems:  Poor social situation  Daughter, Mickel Baas (who she lives with) has been accused of neglect, SW has had to help family with  provision of food/utilities to the patient.  Asencion Partridge (other daughter) and Mickel Baas do not speak to each other.  DSS/APS involved.   Leukocytosis  Mild, no evidence of an infectious source.   Essential hypertension  Not on medication.    Medical Consultants:    None.   Discharge Exam:   Filed Vitals:   07/21/14 0635  BP: 111/53  Pulse: 63  Temp: 98.5 F (36.9 C)  Resp: 16   Filed Vitals:   07/20/14 0706 07/20/14 1440 07/20/14 2110 07/21/14 0635  BP: 118/61 112/68 111/65 111/53  Pulse: 59 60 63 63  Temp: 97.8 F (36.6 C) 97.6 F (36.4 C) 97.5 F (36.4 C) 98.5 F (36.9 C)  TempSrc: Oral Oral Oral Oral  Resp: 20 18 16 16   SpO2: 98% 98% 98% 98%    Gen:  Lethargic, disoriented Cardiovascular:  RRR, No M/R/G Respiratory: Lungs CTAB Gastrointestinal: Abdomen soft, NT/ND with normal active bowel sounds. Extremities: No C/E/C   The results of significant diagnostics from this hospitalization (including imaging, microbiology, ancillary and laboratory) are listed below for reference.     Procedures and Diagnostic Studies:   Ct Head Wo Contrast 07/17/2014: Extensive white matter disease bilaterally. This is most pronounced in the right frontal lobe in the area prior surgery and previous brain lesion. I cannot exclude a recurrent or residual lesion in the right frontal lobe on this unenhanced study. Consider further evaluation with MRI.    Mr Jeri Cos Wo Contrast 07/20/2014: Large recurrent tumor right frontal lobe compatible with glioblastoma recurrence. Large amount of white matter edema/ tumor infiltration throughout the right cerebral hemisphere with some extension into the left hemisphere. No enhancing  mass in the left hemisphere.     Labs:   Basic Metabolic Panel:  Recent Labs Lab 07/17/14 1741 07/18/14 0506  NA 136* 135*  K 4.4 4.1  CL 99 99  CO2 24 20  GLUCOSE 84 110*  BUN 23 22  CREATININE 1.26* 1.07  CALCIUM 9.8 9.3   GFR CrCl cannot be  calculated (Unknown ideal weight.). Liver Function Tests:  Recent Labs Lab 07/18/14 0506  AST 18  ALT 18  ALKPHOS 79  BILITOT 0.9  PROT 6.2  ALBUMIN 3.1*   CBC:  Recent Labs Lab 07/17/14 1741 07/18/14 0506  WBC 11.0* 11.3*  NEUTROABS 8.7*  --   HGB 10.2* 9.7*  HCT 30.5* 28.8*  MCV 97.8 97.3  PLT 198 191   Discharge Instructions:   Discharge Instructions    Call MD for:  persistant nausea and vomiting    Complete by:  As directed      Call MD for:  severe uncontrolled pain    Complete by:  As directed      Call MD for:  temperature >100.4    Complete by:  As directed      Diet general    Complete by:  As directed      Increase activity slowly    Complete by:  As directed             Medication List    STOP taking these medications        acetaminophen 325 MG tablet  Commonly known as:  TYLENOL  Replaced by:  acetaminophen 650 MG suppository     ALPRAZolam 0.25 MG tablet  Commonly known as:  XANAX     ALPRAZolam 0.5 MG tablet  Commonly known as:  XANAX     dexamethasone 4 MG tablet  Commonly known as:  DECADRON     guaifenesin 100 MG/5ML syrup  Commonly known as:  ROBITUSSIN     LORazepam 2 MG/ML concentrated solution  Commonly known as:  ATIVAN  Replaced by:  LORazepam 2 MG/ML injection     phenytoin 100 MG ER capsule  Commonly known as:  DILANTIN     prochlorperazine 10 MG tablet  Commonly known as:  COMPAZINE     senna-docusate 8.6-50 MG per tablet  Commonly known as:  Senokot-S      TAKE these medications        acetaminophen 650 MG suppository  Commonly known as:  TYLENOL  Place 1 suppository (650 mg total) rectally every 6 (six) hours as needed for mild pain (or Fever >/= 101).     aluminum-magnesium hydroxide-simethicone 409-811-91 MG/5ML Susp  Commonly known as:  MAALOX  Take 30 mLs by mouth 4 (four) times daily as needed (for heartburn).     bisacodyl 10 MG suppository  Commonly known as:  DULCOLAX  Place 1 suppository  (10 mg total) rectally as needed for moderate constipation.     dexamethasone 4 MG/ML injection  Commonly known as:  DECADRON  Inject 0.5 mLs (2 mg total) into the vein every 12 (twelve) hours.     HYDROcodone-acetaminophen 5-325 MG per tablet  Commonly known as:  NORCO/VICODIN  Take 1 tablet by mouth every 6 (six) hours as needed for severe pain. for pain     levETIRAcetam 500 MG/100ML Soln  Commonly known as:  KEPRRA  Inject 100 mLs (500 mg total) into the vein every 12 (twelve) hours.     LORazepam 2 MG/ML injection  Commonly known as:  ATIVAN  Inject 0.5 mLs (1 mg total) into the vein every 6 (six) hours as needed (agitation).     omeprazole 20 MG capsule  Commonly known as:  PRILOSEC  Take 20 mg by mouth daily.     ondansetron 4 MG/2ML Soln injection  Commonly known as:  ZOFRAN  Inject 2 mLs (4 mg total) into the vein every 6 (six) hours as needed for nausea.     PREPARATION H 0.25-3-85.5 % suppository  Generic drug:  shark liver oil-cocoa butter  Place 1 suppository rectally 4 (four) times daily as needed for hemorrhoids.           Follow-up Information    Follow up with [name], MD.   Specialty:  Internal Medicine   Why:  As needed   Contact information:   [address] [place] [address] [phone]        Time coordinating discharge: 25 minutes.  Signed:  RAMA,CHRISTINA  Pager [phone] [facility] 07/21/2014, 10:29 AM

## 2014-07-21 NOTE — Progress Notes (Signed)
Report called to Rubin Payor, RN at [facility]. Pt to be transported via [employer] [facility]

## 2014-07-21 NOTE — Progress Notes (Signed)
Inpatient RN visit- [place] 3W Room 1345-[facility] RN Visit-Karen Alford Highland RN Related admission to [facility] diagnosis of Malignant Neoplasm of cerebrum except lobes and ventricles. Pt is DNR code. Pt seen at bedside, eyes closed, easily roused by voice, remains slow to respond, oriented to self and location. She denied pain, reports poor sleep last night, but is unsure why. Per chart review she did eat 90% of her breakfast this am. Per conversation with staff aide, pt had to be fed and did not attempt to feed herself, nor hold her cup. She remains on IV Keppra and IV decadron for management of seizures.  Current plan is for patient to discharge to [facility]) today via EMS after PICC line placement . [facility] MSW Marilynne Halsted has been in touch via phone with patient's daughter Asencion Partridge who is agreeable to PICC line and [facility] placement.  Patient's home medication list,transfer summary and OOF DNR in place on shadow chart.  Please call [facility] @ [phone]- ask for RN Liaison or after hours,ask for on-call RN with any hospice needs.   Thank you. Tracey Harries, RN  [facility]  Hospice Liaison  [phone])

## 2014-09-15 ENCOUNTER — Telehealth: Payer: Self-pay

## 2014-09-15 NOTE — Telephone Encounter (Signed)
Faxed copy signed to return thanks

## 2014-09-15 NOTE — Telephone Encounter (Signed)
Crematorycontacted Fax sent.

## 2014-09-27 DEATH — deceased
